# Patient Record
Sex: Male | Born: 1945
Health system: Southern US, Community
[De-identification: ages and names within clinical notes are randomized; demographics above are authoritative.]

## PROBLEM LIST (undated history)

## (undated) ENCOUNTER — Emergency Department: Payer: No Typology Code available for payment source

## (undated) DIAGNOSIS — I4891 Unspecified atrial fibrillation: Secondary | ICD-10-CM

## (undated) DIAGNOSIS — I509 Heart failure, unspecified: Secondary | ICD-10-CM

## (undated) DIAGNOSIS — J449 Chronic obstructive pulmonary disease, unspecified: Secondary | ICD-10-CM

## (undated) DIAGNOSIS — Z951 Presence of aortocoronary bypass graft: Secondary | ICD-10-CM

## (undated) DIAGNOSIS — K7460 Unspecified cirrhosis of liver: Secondary | ICD-10-CM

## (undated) DIAGNOSIS — I10 Essential (primary) hypertension: Secondary | ICD-10-CM

---

## 2007-12-04 ENCOUNTER — Emergency Department: Payer: Self-pay | Admitting: Emergency Medicine

## 2007-12-04 ENCOUNTER — Other Ambulatory Visit: Payer: Self-pay

## 2008-09-25 ENCOUNTER — Encounter: Payer: Self-pay | Admitting: Internal Medicine

## 2008-09-25 LAB — CONVERTED CEMR LAB
Cholesterol: 155 mg/dL
HDL: 60 mg/dL
LDL Cholesterol: 83 mg/dL

## 2008-12-11 ENCOUNTER — Encounter: Payer: Self-pay | Admitting: Internal Medicine

## 2008-12-11 LAB — CONVERTED CEMR LAB
Creatinine, Ser: 1 mg/dL
Glucose, Bld: 101 mg/dL
HCT: 42.1 %
Hemoglobin: 14.1 g/dL
Platelets: 137 10*3/uL
WBC: 5.2 10*3/uL

## 2009-05-07 ENCOUNTER — Emergency Department: Payer: Self-pay | Admitting: Emergency Medicine

## 2009-09-08 ENCOUNTER — Ambulatory Visit: Payer: Self-pay | Admitting: Internal Medicine

## 2009-09-08 ENCOUNTER — Inpatient Hospital Stay: Payer: Self-pay | Admitting: Internal Medicine

## 2010-02-12 ENCOUNTER — Encounter: Payer: Self-pay | Admitting: Internal Medicine

## 2010-02-12 DIAGNOSIS — E785 Hyperlipidemia, unspecified: Secondary | ICD-10-CM | POA: Insufficient documentation

## 2010-02-12 DIAGNOSIS — I10 Essential (primary) hypertension: Secondary | ICD-10-CM | POA: Insufficient documentation

## 2013-05-03 ENCOUNTER — Emergency Department: Payer: Self-pay | Admitting: Emergency Medicine

## 2013-05-03 LAB — CK TOTAL AND CKMB (NOT AT ARMC)
CK, Total: 199 U/L (ref 35–232)
CK, Total: 173 U/L (ref 35–232)
CK-MB: 3.7 ng/mL — ABNORMAL HIGH (ref 0.5–3.6)
CK-MB: 3.2 ng/mL (ref 0.5–3.6)

## 2013-05-03 LAB — PROTIME-INR: Prothrombin Time: 14.2 secs (ref 11.5–14.7)

## 2013-05-03 LAB — CBC
RBC: 3.91 10*6/uL — ABNORMAL LOW (ref 4.40–5.90)
RDW: 13 % (ref 11.5–14.5)

## 2013-05-03 LAB — BASIC METABOLIC PANEL
BUN: 8 mg/dL (ref 7–18)
Co2: 27 mmol/L (ref 21–32)
Creatinine: 0.88 mg/dL (ref 0.60–1.30)
EGFR (Non-African Amer.): >60
Osmolality: 265 (ref 275–301)

## 2013-05-03 LAB — APTT: Activated PTT: 33 secs (ref 23.6–35.9)

## 2015-06-17 ENCOUNTER — Encounter: Payer: Self-pay | Admitting: Adult Health

## 2015-06-17 ENCOUNTER — Emergency Department: Payer: Medicare Other

## 2015-06-17 ENCOUNTER — Emergency Department
Admission: EM | Admit: 2015-06-17 | Discharge: 2015-06-17 | Disposition: A | Discharge status: Home / Self Care | Payer: Medicare Other | Attending: Emergency Medicine | Admitting: Emergency Medicine

## 2015-06-17 DIAGNOSIS — Z7982 Long term (current) use of aspirin: Secondary | ICD-10-CM | POA: Insufficient documentation

## 2015-06-17 DIAGNOSIS — J209 Acute bronchitis, unspecified: Secondary | ICD-10-CM | POA: Diagnosis not present

## 2015-06-17 DIAGNOSIS — I10 Essential (primary) hypertension: Secondary | ICD-10-CM | POA: Diagnosis not present

## 2015-06-17 DIAGNOSIS — Z79899 Other long term (current) drug therapy: Secondary | ICD-10-CM | POA: Diagnosis not present

## 2015-06-17 DIAGNOSIS — Z7902 Long term (current) use of antithrombotics/antiplatelets: Secondary | ICD-10-CM | POA: Insufficient documentation

## 2015-06-17 DIAGNOSIS — R0789 Other chest pain: Secondary | ICD-10-CM

## 2015-06-17 DIAGNOSIS — R079 Chest pain, unspecified: Secondary | ICD-10-CM | POA: Diagnosis present

## 2015-06-17 HISTORY — DX: Essential (primary) hypertension: I10

## 2015-06-17 HISTORY — DX: Presence of aortocoronary bypass graft: Z95.1

## 2015-06-17 HISTORY — DX: Heart failure, unspecified: I50.9

## 2015-06-17 LAB — HEPATIC FUNCTION PANEL
ALT: 36 U/L (ref 17–63)
AST: 45 U/L — ABNORMAL HIGH (ref 15–41)
Albumin: 4 g/dL (ref 3.5–5.0)
Alkaline Phosphatase: 64 U/L (ref 38–126)
BILIRUBIN INDIRECT: 1 mg/dL — AB (ref 0.3–0.9)
Bilirubin, Direct: 0.2 mg/dL (ref 0.1–0.5)
TOTAL PROTEIN: 7.5 g/dL (ref 6.5–8.1)
Total Bilirubin: 1.2 mg/dL (ref 0.3–1.2)

## 2015-06-17 LAB — TROPONIN I: Troponin I: <0.03 ng/mL (ref <0.031)

## 2015-06-17 LAB — PROTIME-INR
INR: 1.08
PROTHROMBIN TIME: 14.2 s (ref 11.4–15.0)

## 2015-06-17 LAB — CBC
HCT: 43.5 % (ref 40.0–52.0)
Hemoglobin: 14.8 g/dL (ref 13.0–18.0)
MCH: 36 pg — ABNORMAL HIGH (ref 26.0–34.0)
MCHC: 33.9 g/dL (ref 32.0–36.0)
MCV: 106.2 fL — ABNORMAL HIGH (ref 80.0–100.0)
PLATELETS: 151 10*3/uL (ref 150–440)
RBC: 4.1 MIL/uL — AB (ref 4.40–5.90)
RDW: 13 % (ref 11.5–14.5)
WBC: 6.4 10*3/uL (ref 3.8–10.6)

## 2015-06-17 LAB — BASIC METABOLIC PANEL
Anion gap: 6 (ref 5–15)
BUN: 8 mg/dL (ref 6–20)
CALCIUM: 9.1 mg/dL (ref 8.9–10.3)
CHLORIDE: 98 mmol/L — AB (ref 101–111)
CO2: 26 mmol/L (ref 22–32)
CREATININE: 0.86 mg/dL (ref 0.61–1.24)
Glucose, Bld: 102 mg/dL — ABNORMAL HIGH (ref 65–99)
Potassium: 4.1 mmol/L (ref 3.5–5.1)
SODIUM: 130 mmol/L — AB (ref 135–145)

## 2015-06-17 LAB — MAGNESIUM: MAGNESIUM: 1.9 mg/dL (ref 1.7–2.4)

## 2015-06-17 MED ORDER — ASPIRIN 81 MG PO CHEW
243.0000 mg | CHEWABLE_TABLET | Freq: Once | ORAL | Status: AC
  Administered 2015-06-17: 243 mg via ORAL
  Filled 2015-06-17: qty 3

## 2015-06-17 MED ORDER — PREDNISONE 20 MG PO TABS: 40.0000 mg | ORAL_TABLET | Freq: Every day | ORAL | Status: DC

## 2015-06-17 NOTE — ED Provider Notes (Signed)
[facility] Emergency Department Provider Note  ____________________________________________  Time seen: Approximately 4:16 AM  I have reviewed the triage vital signs and the nursing notes.   HISTORY  Chief Complaint Chest Pain and Hypertension    HPI Kenneth Stevenson is a 69 y.o. male with an extensive cardiac history and who has outpatient primary care and cardiologist whose most recent stents/catheterization occurred about 2 years ago presents with substernal chest pressure that began several hours ago after eating.  He states it radiates into the left side of his chest and that he has had some nausea and shortness of breath associated with the pain.  He noticed that he was hypertensive at home when he took some nitroglycerin as well as his regular lisinopril.  He feels better at this time with very minimal if any symptoms.  He describes his chest discomfort as moderate, nothing that it better and nothing made it worse.  It was a gradual onset.  He also endorses a recent cough and congestion.He has never smoked and does not have a diagnosis of COPD.  He denies fever/chills, abdominal pain, dysuria.   Past Medical History  Diagnosis Date  . S/P CABG x 3   . Hypertension   . CHF (congestive heart failure) [facility])     Patient Active Problem List   Diagnosis Date Noted  . HYPERLIPIDEMIA-MIXED 02/12/2010  . HYPERTENSION, UNSPECIFIED 02/12/2010    History reviewed. No pertinent past surgical history.  Current Outpatient Rx  Name  Route  Sig  Dispense  Refill  . aspirin 81 MG tablet   Oral   Take 81 mg by mouth daily.         . carvedilol (COREG) 12.5 MG tablet   Oral   Take 12.5 mg by mouth 2 (two) times daily with a meal.         . clopidogrel (PLAVIX) 75 MG tablet   Oral   Take 75 mg by mouth daily.         Marland Kitchen lisinopril (PRINIVIL,ZESTRIL) 40 MG tablet   Oral   Take 40 mg by mouth daily.         . pantoprazole (PROTONIX) 40 MG tablet  Oral   Take 40 mg by mouth daily.         Marland Kitchen pyridOXINE (VITAMIN B-6) 100 MG tablet   Oral   Take 100 mg by mouth daily.         Marland Kitchen Specialty Vitamins Products (MAGNESIUM, AMINO ACID CHELATE,) 133 MG tablet   Oral   Take 1 tablet by mouth 2 (two) times daily.         . vitamin B-12 (CYANOCOBALAMIN) 1000 MCG tablet   Oral   Take 1,000 mcg by mouth daily.         . predniSONE (DELTASONE) 20 MG tablet   Oral   Take 2 tablets (40 mg total) by mouth daily.   10 tablet   0     Allergies Simvastatin  History reviewed. No pertinent family history.  Social History Social History  Substance Use Topics  . Smoking status: Never Smoker   . Smokeless tobacco: None  . Alcohol Use: Yes    Review of Systems Constitutional: No fever/chills Eyes: No visual changes. ENT: No sore throat. Cardiovascular: Substernal chest pressure that started several hours ago Respiratory: Mild shortness of breath, now resolved.  Recent cough. Gastrointestinal: No abdominal pain.  No nausea, no vomiting.  No diarrhea.  No constipation. Genitourinary: Negative for  dysuria. Musculoskeletal: Negative for back pain. Skin: Negative for rash. Neurological: Negative for headaches, focal weakness or numbness.  10-point ROS otherwise negative.  ____________________________________________   PHYSICAL EXAM:  VITAL SIGNS: ED Triage Vitals  Enc Vitals Group     BP 06/17/15 0037 204/85 mmHg     Pulse Rate 06/17/15 0037 78     Resp 06/17/15 0037 18     Temp 06/17/15 0037 98.1 F (36.7 C)     Temp Source 06/17/15 0037 Oral     SpO2 06/17/15 0037 95 %     Weight 06/17/15 0037 185 lb (83.915 kg)     Height 06/17/15 0037 5\' 11"  (1.803 m)     Head Cir --      Peak Flow --      Pain Score 06/17/15 0051 8     Pain Loc --      Pain Edu? --      Excl. in [place]? --     Constitutional: Alert and oriented. Well appearing and in no acute distress. Eyes: Conjunctivae are normal. PERRL. EOMI. Head:  Atraumatic. Nose: No congestion/rhinnorhea. Mouth/Throat: Mucous membranes are moist.  Oropharynx non-erythematous. Neck: No stridor.   Cardiovascular: Normal rate, regular rhythm. Grossly normal heart sounds.  Good peripheral circulation. Respiratory: Normal respiratory effort.  No retractions. Lungs CTAB. Gastrointestinal: Soft and nontender. No distention. No abdominal bruits. No CVA tenderness. Musculoskeletal: No lower extremity tenderness nor edema.  No joint effusions. Neurologic:  Normal speech and language. No gross focal neurologic deficits are appreciated.  Skin:  Skin is warm, dry and intact. No rash noted. Psychiatric: Mood and affect are normal. Speech and behavior are normal.  ____________________________________________   LABS (all labs ordered are listed, but only abnormal results are displayed)  Labs Reviewed  BASIC METABOLIC PANEL - Abnormal; Notable for the following:    Sodium 130 (*)    Chloride 98 (*)    Glucose, Bld 102 (*)    All other components within normal limits  CBC - Abnormal; Notable for the following:    RBC 4.10 (*)    MCV 106.2 (*)    MCH 36.0 (*)    All other components within normal limits  HEPATIC FUNCTION PANEL - Abnormal; Notable for the following:    AST 45 (*)    Indirect Bilirubin 1.0 (*)    All other components within normal limits  TROPONIN I  PROTIME-INR  MAGNESIUM  TROPONIN I   ____________________________________________  EKG  ED ECG REPORT I, Deepti Gunawan, the attending physician, personally viewed and interpreted this ECG.  Date: 06/17/2015 EKG Time: 00:31 Rate: 75 Rhythm: normal sinus rhythm QRS Axis: normal Intervals: normal ST/T Wave abnormalities: normal Conduction Disutrbances: none Narrative Interpretation: unremarkable  ____________________________________________  RADIOLOGY   Dg Chest 2 View  06/17/2015  CLINICAL DATA:  Sternal chest pressure. Nausea and shortness of breath. Onset 6 hours prior.  EXAM: CHEST  2 VIEW COMPARISON:  05/03/2013 FINDINGS: Patient is post median sternotomy. The heart size is normal, there is unchanged tortuosity of thoracic aorta. Lungs are hyperinflated. There is new bronchial thickening. No pulmonary edema. No consolidation, pleural effusion or pneumothorax. No acute osseous abnormalities. IMPRESSION: Bronchial thickening, suggesting acute bronchitis. Electronically Signed   By: Jeb Levering M.D.   On: 06/17/2015 01:19    ____________________________________________   PROCEDURES  Procedure(s) performed: None  Critical Care performed: No ____________________________________________   INITIAL IMPRESSION / ASSESSMENT AND PLAN / ED COURSE  Pertinent labs & imaging results that were  available during my care of the patient were reviewed by me and considered in my medical decision making (see chart for details).  The patient has a substantial cardiac history but is well-appearing at this time and asymptomatic.  He has had relatively recent workups including a catheterization within the last couple of years.  His EKG is unremarkable and he has had 2 negative troponins.  I discussed with the patient the possibility of being transferred to the [facility] for chest pain observation or that he can go home with follow-up with his cardiologist as an outpatient, and he prefers to do the latter which I think is appropriate under the circumstances.  I gave the patient and his wife my usual customary return precautions and they understand and agree with the plan.  I am treating his acute bronchitis with some prednisone but avoiding antibiotic therapy given the unlikelihood that he has a bacterial infection that would benefit from antibiotics.  He already has albuterol at home.  ____________________________________________  FINAL CLINICAL IMPRESSION(S) / ED DIAGNOSES  Final diagnoses:  Atypical chest pain  Acute bronchitis, unspecified organism      NEW MEDICATIONS  STARTED DURING THIS VISIT:  Discharge Medication List as of 06/17/2015  4:52 AM    START taking these medications   Details  predniSONE (DELTASONE) 20 MG tablet Take 2 tablets (40 mg total) by mouth daily., Starting 06/17/2015, Until Discontinued, Print         Hinda Kehr, MD 06/17/15 [phone]

## 2015-06-17 NOTE — Discharge Instructions (Signed)
You have been seen in the Emergency Department (ED) today for chest pain.  As we have discussed todays test results are normal, but you may require further testing.  We are also giving you a short course of steroids for your bronchitis.  Continue using all your regular medications as well.  Please follow up with the recommended doctor as instructed above in these documents regarding todays emergent visit and your recent symptoms to discuss further management.  Continue to take your regular medications. If you are not doing so already, please also take a daily baby aspirin (81 mg), at least until you follow up with your doctor.  Return to the Emergency Department (ED) if you experience any further chest pain/pressure/tightness, difficulty breathing, or sudden sweating, or other symptoms that concern you.   Chest Pain (Nonspecific) It is often hard to give a specific diagnosis for the cause of chest pain. There is always a chance that your pain could be related to something serious, such as a heart attack or a blood clot in the lungs. You need to follow up with your health care provider for further evaluation. CAUSES   Heartburn.  Pneumonia or bronchitis.  Anxiety or stress.  Inflammation around your heart (pericarditis) or lung (pleuritis or pleurisy).  A blood clot in the lung.  A collapsed lung (pneumothorax). It can develop suddenly on its own (spontaneous pneumothorax) or from trauma to the chest.  Shingles infection (herpes zoster virus). The chest wall is composed of bones, muscles, and cartilage. Any of these can be the source of the pain.  The bones can be bruised by injury.  The muscles or cartilage can be strained by coughing or overwork.  The cartilage can be affected by inflammation and become sore (costochondritis). DIAGNOSIS  Lab tests or other studies may be needed to find the cause of your pain. Your health care provider may have you take a test called an ambulatory  electrocardiogram (ECG). An ECG records your heartbeat patterns over a 24-hour period. You may also have other tests, such as:  Transthoracic echocardiogram (TTE). During echocardiography, sound waves are used to evaluate how blood flows through your heart.  Transesophageal echocardiogram (TEE).  Cardiac monitoring. This allows your health care provider to monitor your heart rate and rhythm in real time.  Holter monitor. This is a portable device that records your heartbeat and can help diagnose heart arrhythmias. It allows your health care provider to track your heart activity for several days, if needed.  Stress tests by exercise or by giving medicine that makes the heart beat faster. TREATMENT   Treatment depends on what may be causing your chest pain. Treatment may include:  Acid blockers for heartburn.  Anti-inflammatory medicine.  Pain medicine for inflammatory conditions.  Antibiotics if an infection is present.  You may be advised to change lifestyle habits. This includes stopping smoking and avoiding alcohol, caffeine, and chocolate.  You may be advised to keep your head raised (elevated) when sleeping. This reduces the chance of acid going backward from your stomach into your esophagus. Most of the time, nonspecific chest pain will improve within 2-3 days with rest and mild pain medicine.  HOME CARE INSTRUCTIONS   If antibiotics were prescribed, take them as directed. Finish them even if you start to feel better.  For the next few days, avoid physical activities that bring on chest pain. Continue physical activities as directed.  Do not use any tobacco products, including cigarettes, chewing tobacco, or electronic cigarettes.  Avoid drinking alcohol.  Only take medicine as directed by your health care provider.  Follow your health care provider's suggestions for further testing if your chest pain does not go away.  Keep any follow-up appointments you made. If you do  not go to an appointment, you could develop lasting (chronic) problems with pain. If there is any problem keeping an appointment, call to reschedule. SEEK MEDICAL CARE IF:   Your chest pain does not go away, even after treatment.  You have a rash with blisters on your chest.  You have a fever. SEEK IMMEDIATE MEDICAL CARE IF:   You have increased chest pain or pain that spreads to your arm, neck, jaw, back, or abdomen.  You have shortness of breath.  You have an increasing cough, or you cough up blood.  You have severe back or abdominal pain.  You feel nauseous or vomit.  You have severe weakness.  You faint.  You have chills. This is an emergency. Do not wait to see if the pain will go away. Get medical help at once. Call your local emergency services (911 in [place] Do not drive yourself to the hospital. MAKE SURE YOU:   Understand these instructions.  Will watch your condition.  Will get help right away if you are not doing well or get worse. Document Released: 05/21/2005 Document Revised: 08/16/2013 Document Reviewed: 03/16/2008 [facility] Patient Information 2015 [place], [place]. This information is not intended to replace advice given to you by your health care provider. Make sure you discuss any questions you have with your health care provider.

## 2015-06-17 NOTE — ED Notes (Addendum)
Presents with sternal chest pressure began at 5 pm after eating radiating into left side of chest, associated with nausea, SOB and slight dizziness. Pt is hypertensive 200s/80s. Alert, oriented. Chest pain described as congested at this time and worse with breathing.  Endorses cough. Took 1 nitro at home with mild relief of symptoms also took lisinopril.

## 2015-11-13 ENCOUNTER — Encounter: Payer: Self-pay | Admitting: Emergency Medicine

## 2015-11-13 DIAGNOSIS — Z7902 Long term (current) use of antithrombotics/antiplatelets: Secondary | ICD-10-CM | POA: Diagnosis not present

## 2015-11-13 DIAGNOSIS — Z79899 Other long term (current) drug therapy: Secondary | ICD-10-CM | POA: Insufficient documentation

## 2015-11-13 DIAGNOSIS — R21 Rash and other nonspecific skin eruption: Secondary | ICD-10-CM | POA: Diagnosis present

## 2015-11-13 DIAGNOSIS — L42 Pityriasis rosea: Secondary | ICD-10-CM | POA: Insufficient documentation

## 2015-11-13 DIAGNOSIS — Z7982 Long term (current) use of aspirin: Secondary | ICD-10-CM | POA: Diagnosis not present

## 2015-11-13 DIAGNOSIS — I10 Essential (primary) hypertension: Secondary | ICD-10-CM | POA: Insufficient documentation

## 2015-11-13 NOTE — ED Notes (Addendum)
Pt presents to ED with itchy rash to his extremities and his trunk; onset around 3 weeks ago. Pt states the rash has worsened since onset. Seen by the [facility] cardiologist last week and pt was told to show rash to his pcp. Pt concerned by how rapidly the rash is worsening. Denies fever. Pt reports he had recently taken steroids for his cough (approx 1 month ago) but denies any other new medications or antibiotics. Has been applying benadryl topically but pt states it only helps for a little while. Denies any other symptoms.

## 2015-11-14 ENCOUNTER — Emergency Department
Admission: EM | Admit: 2015-11-14 | Discharge: 2015-11-14 | Disposition: A | Discharge status: Home / Self Care | Payer: Medicare Other | Attending: Emergency Medicine | Admitting: Emergency Medicine

## 2015-11-14 DIAGNOSIS — L42 Pityriasis rosea: Secondary | ICD-10-CM

## 2015-11-14 MED ORDER — DIPHENHYDRAMINE HCL 25 MG PO CAPS
50.0000 mg | ORAL_CAPSULE | Freq: Once | ORAL | Status: AC
  Administered 2015-11-14: 50 mg via ORAL
  Filled 2015-11-14: qty 2

## 2015-11-14 MED ORDER — PREDNISONE 20 MG PO TABS
60.0000 mg | ORAL_TABLET | Freq: Once | ORAL | Status: AC
  Administered 2015-11-14: 60 mg via ORAL
  Filled 2015-11-14: qty 3

## 2015-11-14 MED ORDER — CEPHALEXIN 500 MG PO CAPS: 500.0000 mg | ORAL_CAPSULE | Freq: Two times a day (BID) | ORAL | Status: AC

## 2015-11-14 MED ORDER — CEPHALEXIN 500 MG PO CAPS
500.0000 mg | ORAL_CAPSULE | Freq: Once | ORAL | Status: AC
  Administered 2015-11-14: 500 mg via ORAL
  Filled 2015-11-14: qty 1

## 2015-11-14 MED ORDER — PREDNISONE 20 MG PO TABS: 60.0000 mg | ORAL_TABLET | Freq: Every day | ORAL | Status: AC

## 2015-11-14 NOTE — ED Provider Notes (Signed)
[facility] Emergency Department Provider Note  ____________________________________________  Time seen: 2:15 AM  I have reviewed the triage vital signs and the nursing notes.   HISTORY  Chief Complaint Rash     HPI Kenneth Stevenson is a 70 y.o. male  presents with generalize pruritic rash 3 weeks with worsening over the past 2 weeks. Patient denies any fever patient was seen at the [facility] and referred to his primary care provider but does not have an appointment until the 28th of this month. Patient admits to using topical Benadryl cream with improvement however not sustained.    Past Medical History  Diagnosis Date  . S/P CABG x 3   . Hypertension   . CHF (congestive heart failure) [facility])     Patient Active Problem List   Diagnosis Date Noted  . HYPERLIPIDEMIA-MIXED 02/12/2010  . HYPERTENSION, UNSPECIFIED 02/12/2010    No past surgical history on file.  Current Outpatient Rx  Name  Route  Sig  Dispense  Refill  . aspirin 81 MG tablet   Oral   Take 81 mg by mouth daily.         . carvedilol (COREG) 12.5 MG tablet   Oral   Take 12.5 mg by mouth 2 (two) times daily with a meal.         . clopidogrel (PLAVIX) 75 MG tablet   Oral   Take 75 mg by mouth daily.         Marland Kitchen lisinopril (PRINIVIL,ZESTRIL) 40 MG tablet   Oral   Take 40 mg by mouth daily.         . predniSONE (DELTASONE) 20 MG tablet   Oral   Take 2 tablets (40 mg total) by mouth daily.   10 tablet   0   . pyridOXINE (VITAMIN B-6) 100 MG tablet   Oral   Take 100 mg by mouth daily.         Marland Kitchen Specialty Vitamins Products (MAGNESIUM, AMINO ACID CHELATE,) 133 MG tablet   Oral   Take 1 tablet by mouth 2 (two) times daily.         . vitamin B-12 (CYANOCOBALAMIN) 1000 MCG tablet   Oral   Take 1,000 mcg by mouth daily.           Allergies Simvastatin  No family history on file.  Social History Social History  Substance Use Topics  . Smoking status: Never Smoker    . Smokeless tobacco: Never Used  . Alcohol Use: Yes    Review of Systems  Constitutional: Negative for fever. Eyes: Negative for visual changes. ENT: Negative for sore throat. Cardiovascular: Negative for chest pain. Respiratory: Negative for shortness of breath. Gastrointestinal: Negative for abdominal pain, vomiting and diarrhea. Genitourinary: Negative for dysuria. Musculoskeletal: Negative for back pain. Skinpositive rash. Neurological: Negative for headaches, focal weakness or numbness.   10-point ROS otherwise negative.  ____________________________________________   PHYSICAL EXAM:  VITAL SIGNS: ED Triage Vitals  Enc Vitals Group     BP 11/13/15 2308 183/82 mmHg     Pulse Rate 11/13/15 2308 82     Resp 11/13/15 2308 17     Temp 11/13/15 2308 98 F (36.7 C)     Temp Source 11/13/15 2308 Oral     SpO2 11/13/15 2308 95 %     Weight 11/13/15 2308 185 lb (83.915 kg)     Height 11/13/15 2308 5\' 11"  (1.803 m)     Head Cir --  Peak Flow --      Pain Score 11/13/15 2308 2     Pain Loc --      Pain Edu? --      Excl. in [place]? --      Constitutional: Alert and oriented. Well appearing and in no distress. Eyes: Conjunctivae are normal. PERRL. Normal extraocular movements. ENT   Head: Normocephalic and atraumatic.   Nose: No congestion/rhinnorhea.   Mouth/Throat: Mucous membranes are moist.   Neck: No stridor. Hematological/Lymphatic/Immunilogical: No cervical lymphadenopathy. Cardiovascular: Normal rate, regular rhythm. Normal and symmetric distal pulses are present in all extremities. No murmurs, rubs, or gallops. Respiratory: Normal respiratory effort without tachypnea nor retractions. Breath sounds are clear and equal bilaterally. No wheezes/rales/rhonchi. Gastrointestinal: Soft and nontender. No distention. There is no CVA tenderness. Genitourinary: deferred Musculoskeletal: Nontender with normal range of motion in all extremities. No joint  effusions.  No lower extremity tenderness nor edema. Neurologic:  Normal speech and language. No gross focal neurologic deficits are appreciated. Speech is normal.  Skin:  generalized plaque-like rash involving trunk and bilateral extremities herald patch noted right inner thigh evidence of excoriation diffusely  Psychiatric: Mood and affect are normal. Speech and behavior are normal. Patient exhibits appropriate insight and judgment.    INITIAL IMPRESSION / ASSESSMENT AND PLAN / ED COURSE  Pertinent labs & imaging results that were available during my care of the patient were reviewed by me and considered in my medical decision making (see chart for details).  History physical exam concerning for possible pityriasis rosacea given pruritus patient given prednisone 60 mg as well as Benadryl 50 mg will be prescribed same for home patient will be referred to Dr. Annett Fabian dermatologist for outpatient follow-up   ____________________________________________   FINAL CLINICAL IMPRESSION(S) / ED DIAGNOSES  Final diagnoses:  Pityriasis rosea      Gregor Hams, MD 11/14/15 [phone]

## 2015-11-14 NOTE — Discharge Instructions (Signed)
Pityriasis Rosea  Pityriasis rosea is a rash that usually appears on the trunk of the body. It may also appear on the upper arms and upper legs. It usually begins as a single patch, and then more patches begin to develop. The rash may cause mild itching, but it normally does not cause other problems. It usually goes away without treatment. However, it may take weeks or months for the rash to go away completely.  CAUSES  The cause of this condition is not known. The condition does not spread from person to person (is noncontagious).  RISK FACTORS  This condition is more likely to develop in young adults and children. It is most common in the spring and fall.  SYMPTOMS  The main symptom of this condition is a rash.  · The rash usually begins with a single oval patch that is larger than the ones that follow. This is called a herald patch. It generally appears a week or more before the rest of the rash appears.  · When more patches start to develop, they spread quickly on the trunk, back, and arms. These patches are smaller than the first one.  · The patches that make up the rash are usually oval-shaped and pink or red in color. They are usually flat, but they may sometimes be raised so that they can be felt with a finger. They may also be finely crinkled and have a scaly ring around the edge.  · The rash does not typically appear on areas of the skin that are exposed to the sun.  Most people who have this condition do not have other symptoms, but some have mild itching. In a few cases, a mild headache or body aches may occur before the rash appears and then go away.  DIAGNOSIS  Your health care provider may diagnose this condition by doing a physical exam and taking your medical history. To rule out other possible causes for the rash, the health care provider may order blood tests or take a skin sample from the rash to be looked at under a microscope.  TREATMENT  Usually, treatment is not needed for this condition. The  rash will probably go away on its own in 4-8 weeks. In some cases, a health care provider may recommend or prescribe medicine to reduce itching.  HOME CARE INSTRUCTIONS  · Take medicines only as directed by your health care provider.  · Avoid scratching the affected areas of skin.  · Do not take hot baths or use a sauna. Use only warm water when bathing or showering. Heat can increase itching.  SEEK MEDICAL CARE IF:  · Your rash does not go away in 8 weeks.  · Your rash gets much worse.  · You have a fever.  · You have swelling or pain in the rash area.  · You have fluid, blood, or pus coming from the rash area.     This information is not intended to replace advice given to you by your health care provider. Make sure you discuss any questions you have with your health care provider.     Document Released: 09/17/2001 Document Revised: 12/26/2014 Document Reviewed: 07/19/2014  Elsevier Interactive Patient Education ©2016 [employer]

## 2016-09-13 ENCOUNTER — Emergency Department
Admission: EM | Admit: 2016-09-13 | Discharge: 2016-09-13 | Disposition: A | Discharge status: Home / Self Care | Payer: PPO | Attending: Emergency Medicine | Admitting: Emergency Medicine

## 2016-09-13 ENCOUNTER — Encounter: Payer: Self-pay | Admitting: Emergency Medicine

## 2016-09-13 DIAGNOSIS — M10062 Idiopathic gout, left knee: Secondary | ICD-10-CM | POA: Insufficient documentation

## 2016-09-13 DIAGNOSIS — M109 Gout, unspecified: Secondary | ICD-10-CM

## 2016-09-13 DIAGNOSIS — I110 Hypertensive heart disease with heart failure: Secondary | ICD-10-CM | POA: Insufficient documentation

## 2016-09-13 DIAGNOSIS — I509 Heart failure, unspecified: Secondary | ICD-10-CM | POA: Diagnosis not present

## 2016-09-13 DIAGNOSIS — Z79899 Other long term (current) drug therapy: Secondary | ICD-10-CM | POA: Diagnosis not present

## 2016-09-13 DIAGNOSIS — Z7982 Long term (current) use of aspirin: Secondary | ICD-10-CM | POA: Diagnosis not present

## 2016-09-13 DIAGNOSIS — R21 Rash and other nonspecific skin eruption: Secondary | ICD-10-CM | POA: Insufficient documentation

## 2016-09-13 DIAGNOSIS — M25562 Pain in left knee: Secondary | ICD-10-CM | POA: Diagnosis not present

## 2016-09-13 MED ORDER — COLCHICINE 0.6 MG PO TABS: ORAL_TABLET | ORAL | 0 refills | Status: DC

## 2016-09-13 MED ORDER — HYDROCODONE-ACETAMINOPHEN 5-325 MG PO TABS: 1.0000 | ORAL_TABLET | ORAL | 0 refills | Status: DC | PRN

## 2016-09-13 MED ORDER — TRIAMCINOLONE ACETONIDE 0.5 % EX OINT: 1.0000 "application " | TOPICAL_OINTMENT | Freq: Two times a day (BID) | CUTANEOUS | 0 refills | Status: DC

## 2016-09-13 NOTE — Discharge Instructions (Signed)
Follow-up with the [facility] for any continued problems with your gout and also with the rash. Begin taking colchicine as directed until relief of your gout or stomach upset. Discontinue medication if there is diarrhea. There is also a prescription for hydrocodone if needed for severe pain. Kenalog is to be used for your rash twice a day.

## 2016-09-13 NOTE — ED Notes (Signed)
Pt verbalized understanding of discharge instructions. NAD at this time. 

## 2016-09-13 NOTE — ED Triage Notes (Signed)
Reports rash on hands.  Dry patches noted.  also states his "gout is acting up", pain in left knee and right foot. Ambulates well.

## 2016-09-13 NOTE — ED Provider Notes (Signed)
[facility] Emergency Department Provider Note  ____________________________________________   First MD Initiated Contact with Patient 09/13/16 1003     (approximate)  I have reviewed the triage vital signs and the nursing notes.   HISTORY  Chief Complaint Rash and Knee Pain   HPI Kenneth Stevenson is a 71 y.o. male is here with complaint of left knee pain and a rash on his hands. Patient states that his "gout is acting up". He states that his left knee has had gout in it and he has been experiencing pain and swelling for almost a week. He states he had some pain medication that belonged to his daughter and has been taking half a tablet once a day without any relief of his knee pain. He also has dry patches on his hands and his back that have been itching. They've been using over-the-counter hand lotion without any relief of his symptoms. Eyes any fever or chills. In the past he has taken colchicine with some stomach discomfort occasionally but not always. Currently he is being seen at the [facility] but does not have an appointment for several weeks. He rates his pain as a 6 out of 10.   Past Medical History:  Diagnosis Date  . CHF (congestive heart failure) ([place])   . Hypertension   . S/P CABG x 3     Patient Active Problem List   Diagnosis Date Noted  . HYPERLIPIDEMIA-MIXED 02/12/2010  . HYPERTENSION, UNSPECIFIED 02/12/2010    History reviewed. No pertinent surgical history.  Prior to Admission medications   Medication Sig Start Date End Date Taking? Authorizing Provider  albuterol (PROVENTIL HFA;VENTOLIN HFA) 108 (90 Base) MCG/ACT inhaler Inhale 1 puff into the lungs every 6 (six) hours as needed for wheezing or shortness of breath.    Historical Provider, MD  amLODipine (NORVASC) 5 MG tablet Take 5 mg by mouth daily.    Historical Provider, MD  aspirin 81 MG tablet Take 81 mg by mouth daily.    Historical Provider, MD  atorvastatin (LIPITOR) 80 MG  tablet Take 80 mg by mouth daily.    Historical Provider, MD  carvedilol (COREG) 12.5 MG tablet Take 12.5 mg by mouth 2 (two) times daily with a meal.    Historical Provider, MD  clopidogrel (PLAVIX) 75 MG tablet Take 75 mg by mouth daily.    Historical Provider, MD  colchicine 0.6 MG tablet 2 tablets stat, then 1 tablet tid until relief or stomach upset 09/13/16 09/20/16  Johnn Hai, PA-C  HYDROcodone-acetaminophen (NORCO/VICODIN) 5-325 MG tablet Take 1 tablet by mouth every 4 (four) hours as needed for moderate pain. 09/13/16   Johnn Hai, PA-C  lisinopril (PRINIVIL,ZESTRIL) 40 MG tablet Take 40 mg by mouth daily.    Historical Provider, MD  predniSONE (DELTASONE) 20 MG tablet Take 2 tablets (40 mg total) by mouth daily. 06/17/15   Hinda Kehr, MD  pyridOXINE (VITAMIN B-6) 100 MG tablet Take 100 mg by mouth daily.    Historical Provider, MD  Specialty Vitamins Products (MAGNESIUM, AMINO ACID CHELATE,) 133 MG tablet Take 1 tablet by mouth 2 (two) times daily.    Historical Provider, MD  triamcinolone ointment (KENALOG) 0.5 % Apply 1 application topically 2 (two) times daily. 09/13/16   Johnn Hai, PA-C  vitamin B-12 (CYANOCOBALAMIN) 1000 MCG tablet Take 1,000 mcg by mouth daily.    Historical Provider, MD    Allergies Simvastatin  No family history on file.  Social History Social  History  Substance Use Topics  . Smoking status: Never Smoker  . Smokeless tobacco: Never Used  . Alcohol use Yes    Review of Systems Constitutional: No fever/chills Cardiovascular: Denies chest pain. Respiratory: Denies shortness of breath. Gastrointestinal: No abdominal pain.  No nausea, no vomiting.  No diarrhea.  Musculoskeletal: Positive for left knee pain. Skin: Positive for rash. Neurological: Negative for headaches, focal weakness or numbness.  10-point ROS otherwise negative.  ____________________________________________   PHYSICAL EXAM:  VITAL SIGNS: ED Triage Vitals    Enc Vitals Group     BP 09/13/16 0925 (!) 157/74     Pulse Rate 09/13/16 0925 78     Resp 09/13/16 0925 16     Temp 09/13/16 0925 97.7 F (36.5 C)     Temp Source 09/13/16 0925 Oral     SpO2 09/13/16 0925 94 %     Weight 09/13/16 0917 188 lb (85.3 kg)     Height 09/13/16 0917 5\' 11"  (1.803 m)     Head Circumference --      Peak Flow --      Pain Score 09/13/16 0918 6     Pain Loc --      Pain Edu? --      Excl. in [place]? --     Constitutional: Alert and oriented. Well appearing and in no acute distress. Eyes: Conjunctivae are normal. PERRL. EOMI. Head: Atraumatic. Nose: No congestion/rhinnorhea. Neck: No stridor.   Cardiovascular: Normal rate, regular rhythm. Grossly normal heart sounds.  Good peripheral circulation. Respiratory: Normal respiratory effort.  No retractions. Lungs CTAB. Musculoskeletal:Examination of left knee is moderately warm to touch and tender. There is no effusion. There is no gross deformity. Range of motion is limited secondary to pain. Neurologic:  Normal speech and language. No gross focal neurologic deficits are appreciated. No gait instability. Skin:  Skin is warm, dry and intact. Eye examination of the hands there is moderate amount of dry scaly skin. No erythema or drainage is noted. There is also diffuse patches on his back but also scaly and dry without drainage or warmth.   Psychiatric: Mood and affect are normal. Speech and behavior are normal.  ____________________________________________   LABS (all labs ordered are listed, but only abnormal results are displayed)  Labs Reviewed - No data to display   PROCEDURES  Procedure(s) performed: None  Procedures  Critical Care performed: No  ____________________________________________   INITIAL IMPRESSION / ASSESSMENT AND PLAN / ED COURSE  Pertinent labs & imaging results that were available during my care of the patient were reviewed by me and considered in my medical decision making (see  chart for details).  Patient was given a prescription for colchicine as he has taken it in the past. He'll start with 2 tablets stat and then one tablet 3 times a day until he gets relief of his knee pain or stomach upset at which time he will discontinue taking medication. He is also given a prescription for Norco as needed for pain 1 every 4 hours. Patient is also to use Kenalog 0.5% cream to the dry areas that are itching. Instructions were to use this twice a day. He will follow-up with the [facility] on his next appointment.    ___________________________________________   FINAL CLINICAL IMPRESSION(S) / ED DIAGNOSES  Final diagnoses:  Acute gout of left knee, unspecified cause  Rash and nonspecific skin eruption      NEW MEDICATIONS STARTED DURING THIS VISIT:  Discharge Medication List as  of 09/13/2016 10:42 AM    START taking these medications   Details  colchicine 0.6 MG tablet 2 tablets stat, then 1 tablet tid until relief or stomach upset, Print    HYDROcodone-acetaminophen (NORCO/VICODIN) 5-325 MG tablet Take 1 tablet by mouth every 4 (four) hours as needed for moderate pain., Starting Sat 09/13/2016, Print    triamcinolone ointment (KENALOG) 0.5 % Apply 1 application topically 2 (two) times daily., Starting Sat 09/13/2016, Print         Note:  This document was prepared using Dragon voice recognition software and may include unintentional dictation errors.    Johnn Hai, PA-C 09/13/16 [place], MD 09/13/16 [phone]

## 2016-12-09 ENCOUNTER — Encounter: Payer: Self-pay | Admitting: Emergency Medicine

## 2016-12-09 DIAGNOSIS — I110 Hypertensive heart disease with heart failure: Secondary | ICD-10-CM | POA: Diagnosis not present

## 2016-12-09 DIAGNOSIS — Y929 Unspecified place or not applicable: Secondary | ICD-10-CM | POA: Insufficient documentation

## 2016-12-09 DIAGNOSIS — Y999 Unspecified external cause status: Secondary | ICD-10-CM | POA: Insufficient documentation

## 2016-12-09 DIAGNOSIS — S45212A Laceration of axillary or brachial vein, left side, initial encounter: Secondary | ICD-10-CM | POA: Diagnosis not present

## 2016-12-09 DIAGNOSIS — Z7982 Long term (current) use of aspirin: Secondary | ICD-10-CM | POA: Diagnosis not present

## 2016-12-09 DIAGNOSIS — Z23 Encounter for immunization: Secondary | ICD-10-CM | POA: Insufficient documentation

## 2016-12-09 DIAGNOSIS — W11XXXA Fall on and from ladder, initial encounter: Secondary | ICD-10-CM | POA: Insufficient documentation

## 2016-12-09 DIAGNOSIS — Y939 Activity, unspecified: Secondary | ICD-10-CM | POA: Diagnosis not present

## 2016-12-09 DIAGNOSIS — I509 Heart failure, unspecified: Secondary | ICD-10-CM | POA: Diagnosis not present

## 2016-12-09 DIAGNOSIS — Z79899 Other long term (current) drug therapy: Secondary | ICD-10-CM | POA: Insufficient documentation

## 2016-12-09 DIAGNOSIS — S41112A Laceration without foreign body of left upper arm, initial encounter: Secondary | ICD-10-CM | POA: Diagnosis not present

## 2016-12-09 NOTE — ED Triage Notes (Signed)
Patient ambulatory to triage with steady gait, without difficulty or distress noted; pt reports slipped on rung on ladder; approx 1/2 lac to right tip of pinkie and approx 2" avulsion noted just under left axillae--no active bleeding to sites; areas dressed with saline soaked gauze

## 2016-12-10 ENCOUNTER — Emergency Department
Admission: EM | Admit: 2016-12-10 | Discharge: 2016-12-10 | Disposition: A | Discharge status: Home / Self Care | Payer: PPO | Attending: Emergency Medicine | Admitting: Emergency Medicine

## 2016-12-10 DIAGNOSIS — S41112A Laceration without foreign body of left upper arm, initial encounter: Secondary | ICD-10-CM

## 2016-12-10 MED ORDER — TETANUS-DIPHTHERIA TOXOIDS TD 5-2 LFU IM INJ: 0.5000 mL | INJECTION | Freq: Once | INTRAMUSCULAR | Status: DC

## 2016-12-10 MED ORDER — TETANUS-DIPHTH-ACELL PERTUSSIS 5-2.5-18.5 LF-MCG/0.5 IM SUSP
0.5000 mL | Freq: Once | INTRAMUSCULAR | Status: AC
Start: 2016-12-10 — End: 2016-12-10
  Administered 2016-12-10: 0.5 mL via INTRAMUSCULAR

## 2016-12-10 MED ORDER — TETANUS-DIPHTH-ACELL PERTUSSIS 5-2.5-18.5 LF-MCG/0.5 IM SUSP
INTRAMUSCULAR | Status: AC
  Administered 2016-12-10: 0.5 mL via INTRAMUSCULAR
  Filled 2016-12-10: qty 0.5

## 2016-12-10 MED ORDER — LIDOCAINE HCL (PF) 1 % IJ SOLN
10.0000 mL | Freq: Once | INTRAMUSCULAR | Status: AC
  Administered 2016-12-10: 10 mL via INTRADERMAL

## 2016-12-10 MED ORDER — LIDOCAINE HCL (PF) 1 % IJ SOLN
INTRAMUSCULAR | Status: AC
  Administered 2016-12-10: 10 mL via INTRADERMAL
  Filled 2016-12-10: qty 15

## 2016-12-10 MED ORDER — CEPHALEXIN 500 MG PO CAPS: 500.0000 mg | ORAL_CAPSULE | Freq: Two times a day (BID) | ORAL | 0 refills | Status: AC

## 2016-12-10 MED ORDER — CEPHALEXIN 500 MG PO CAPS
500.0000 mg | ORAL_CAPSULE | Freq: Once | ORAL | Status: AC
  Administered 2016-12-10: 500 mg via ORAL
  Filled 2016-12-10: qty 1

## 2016-12-12 NOTE — ED Provider Notes (Signed)
[facility] Emergency Department Provider Note   First MD Initiated Contact with Patient 12/10/16 [phone]     (approximate)  I have reviewed the triage vital signs and the nursing notes.   HISTORY  Chief Complaint Laceration   HPI Kenneth Stevenson is a 71 y.o. male presents with left axilla laceration after "slipping on a ladder". Patient states that he injury occurred approximately 10:00 PM tonight. Patient states his current pain score is 3 out of 10. Patient denies any head injury no loss of consciousness.   Past Medical History:  Diagnosis Date  . CHF (congestive heart failure) ([place])   . Hypertension   . S/P CABG x 3     Patient Active Problem List   Diagnosis Date Noted  . HYPERLIPIDEMIA-MIXED 02/12/2010  . HYPERTENSION, UNSPECIFIED 02/12/2010    History reviewed. No pertinent surgical history.  Prior to Admission medications   Medication Sig Start Date End Date Taking? Authorizing Provider  albuterol (PROVENTIL HFA;VENTOLIN HFA) 108 (90 Base) MCG/ACT inhaler Inhale 1 puff into the lungs every 6 (six) hours as needed for wheezing or shortness of breath.    Historical Provider, MD  amLODipine (NORVASC) 5 MG tablet Take 5 mg by mouth daily.    Historical Provider, MD  aspirin 81 MG tablet Take 81 mg by mouth daily.    Historical Provider, MD  atorvastatin (LIPITOR) 80 MG tablet Take 80 mg by mouth daily.    Historical Provider, MD  carvedilol (COREG) 12.5 MG tablet Take 12.5 mg by mouth 2 (two) times daily with a meal.    Historical Provider, MD  cephALEXin (KEFLEX) 500 MG capsule Take 1 capsule (500 mg total) by mouth 2 (two) times daily. 12/10/16 12/20/16  Gregor Hams, MD  clopidogrel (PLAVIX) 75 MG tablet Take 75 mg by mouth daily.    Historical Provider, MD  colchicine 0.6 MG tablet 2 tablets stat, then 1 tablet tid until relief or stomach upset 09/13/16 09/20/16  Johnn Hai, PA-C  HYDROcodone-acetaminophen (NORCO/VICODIN) 5-325 MG tablet  Take 1 tablet by mouth every 4 (four) hours as needed for moderate pain. 09/13/16   Johnn Hai, PA-C  lisinopril (PRINIVIL,ZESTRIL) 40 MG tablet Take 40 mg by mouth daily.    Historical Provider, MD  predniSONE (DELTASONE) 20 MG tablet Take 2 tablets (40 mg total) by mouth daily. 06/17/15   Hinda Kehr, MD  pyridOXINE (VITAMIN B-6) 100 MG tablet Take 100 mg by mouth daily.    Historical Provider, MD  Specialty Vitamins Products (MAGNESIUM, AMINO ACID CHELATE,) 133 MG tablet Take 1 tablet by mouth 2 (two) times daily.    Historical Provider, MD  triamcinolone ointment (KENALOG) 0.5 % Apply 1 application topically 2 (two) times daily. 09/13/16   Johnn Hai, PA-C  vitamin B-12 (CYANOCOBALAMIN) 1000 MCG tablet Take 1,000 mcg by mouth daily.    Historical Provider, MD    Allergies Simvastatin  No family history on file.  Social History Social History  Substance Use Topics  . Smoking status: Never Smoker  . Smokeless tobacco: Never Used  . Alcohol use Yes    Review of Systems Constitutional: No fever/chills Eyes: No visual changes. ENT: No sore throat. Cardiovascular: Denies chest pain. Respiratory: Denies shortness of breath. Gastrointestinal: No abdominal pain.  No nausea, no vomiting.  No diarrhea.  No constipation. Genitourinary: Negative for dysuria. Musculoskeletal: Negative for back pain. Skin: Negative for rash.Positive for left axilla laceration Neurological: Negative for headaches, focal weakness or numbness.  10-point ROS otherwise negative.  ____________________________________________   PHYSICAL EXAM:  VITAL SIGNS: ED Triage Vitals  Enc Vitals Group     BP 12/09/16 2340 (!) 146/78     Pulse Rate 12/09/16 2340 70     Resp 12/09/16 2340 20     Temp 12/09/16 2340 97.6 F (36.4 C)     Temp Source 12/09/16 2340 Oral     SpO2 12/09/16 2340 98 %     Weight 12/09/16 2338 189 lb (85.7 kg)     Height 12/09/16 2338 5\' 11"  (1.803 m)     Head Circumference --       Peak Flow --      Pain Score 12/09/16 2338 10     Pain Loc --      Pain Edu? --      Excl. in [place]? --     Constitutional: Alert and oriented. Well appearing and in no acute distress. Eyes: Conjunctivae are normal. PERRL. EOMI. Head: Atraumatic. Mouth/Throat: Mucous membranes are moist.  Oropharynx non-erythematous. Neck: No stridor.  Cardiovascular: Normal rate, regular rhythm. Good peripheral circulation. Grossly normal heart sounds. Respiratory: Normal respiratory effort.  No retractions. Lungs CTAB. Gastrointestinal: Soft and nontender. No distention.  Musculoskeletal: No lower extremity tenderness nor edema. No gross deformities of extremities. Neurologic:  Normal speech and language. No gross focal neurologic deficits are appreciated.  Skin:  12 cm V-shaped laceration with skin flap left axilla bleeding controlled multiple abrasions proximal and distal to laceration Psychiatric: Mood and affect are normal. Speech and behavior are normal.    PROCEDURES   .Marland KitchenLaceration Repair Date/Time: 12/12/2016 5:41 AM Performed by: Gregor Hams Authorized by: Gregor Hams   Consent:    Consent obtained:  Verbal   Consent given by:  Patient   Risks discussed:  Infection, pain, poor cosmetic result, need for additional repair and poor wound healing   Alternatives discussed:  No treatment Anesthesia (see MAR for exact dosages):    Anesthesia method:  Local infiltration   Local anesthetic:  Lidocaine 1% w/o epi Laceration details:    Location:  Trunk   Trunk location:  L axilla   Length (cm):  12   Depth (mm):  15 Repair type:    Repair type:  Complex Pre-procedure details:    Preparation:  Patient was prepped and draped in usual sterile fashion Exploration:    Wound exploration: wound explored through full range of motion     Contaminated: no   Treatment:    Area cleansed with:  Betadine and saline   Amount of cleaning:  Extensive   Irrigation solution:  Sterile  saline   Irrigation volume:  20   Irrigation method:  Syringe Subcutaneous repair:    Suture size:  4-0   Suture material:  Plain gut   Suture technique:  Simple interrupted   Number of sutures:  5 Skin repair:    Repair method:  Sutures   Suture size:  4-0   Suture material:  Nylon   Suture technique:  Simple interrupted   Number of sutures:  11 Approximation:    Approximation:  Close Post-procedure details:    Dressing:  Antibiotic ointment   Patient tolerance of procedure:  Tolerated well, no immediate complications     ____________________________________________   INITIAL IMPRESSION / ASSESSMENT AND PLAN / ED COURSE  Pertinent labs & imaging results that were available during my care of the patient were reviewed by me and considered in my medical decision making (  see chart for details).        ____________________________________________  FINAL CLINICAL IMPRESSION(S) / ED DIAGNOSES  Final diagnoses:  Laceration of left axilla, initial encounter     MEDICATIONS GIVEN DURING THIS VISIT:  Medications  cephALEXin (KEFLEX) capsule 500 mg (500 mg Oral Given 12/10/16 0426)  lidocaine (PF) (XYLOCAINE) 1 % injection 10 mL (10 mLs Intradermal Given 12/10/16 0427)  Tdap (BOOSTRIX) injection 0.5 mL (0.5 mLs Intramuscular Given 12/10/16 0436)     NEW OUTPATIENT MEDICATIONS STARTED DURING THIS VISIT:  Discharge Medication List as of 12/10/2016  4:34 AM    START taking these medications   Details  cephALEXin (KEFLEX) 500 MG capsule Take 1 capsule (500 mg total) by mouth 2 (two) times daily., Starting Wed 12/10/2016, Until Sat 12/20/2016, Print        Discharge Medication List as of 12/10/2016  4:34 AM      Discharge Medication List as of 12/10/2016  4:34 AM       Note:  This document was prepared using Dragon voice recognition software and may include unintentional dictation errors.    Gregor Hams, MD 12/12/16 [phone]

## 2018-07-01 ENCOUNTER — Encounter

## 2018-07-01 ENCOUNTER — Ambulatory Visit: Payer: Self-pay | Admitting: Podiatry

## 2019-03-23 ENCOUNTER — Other Ambulatory Visit: Payer: Self-pay

## 2020-03-02 DIAGNOSIS — M13852 Other specified arthritis, left hip: Secondary | ICD-10-CM | POA: Diagnosis not present

## 2020-03-02 DIAGNOSIS — M4716 Other spondylosis with myelopathy, lumbar region: Secondary | ICD-10-CM | POA: Diagnosis not present

## 2020-05-05 ENCOUNTER — Other Ambulatory Visit: Payer: Self-pay

## 2020-05-05 DIAGNOSIS — Y9289 Other specified places as the place of occurrence of the external cause: Secondary | ICD-10-CM | POA: Insufficient documentation

## 2020-05-05 DIAGNOSIS — Z5321 Procedure and treatment not carried out due to patient leaving prior to being seen by health care provider: Secondary | ICD-10-CM | POA: Diagnosis not present

## 2020-05-05 DIAGNOSIS — M549 Dorsalgia, unspecified: Secondary | ICD-10-CM | POA: Diagnosis present

## 2020-05-05 DIAGNOSIS — Y9389 Activity, other specified: Secondary | ICD-10-CM | POA: Insufficient documentation

## 2020-05-05 DIAGNOSIS — Y999 Unspecified external cause status: Secondary | ICD-10-CM | POA: Diagnosis not present

## 2020-05-05 DIAGNOSIS — G319 Degenerative disease of nervous system, unspecified: Secondary | ICD-10-CM | POA: Diagnosis not present

## 2020-05-05 DIAGNOSIS — R200 Anesthesia of skin: Secondary | ICD-10-CM | POA: Diagnosis not present

## 2020-05-05 DIAGNOSIS — W19XXXA Unspecified fall, initial encounter: Secondary | ICD-10-CM | POA: Insufficient documentation

## 2020-05-05 DIAGNOSIS — I6529 Occlusion and stenosis of unspecified carotid artery: Secondary | ICD-10-CM | POA: Diagnosis not present

## 2020-05-05 DIAGNOSIS — M545 Low back pain: Secondary | ICD-10-CM | POA: Diagnosis not present

## 2020-05-05 DIAGNOSIS — M47812 Spondylosis without myelopathy or radiculopathy, cervical region: Secondary | ICD-10-CM | POA: Diagnosis not present

## 2020-05-05 DIAGNOSIS — R296 Repeated falls: Secondary | ICD-10-CM | POA: Diagnosis not present

## 2020-05-05 LAB — CBC WITH DIFFERENTIAL/PLATELET
Abs Immature Granulocytes: 0.15 10*3/uL — ABNORMAL HIGH (ref 0.00–0.07)
Basophils Absolute: 0 10*3/uL (ref 0.0–0.1)
Basophils Relative: 0 %
Eosinophils Absolute: 0 10*3/uL (ref 0.0–0.5)
Eosinophils Relative: 1 %
HCT: 36 % — ABNORMAL LOW (ref 39.0–52.0)
Hemoglobin: 13.1 g/dL (ref 13.0–17.0)
Immature Granulocytes: 2 %
Lymphocytes Relative: 16 %
Lymphs Abs: 1.3 10*3/uL (ref 0.7–4.0)
MCH: 39.2 pg — ABNORMAL HIGH (ref 26.0–34.0)
MCHC: 36.4 g/dL — ABNORMAL HIGH (ref 30.0–36.0)
MCV: 107.8 fL — ABNORMAL HIGH (ref 80.0–100.0)
Monocytes Absolute: 0.4 10*3/uL (ref 0.1–1.0)
Monocytes Relative: 6 %
Neutro Abs: 6 10*3/uL (ref 1.7–7.7)
Neutrophils Relative %: 75 %
Platelets: 138 10*3/uL — ABNORMAL LOW (ref 150–400)
RBC: 3.34 MIL/uL — ABNORMAL LOW (ref 4.22–5.81)
RDW: 14.6 % (ref 11.5–15.5)
WBC: 8 10*3/uL (ref 4.0–10.5)
nRBC: 0 % (ref 0.0–0.2)

## 2020-05-05 NOTE — ED Triage Notes (Signed)
Patient reports pain in lower back and down legs, patient reports difficulty feeling legs all day and has had several falls.

## 2020-05-06 ENCOUNTER — Emergency Department: Payer: PPO

## 2020-05-06 ENCOUNTER — Emergency Department
Admission: EM | Admit: 2020-05-06 | Discharge: 2020-05-06 | Disposition: A | Discharge status: Home / Self Care | Payer: PPO | Attending: Emergency Medicine | Admitting: Emergency Medicine

## 2020-05-06 DIAGNOSIS — I6529 Occlusion and stenosis of unspecified carotid artery: Secondary | ICD-10-CM | POA: Diagnosis not present

## 2020-05-06 DIAGNOSIS — G319 Degenerative disease of nervous system, unspecified: Secondary | ICD-10-CM | POA: Diagnosis not present

## 2020-05-06 DIAGNOSIS — R296 Repeated falls: Secondary | ICD-10-CM | POA: Diagnosis not present

## 2020-05-06 DIAGNOSIS — R200 Anesthesia of skin: Secondary | ICD-10-CM | POA: Diagnosis not present

## 2020-05-06 DIAGNOSIS — M47812 Spondylosis without myelopathy or radiculopathy, cervical region: Secondary | ICD-10-CM | POA: Diagnosis not present

## 2020-05-06 LAB — COMPREHENSIVE METABOLIC PANEL
ALT: 31 U/L (ref 0–44)
AST: 31 U/L (ref 15–41)
Albumin: 3.2 g/dL — ABNORMAL LOW (ref 3.5–5.0)
Alkaline Phosphatase: 110 U/L (ref 38–126)
Anion gap: 7 (ref 5–15)
BUN: 7 mg/dL — ABNORMAL LOW (ref 8–23)
CO2: 24 mmol/L (ref 22–32)
Calcium: 8.5 mg/dL — ABNORMAL LOW (ref 8.9–10.3)
Chloride: 95 mmol/L — ABNORMAL LOW (ref 98–111)
Creatinine, Ser: 0.58 mg/dL — ABNORMAL LOW (ref 0.61–1.24)
GFR calc Af Amer: >60 mL/min (ref >60)
GFR calc non Af Amer: >60 mL/min (ref >60)
Glucose, Bld: 114 mg/dL — ABNORMAL HIGH (ref 70–99)
Potassium: 3.6 mmol/L (ref 3.5–5.1)
Sodium: 126 mmol/L — ABNORMAL LOW (ref 135–145)
Total Bilirubin: 2 mg/dL — ABNORMAL HIGH (ref 0.3–1.2)
Total Protein: 6.8 g/dL (ref 6.5–8.1)

## 2020-05-06 LAB — TROPONIN I (HIGH SENSITIVITY): Troponin I (High Sensitivity): 16 ng/L (ref <18)

## 2020-07-30 DIAGNOSIS — I4891 Unspecified atrial fibrillation: Secondary | ICD-10-CM | POA: Diagnosis not present

## 2020-07-30 DIAGNOSIS — I951 Orthostatic hypotension: Secondary | ICD-10-CM | POA: Diagnosis not present

## 2020-07-30 DIAGNOSIS — Z951 Presence of aortocoronary bypass graft: Secondary | ICD-10-CM | POA: Diagnosis not present

## 2020-07-30 DIAGNOSIS — M353 Polymyalgia rheumatica: Secondary | ICD-10-CM | POA: Diagnosis not present

## 2020-07-30 DIAGNOSIS — M109 Gout, unspecified: Secondary | ICD-10-CM | POA: Diagnosis not present

## 2020-07-30 DIAGNOSIS — J449 Chronic obstructive pulmonary disease, unspecified: Secondary | ICD-10-CM | POA: Diagnosis not present

## 2020-07-30 DIAGNOSIS — I10 Essential (primary) hypertension: Secondary | ICD-10-CM | POA: Diagnosis not present

## 2020-07-30 DIAGNOSIS — E785 Hyperlipidemia, unspecified: Secondary | ICD-10-CM | POA: Diagnosis not present

## 2020-07-30 DIAGNOSIS — I482 Chronic atrial fibrillation, unspecified: Secondary | ICD-10-CM | POA: Diagnosis not present

## 2020-07-30 DIAGNOSIS — K219 Gastro-esophageal reflux disease without esophagitis: Secondary | ICD-10-CM | POA: Diagnosis not present

## 2020-07-30 DIAGNOSIS — M87052 Idiopathic aseptic necrosis of left femur: Secondary | ICD-10-CM | POA: Diagnosis not present

## 2020-07-30 DIAGNOSIS — Z9181 History of falling: Secondary | ICD-10-CM | POA: Diagnosis not present

## 2020-07-30 DIAGNOSIS — M87051 Idiopathic aseptic necrosis of right femur: Secondary | ICD-10-CM | POA: Diagnosis not present

## 2020-07-30 DIAGNOSIS — F1010 Alcohol abuse, uncomplicated: Secondary | ICD-10-CM | POA: Diagnosis not present

## 2020-07-30 DIAGNOSIS — M84652D Pathological fracture in other disease, left femur, subsequent encounter for fracture with routine healing: Secondary | ICD-10-CM | POA: Diagnosis not present

## 2020-07-30 DIAGNOSIS — M4800 Spinal stenosis, site unspecified: Secondary | ICD-10-CM | POA: Diagnosis not present

## 2020-07-30 DIAGNOSIS — M160 Bilateral primary osteoarthritis of hip: Secondary | ICD-10-CM | POA: Diagnosis not present

## 2020-07-30 DIAGNOSIS — Z96642 Presence of left artificial hip joint: Secondary | ICD-10-CM | POA: Diagnosis not present

## 2020-07-30 DIAGNOSIS — I2510 Atherosclerotic heart disease of native coronary artery without angina pectoris: Secondary | ICD-10-CM | POA: Diagnosis not present

## 2020-07-30 DIAGNOSIS — F1020 Alcohol dependence, uncomplicated: Secondary | ICD-10-CM | POA: Diagnosis not present

## 2020-07-30 DIAGNOSIS — K2270 Barrett's esophagus without dysplasia: Secondary | ICD-10-CM | POA: Diagnosis not present

## 2020-07-31 DIAGNOSIS — I2510 Atherosclerotic heart disease of native coronary artery without angina pectoris: Secondary | ICD-10-CM | POA: Diagnosis not present

## 2020-07-31 DIAGNOSIS — I482 Chronic atrial fibrillation, unspecified: Secondary | ICD-10-CM | POA: Diagnosis not present

## 2020-07-31 DIAGNOSIS — J449 Chronic obstructive pulmonary disease, unspecified: Secondary | ICD-10-CM | POA: Insufficient documentation

## 2020-07-31 DIAGNOSIS — M160 Bilateral primary osteoarthritis of hip: Secondary | ICD-10-CM | POA: Diagnosis not present

## 2020-07-31 DIAGNOSIS — F1020 Alcohol dependence, uncomplicated: Secondary | ICD-10-CM | POA: Diagnosis not present

## 2020-08-04 DIAGNOSIS — I10 Essential (primary) hypertension: Secondary | ICD-10-CM | POA: Diagnosis not present

## 2020-08-16 DIAGNOSIS — K2270 Barrett's esophagus without dysplasia: Secondary | ICD-10-CM | POA: Diagnosis not present

## 2020-08-16 DIAGNOSIS — Z951 Presence of aortocoronary bypass graft: Secondary | ICD-10-CM | POA: Diagnosis not present

## 2020-08-16 DIAGNOSIS — M1611 Unilateral primary osteoarthritis, right hip: Secondary | ICD-10-CM | POA: Diagnosis not present

## 2020-08-16 DIAGNOSIS — M87052 Idiopathic aseptic necrosis of left femur: Secondary | ICD-10-CM | POA: Diagnosis not present

## 2020-08-16 DIAGNOSIS — T84011D Broken internal left hip prosthesis, subsequent encounter: Secondary | ICD-10-CM | POA: Diagnosis not present

## 2020-08-16 DIAGNOSIS — I10 Essential (primary) hypertension: Secondary | ICD-10-CM | POA: Diagnosis not present

## 2020-08-16 DIAGNOSIS — M87051 Idiopathic aseptic necrosis of right femur: Secondary | ICD-10-CM | POA: Diagnosis not present

## 2020-08-16 DIAGNOSIS — E785 Hyperlipidemia, unspecified: Secondary | ICD-10-CM | POA: Diagnosis not present

## 2020-08-16 DIAGNOSIS — F1020 Alcohol dependence, uncomplicated: Secondary | ICD-10-CM | POA: Diagnosis not present

## 2020-08-16 DIAGNOSIS — Z7951 Long term (current) use of inhaled steroids: Secondary | ICD-10-CM | POA: Diagnosis not present

## 2020-08-16 DIAGNOSIS — I951 Orthostatic hypotension: Secondary | ICD-10-CM | POA: Diagnosis not present

## 2020-08-16 DIAGNOSIS — M4800 Spinal stenosis, site unspecified: Secondary | ICD-10-CM | POA: Diagnosis not present

## 2020-08-16 DIAGNOSIS — J449 Chronic obstructive pulmonary disease, unspecified: Secondary | ICD-10-CM | POA: Diagnosis not present

## 2020-08-16 DIAGNOSIS — M109 Gout, unspecified: Secondary | ICD-10-CM | POA: Diagnosis not present

## 2020-08-16 DIAGNOSIS — I2510 Atherosclerotic heart disease of native coronary artery without angina pectoris: Secondary | ICD-10-CM | POA: Diagnosis not present

## 2020-08-16 DIAGNOSIS — Z87891 Personal history of nicotine dependence: Secondary | ICD-10-CM | POA: Diagnosis not present

## 2020-08-16 DIAGNOSIS — Z9181 History of falling: Secondary | ICD-10-CM | POA: Diagnosis not present

## 2020-08-16 DIAGNOSIS — I482 Chronic atrial fibrillation, unspecified: Secondary | ICD-10-CM | POA: Diagnosis not present

## 2020-08-16 DIAGNOSIS — K219 Gastro-esophageal reflux disease without esophagitis: Secondary | ICD-10-CM | POA: Diagnosis not present

## 2020-08-16 DIAGNOSIS — Z7901 Long term (current) use of anticoagulants: Secondary | ICD-10-CM | POA: Diagnosis not present

## 2020-08-16 DIAGNOSIS — M353 Polymyalgia rheumatica: Secondary | ICD-10-CM | POA: Diagnosis not present

## 2020-08-29 DIAGNOSIS — F1020 Alcohol dependence, uncomplicated: Secondary | ICD-10-CM | POA: Diagnosis not present

## 2020-08-29 DIAGNOSIS — M4800 Spinal stenosis, site unspecified: Secondary | ICD-10-CM | POA: Diagnosis not present

## 2020-08-29 DIAGNOSIS — M87052 Idiopathic aseptic necrosis of left femur: Secondary | ICD-10-CM | POA: Diagnosis not present

## 2020-08-29 DIAGNOSIS — Z7951 Long term (current) use of inhaled steroids: Secondary | ICD-10-CM | POA: Diagnosis not present

## 2020-08-29 DIAGNOSIS — T84011D Broken internal left hip prosthesis, subsequent encounter: Secondary | ICD-10-CM | POA: Diagnosis not present

## 2020-08-29 DIAGNOSIS — Z87891 Personal history of nicotine dependence: Secondary | ICD-10-CM | POA: Diagnosis not present

## 2020-08-29 DIAGNOSIS — M353 Polymyalgia rheumatica: Secondary | ICD-10-CM | POA: Diagnosis not present

## 2020-08-29 DIAGNOSIS — J449 Chronic obstructive pulmonary disease, unspecified: Secondary | ICD-10-CM | POA: Diagnosis not present

## 2020-08-29 DIAGNOSIS — Z9181 History of falling: Secondary | ICD-10-CM | POA: Diagnosis not present

## 2020-08-29 DIAGNOSIS — M109 Gout, unspecified: Secondary | ICD-10-CM | POA: Diagnosis not present

## 2020-08-29 DIAGNOSIS — I10 Essential (primary) hypertension: Secondary | ICD-10-CM | POA: Diagnosis not present

## 2020-08-29 DIAGNOSIS — Z951 Presence of aortocoronary bypass graft: Secondary | ICD-10-CM | POA: Diagnosis not present

## 2020-08-29 DIAGNOSIS — Z7901 Long term (current) use of anticoagulants: Secondary | ICD-10-CM | POA: Diagnosis not present

## 2020-08-29 DIAGNOSIS — I482 Chronic atrial fibrillation, unspecified: Secondary | ICD-10-CM | POA: Diagnosis not present

## 2020-08-29 DIAGNOSIS — I951 Orthostatic hypotension: Secondary | ICD-10-CM | POA: Diagnosis not present

## 2020-08-29 DIAGNOSIS — M1611 Unilateral primary osteoarthritis, right hip: Secondary | ICD-10-CM | POA: Diagnosis not present

## 2020-08-29 DIAGNOSIS — K219 Gastro-esophageal reflux disease without esophagitis: Secondary | ICD-10-CM | POA: Diagnosis not present

## 2020-08-29 DIAGNOSIS — E785 Hyperlipidemia, unspecified: Secondary | ICD-10-CM | POA: Diagnosis not present

## 2020-08-29 DIAGNOSIS — K2270 Barrett's esophagus without dysplasia: Secondary | ICD-10-CM | POA: Diagnosis not present

## 2020-08-29 DIAGNOSIS — I2510 Atherosclerotic heart disease of native coronary artery without angina pectoris: Secondary | ICD-10-CM | POA: Diagnosis not present

## 2020-08-29 DIAGNOSIS — M87051 Idiopathic aseptic necrosis of right femur: Secondary | ICD-10-CM | POA: Diagnosis not present

## 2020-08-30 DIAGNOSIS — F1020 Alcohol dependence, uncomplicated: Secondary | ICD-10-CM | POA: Diagnosis not present

## 2020-08-30 DIAGNOSIS — I482 Chronic atrial fibrillation, unspecified: Secondary | ICD-10-CM | POA: Diagnosis not present

## 2020-08-30 DIAGNOSIS — I951 Orthostatic hypotension: Secondary | ICD-10-CM | POA: Diagnosis not present

## 2020-08-30 DIAGNOSIS — Z7951 Long term (current) use of inhaled steroids: Secondary | ICD-10-CM | POA: Diagnosis not present

## 2020-08-30 DIAGNOSIS — I10 Essential (primary) hypertension: Secondary | ICD-10-CM | POA: Diagnosis not present

## 2020-08-30 DIAGNOSIS — Z951 Presence of aortocoronary bypass graft: Secondary | ICD-10-CM | POA: Diagnosis not present

## 2020-08-30 DIAGNOSIS — M353 Polymyalgia rheumatica: Secondary | ICD-10-CM | POA: Diagnosis not present

## 2020-08-30 DIAGNOSIS — I2510 Atherosclerotic heart disease of native coronary artery without angina pectoris: Secondary | ICD-10-CM | POA: Diagnosis not present

## 2020-08-30 DIAGNOSIS — M109 Gout, unspecified: Secondary | ICD-10-CM | POA: Diagnosis not present

## 2020-08-30 DIAGNOSIS — E785 Hyperlipidemia, unspecified: Secondary | ICD-10-CM | POA: Diagnosis not present

## 2020-08-30 DIAGNOSIS — K2270 Barrett's esophagus without dysplasia: Secondary | ICD-10-CM | POA: Diagnosis not present

## 2020-08-30 DIAGNOSIS — Z87891 Personal history of nicotine dependence: Secondary | ICD-10-CM | POA: Diagnosis not present

## 2020-08-30 DIAGNOSIS — Z7901 Long term (current) use of anticoagulants: Secondary | ICD-10-CM | POA: Diagnosis not present

## 2020-08-30 DIAGNOSIS — M87052 Idiopathic aseptic necrosis of left femur: Secondary | ICD-10-CM | POA: Diagnosis not present

## 2020-08-30 DIAGNOSIS — K219 Gastro-esophageal reflux disease without esophagitis: Secondary | ICD-10-CM | POA: Diagnosis not present

## 2020-08-30 DIAGNOSIS — M4800 Spinal stenosis, site unspecified: Secondary | ICD-10-CM | POA: Diagnosis not present

## 2020-08-30 DIAGNOSIS — T84011D Broken internal left hip prosthesis, subsequent encounter: Secondary | ICD-10-CM | POA: Diagnosis not present

## 2020-08-30 DIAGNOSIS — Z9181 History of falling: Secondary | ICD-10-CM | POA: Diagnosis not present

## 2020-08-30 DIAGNOSIS — J449 Chronic obstructive pulmonary disease, unspecified: Secondary | ICD-10-CM | POA: Diagnosis not present

## 2020-08-30 DIAGNOSIS — M87051 Idiopathic aseptic necrosis of right femur: Secondary | ICD-10-CM | POA: Diagnosis not present

## 2020-08-30 DIAGNOSIS — M1611 Unilateral primary osteoarthritis, right hip: Secondary | ICD-10-CM | POA: Diagnosis not present

## 2021-03-03 IMAGING — CT CT CERVICAL SPINE W/O CM
3 of 4 series · 12 of 33 positions shown, 14 images · non-contrast
Comparison: None.

CLINICAL DATA: Several falls today. Numbness in the legs all day.
Difficulty with ambulation.

EXAM:
CT HEAD WITHOUT CONTRAST
CT CERVICAL SPINE WITHOUT CONTRAST
TECHNIQUE: Multidetector CT imaging of the head and cervical spine was
performed following the standard protocol without intravenous
contrast. Multiplanar CT image reconstructions of the cervical spine
were also generated.

[Series 4: sagittal bone · sagittal · 0.25mm/px · 5 of 63 slices shown, 6 images]
[im 21/63  bone]
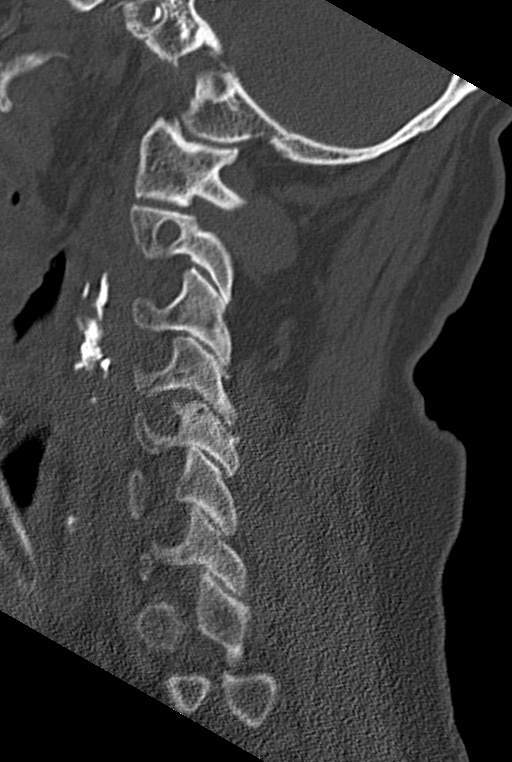
[im 26/63  bone]
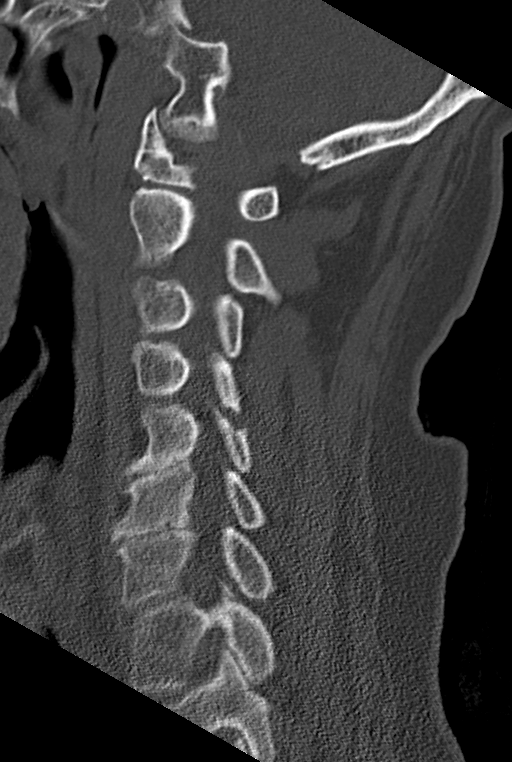
[im 32/63  soft-tissue]
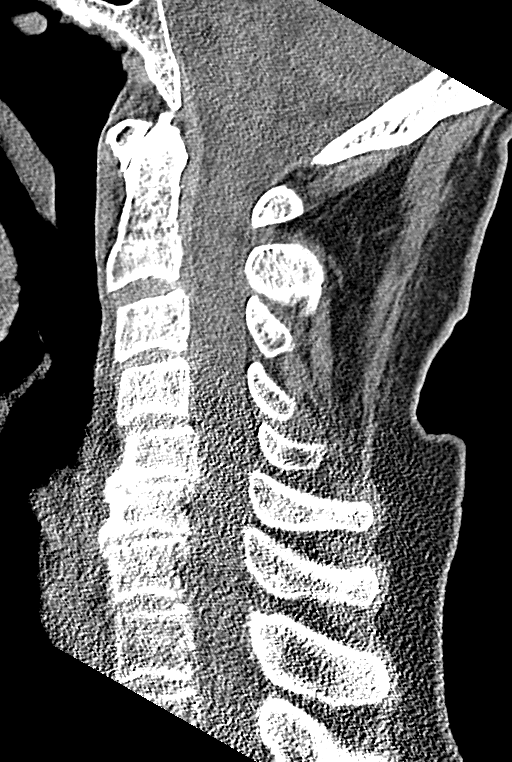
[im 32/63  bone]
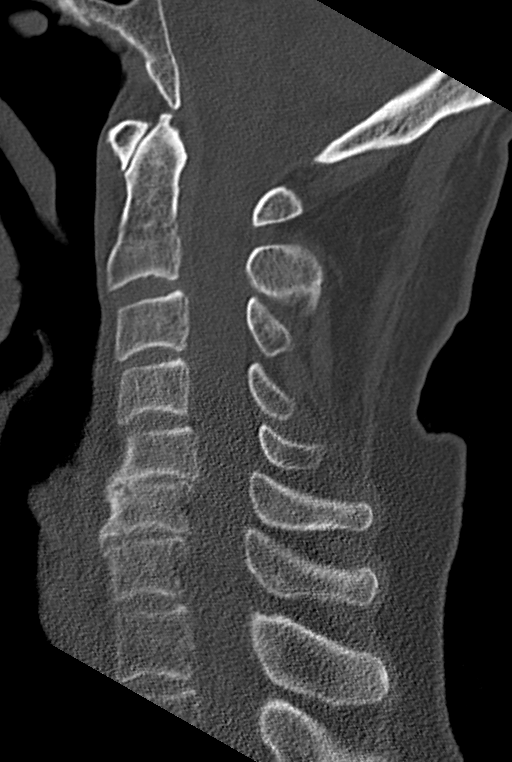
[im 37/63  bone]
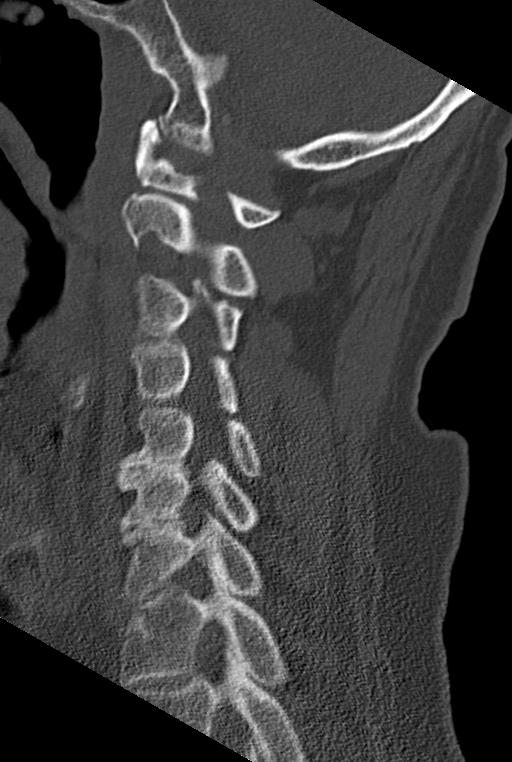
[im 42/63  bone]
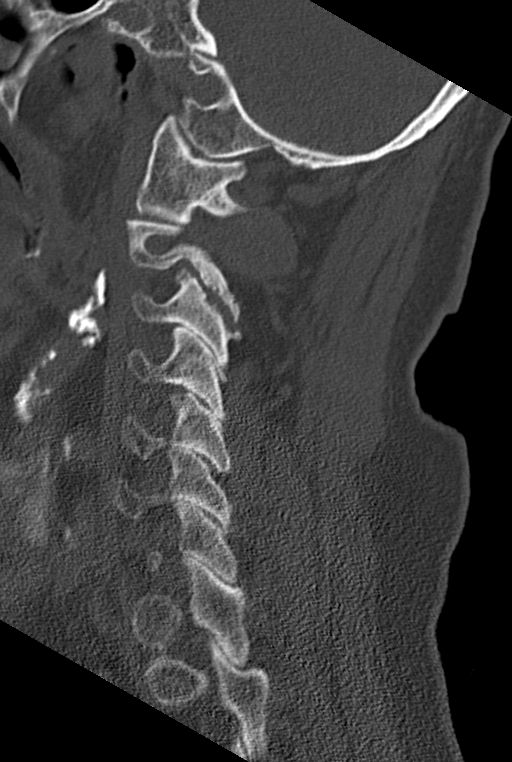

[Series 5: coronal bone · coronal · 0.28mm/px · 3 of 57 slices shown]
[im 14/57  bone]
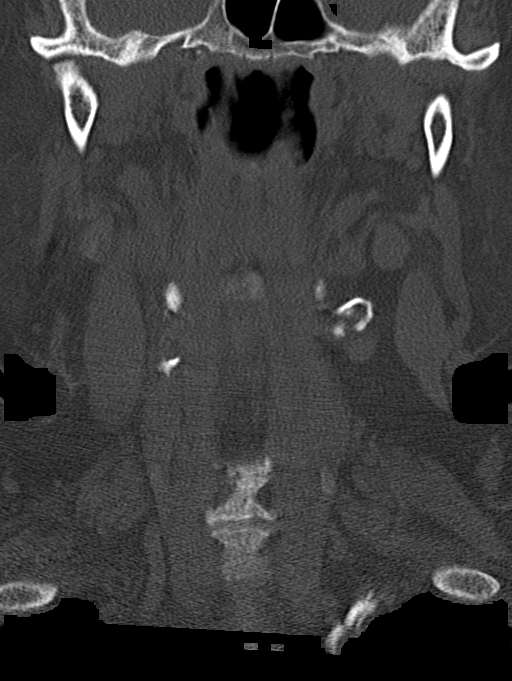
[im 24/57  bone]
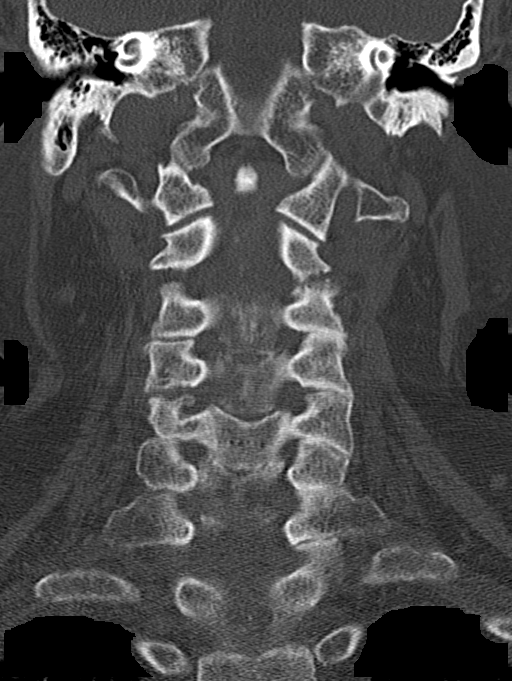
[im 33/57  bone]
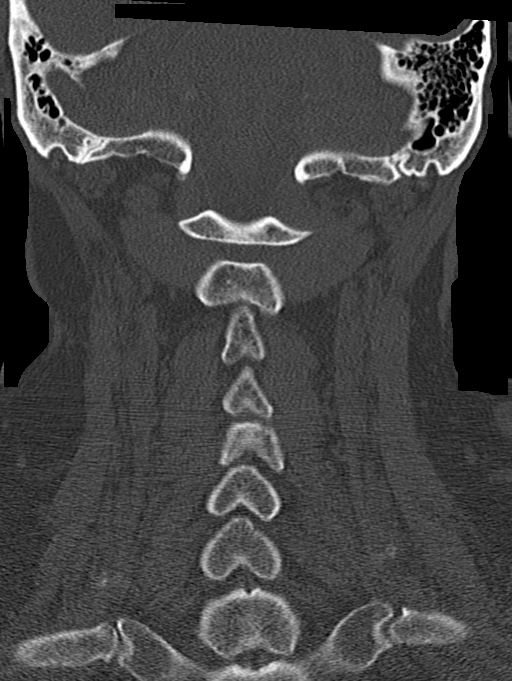

[Series 6: orthogonal bone · axial · 0.24mm/px · z∈[-237,-124]mm · 4 of 99 slices shown, 5 images]
[im 17/99  soft-tissue]
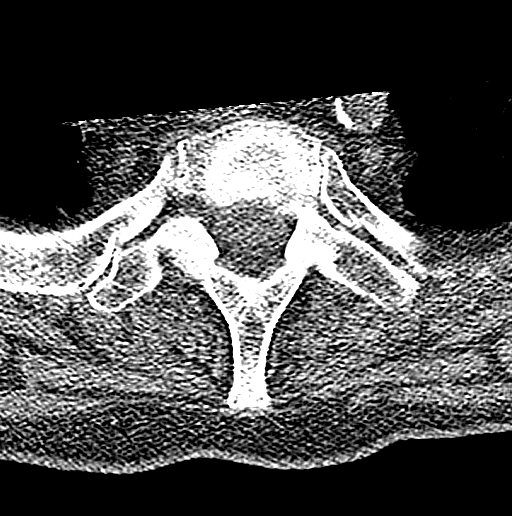
[im 17/99  bone]
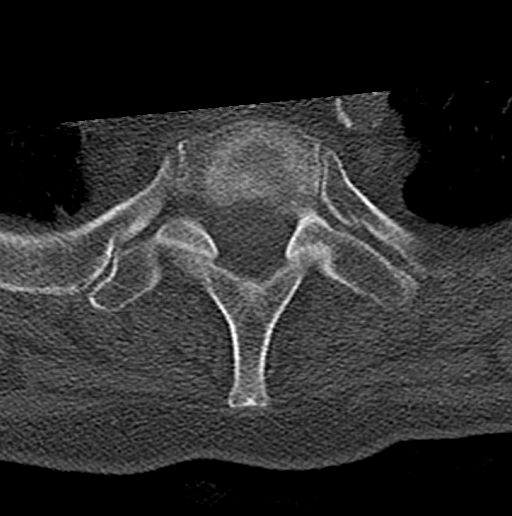
[im 33/99  bone]
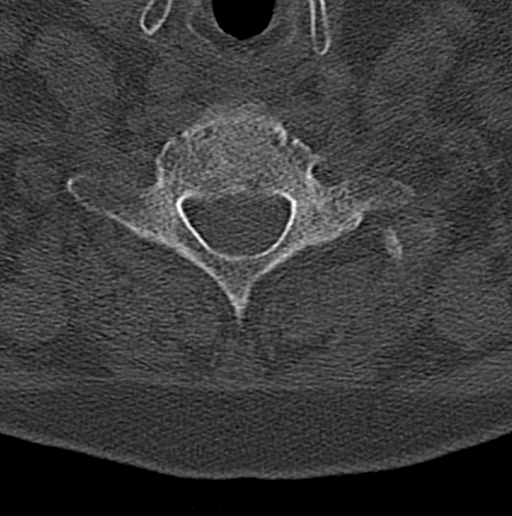
[im 66/99  bone]
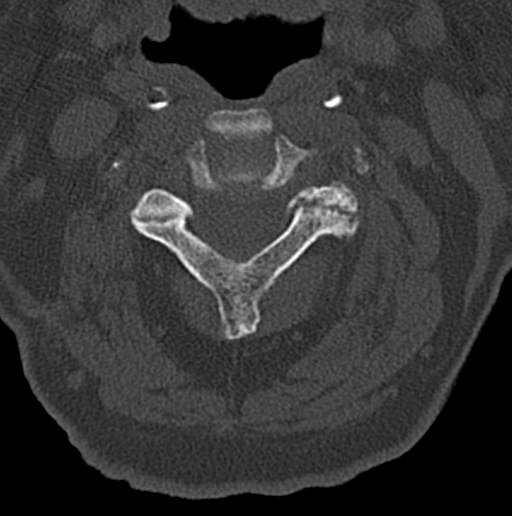
[im 82/99  bone]
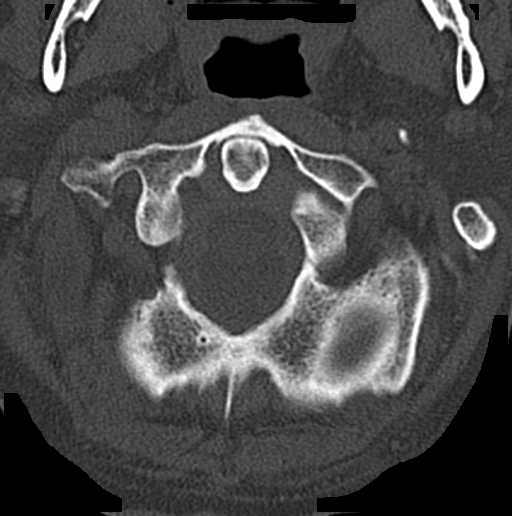

[12 of 33 positions shown; findings below may reference images not displayed]

FINDINGS: CT HEAD FINDINGS

Brain: No evidence of acute infarction, hemorrhage, hydrocephalus,
extra-axial collection or mass lesion/mass effect. Diffuse cerebral
atrophy. Mild ventricular dilatation consistent with central
atrophy. Low-attenuation changes in the deep white matter consistent
with small vessel ischemia.

Vascular: Prominent intracranial arterial vascular calcifications.

Skull: The calvarium appears intact.

Sinuses/Orbits: Paranasal sinuses and mastoid air cells are clear.

Other: None.

CT CERVICAL SPINE FINDINGS

Alignment: Straightening of the usual cervical lordosis. This is
probably due to patient positioning but ligamentous injury or muscle
spasm could also have this appearance and are not excluded.
Correlation with physical examination is recommended. Normal
alignment of the posterior facet joints. C1-2 articulation appears
intact.

Skull base and vertebrae: Skull base appears intact. No vertebral
compression deformities. No focal bone lesion or bone destruction.

Soft tissues and spinal canal: No prevertebral soft tissue swelling.
No abnormal paraspinal soft tissue mass or infiltration.
Calcifications in the tonsils consistent with tonsilloliths.
Prominent carotid vascular calcifications. Calcific stenosis at the
bifurcations is suggested.

Disc levels: Degenerative changes in the cervical spine with
narrowed interspaces and endplate hypertrophic changes, most common
at C5-6 and C6-7 levels.

Upper chest: Visualized lung apices are clear.

Other: None.
IMPRESSION: 1. No acute intracranial abnormalities. Chronic atrophy and small
vessel ischemic changes.
2. Nonspecific straightening of usual cervical lordosis.
Degenerative changes in the cervical spine. No acute displaced
fractures identified.
3. Prominent carotid vascular calcifications. Calcific stenosis at
the bifurcations is likely.

## 2021-03-03 IMAGING — CT CT HEAD W/O CM
3 series · 14 of 46 positions shown, 16 images · non-contrast
Comparison: None.

CLINICAL DATA: Several falls today. Numbness in the legs all day.
Difficulty with ambulation.

EXAM:
CT HEAD WITHOUT CONTRAST
CT CERVICAL SPINE WITHOUT CONTRAST
TECHNIQUE: Multidetector CT imaging of the head and cervical spine was
performed following the standard protocol without intravenous
contrast. Multiplanar CT image reconstructions of the cervical spine
were also generated.

[Series 2: head wo · axial · 0.40mm/px · z∈[-60,+60]mm · 8 of 29 slices shown, 10 images]
[im 3/29  brain]
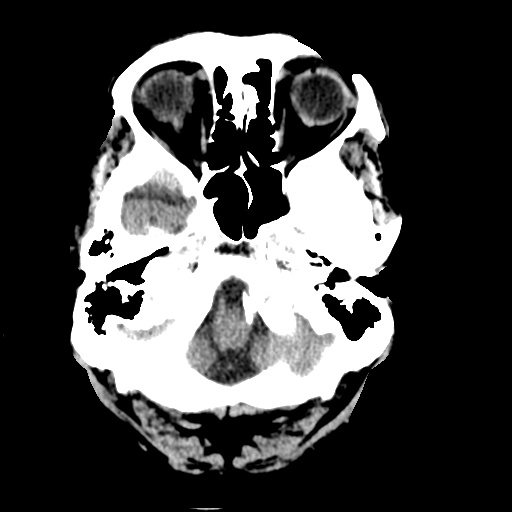
[im 3/29  bone]
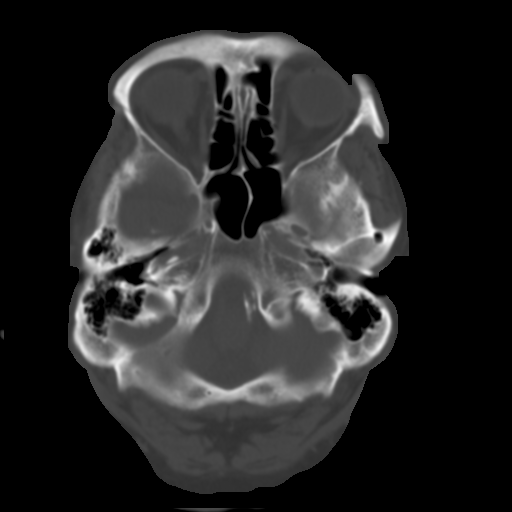
[im 7/29  brain]
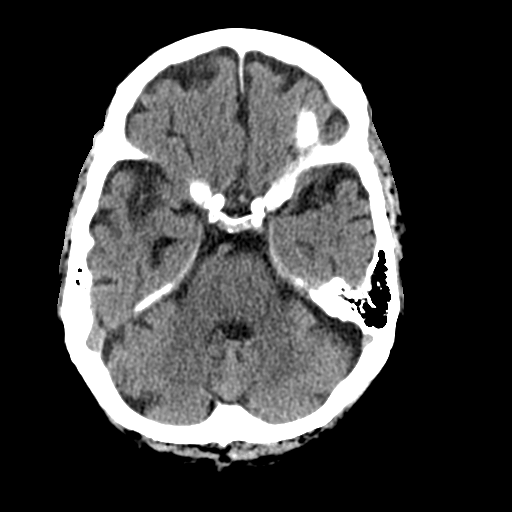
[im 10/29  brain]
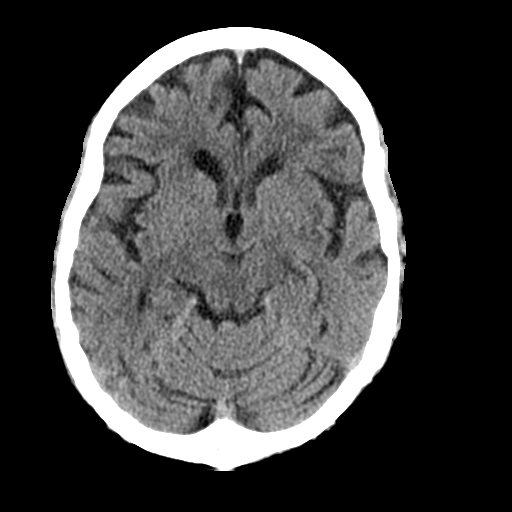
[im 13/29  brain]
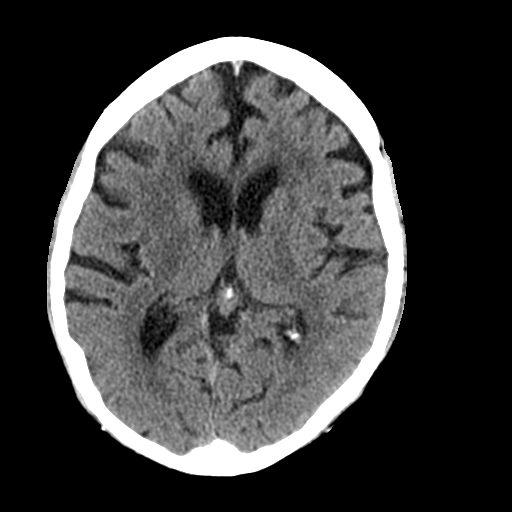
[im 17/29  brain]
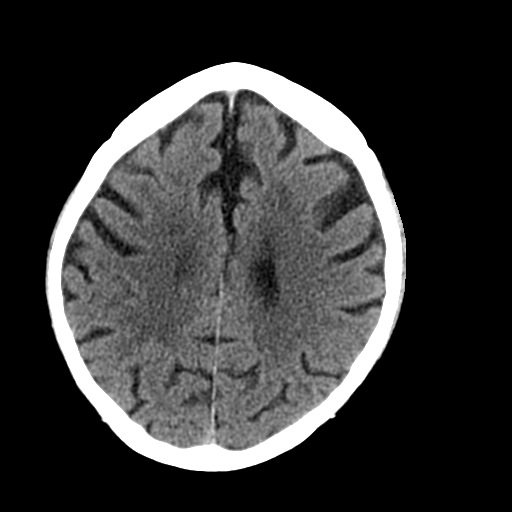
[im 17/29  bone]
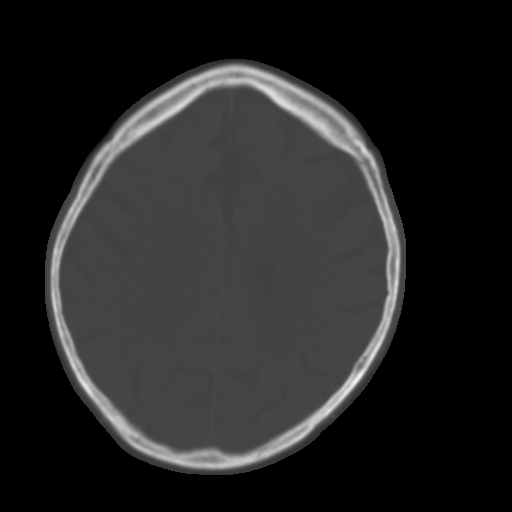
[im 20/29  brain]
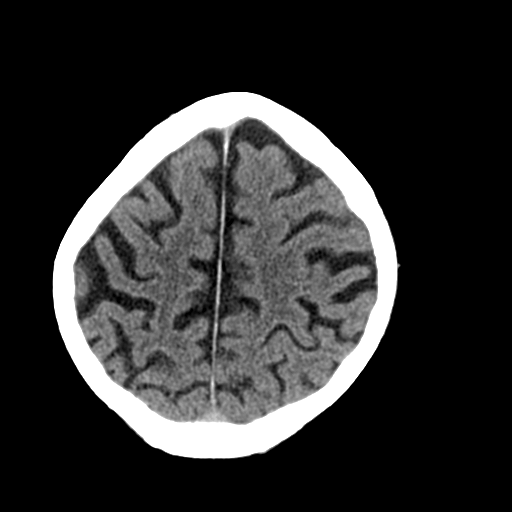
[im 23/29  brain]
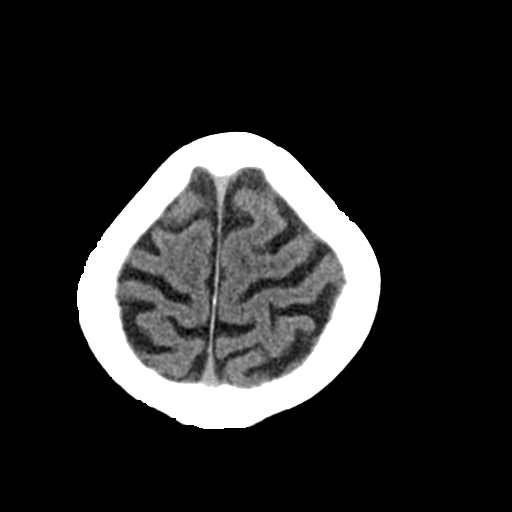
[im 27/29  brain]
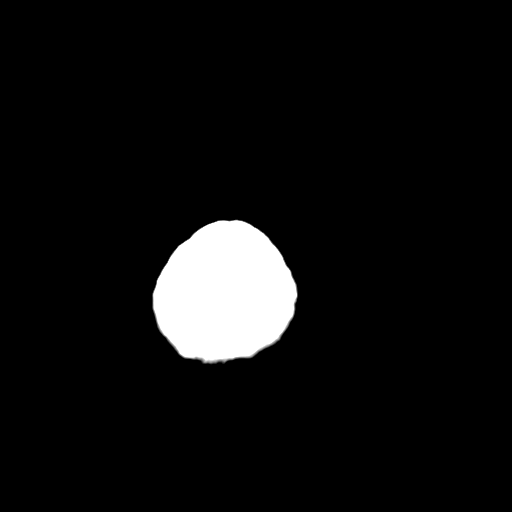

[Series 4: coronal soft tissue · coronal · 0.29mm/px · 3 of 61 slices shown]
[im 21/61  brain]
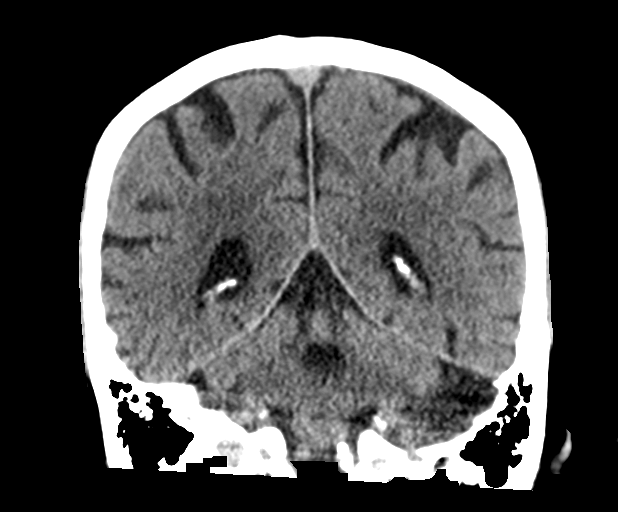
[im 27/61  brain]
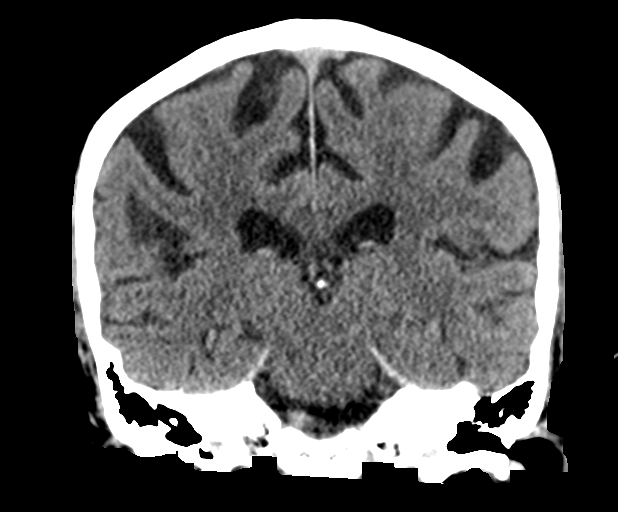
[im 34/61  brain]
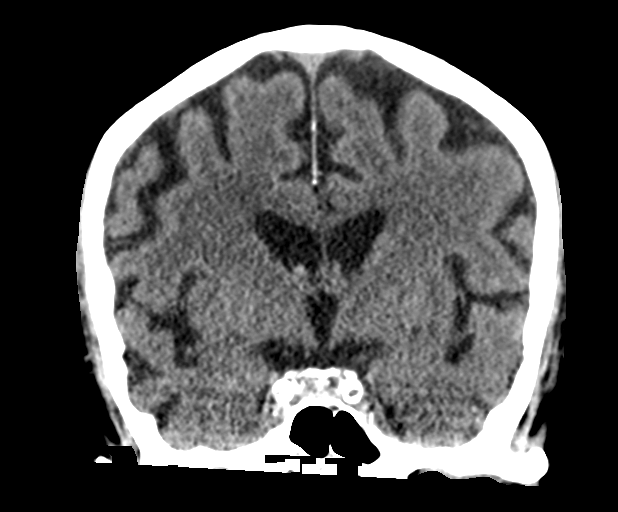

[Series 5: sagittal soft tissue · sagittal · 0.29mm/px · 3 of 51 slices shown]
[im 17/51  brain]
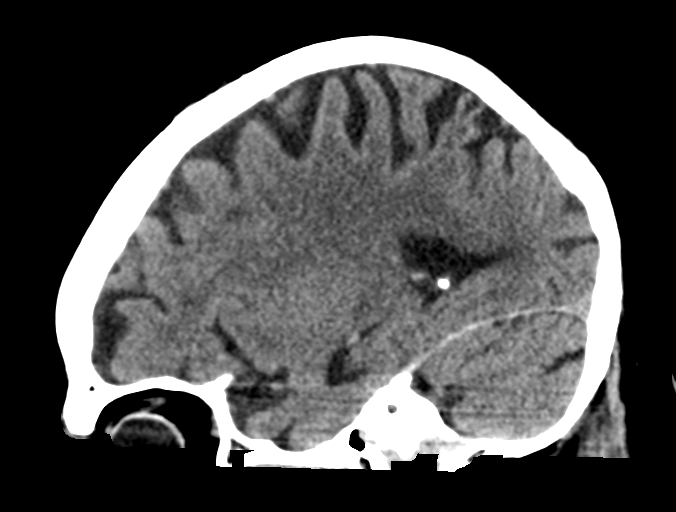
[im 26/51  brain]
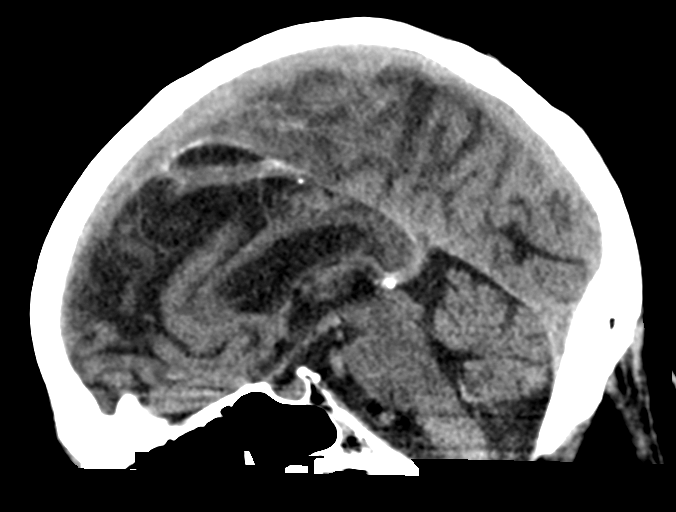
[im 34/51  brain]
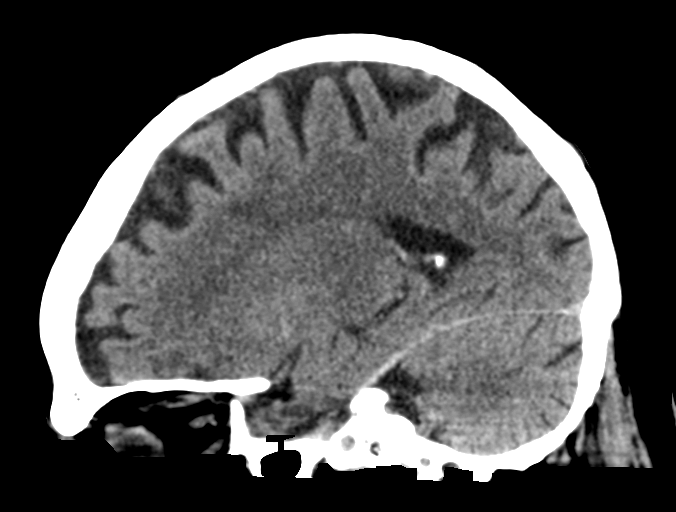

[14 of 46 positions shown; findings below may reference images not displayed]

FINDINGS: CT HEAD FINDINGS

Brain: No evidence of acute infarction, hemorrhage, hydrocephalus,
extra-axial collection or mass lesion/mass effect. Diffuse cerebral
atrophy. Mild ventricular dilatation consistent with central
atrophy. Low-attenuation changes in the deep white matter consistent
with small vessel ischemia.

Vascular: Prominent intracranial arterial vascular calcifications.

Skull: The calvarium appears intact.

Sinuses/Orbits: Paranasal sinuses and mastoid air cells are clear.

Other: None.

CT CERVICAL SPINE FINDINGS

Alignment: Straightening of the usual cervical lordosis. This is
probably due to patient positioning but ligamentous injury or muscle
spasm could also have this appearance and are not excluded.
Correlation with physical examination is recommended. Normal
alignment of the posterior facet joints. C1-2 articulation appears
intact.

Skull base and vertebrae: Skull base appears intact. No vertebral
compression deformities. No focal bone lesion or bone destruction.

Soft tissues and spinal canal: No prevertebral soft tissue swelling.
No abnormal paraspinal soft tissue mass or infiltration.
Calcifications in the tonsils consistent with tonsilloliths.
Prominent carotid vascular calcifications. Calcific stenosis at the
bifurcations is suggested.

Disc levels: Degenerative changes in the cervical spine with
narrowed interspaces and endplate hypertrophic changes, most common
at C5-6 and C6-7 levels.

Upper chest: Visualized lung apices are clear.

Other: None.
IMPRESSION: 1. No acute intracranial abnormalities. Chronic atrophy and small
vessel ischemic changes.
2. Nonspecific straightening of usual cervical lordosis.
Degenerative changes in the cervical spine. No acute displaced
fractures identified.
3. Prominent carotid vascular calcifications. Calcific stenosis at
the bifurcations is likely.

## 2021-07-15 ENCOUNTER — Encounter: Payer: No Typology Code available for payment source | Admitting: Podiatry

## 2021-07-24 ENCOUNTER — Ambulatory Visit (INDEPENDENT_AMBULATORY_CARE_PROVIDER_SITE_OTHER): Payer: No Typology Code available for payment source | Admitting: Podiatry

## 2021-07-24 ENCOUNTER — Other Ambulatory Visit: Payer: Self-pay

## 2021-07-24 ENCOUNTER — Encounter: Payer: Self-pay | Admitting: Podiatry

## 2021-07-24 DIAGNOSIS — M47816 Spondylosis without myelopathy or radiculopathy, lumbar region: Secondary | ICD-10-CM | POA: Insufficient documentation

## 2021-07-24 DIAGNOSIS — R059 Cough, unspecified: Secondary | ICD-10-CM | POA: Insufficient documentation

## 2021-07-24 DIAGNOSIS — Z741 Need for assistance with personal care: Secondary | ICD-10-CM | POA: Insufficient documentation

## 2021-07-24 DIAGNOSIS — I209 Angina pectoris, unspecified: Secondary | ICD-10-CM | POA: Insufficient documentation

## 2021-07-24 DIAGNOSIS — M109 Gout, unspecified: Secondary | ICD-10-CM | POA: Insufficient documentation

## 2021-07-24 DIAGNOSIS — H2510 Age-related nuclear cataract, unspecified eye: Secondary | ICD-10-CM | POA: Insufficient documentation

## 2021-07-24 DIAGNOSIS — R55 Syncope and collapse: Secondary | ICD-10-CM | POA: Insufficient documentation

## 2021-07-24 DIAGNOSIS — L84 Corns and callosities: Secondary | ICD-10-CM | POA: Diagnosis not present

## 2021-07-24 DIAGNOSIS — M84459A Pathological fracture, hip, unspecified, initial encounter for fracture: Secondary | ICD-10-CM | POA: Insufficient documentation

## 2021-07-24 DIAGNOSIS — L300 Nummular dermatitis: Secondary | ICD-10-CM | POA: Insufficient documentation

## 2021-07-24 DIAGNOSIS — R52 Pain, unspecified: Secondary | ICD-10-CM

## 2021-07-24 DIAGNOSIS — M7742 Metatarsalgia, left foot: Secondary | ICD-10-CM

## 2021-07-24 DIAGNOSIS — I6521 Occlusion and stenosis of right carotid artery: Secondary | ICD-10-CM | POA: Insufficient documentation

## 2021-07-24 DIAGNOSIS — N529 Male erectile dysfunction, unspecified: Secondary | ICD-10-CM | POA: Insufficient documentation

## 2021-07-24 DIAGNOSIS — R69 Illness, unspecified: Secondary | ICD-10-CM | POA: Insufficient documentation

## 2021-07-24 DIAGNOSIS — R7989 Other specified abnormal findings of blood chemistry: Secondary | ICD-10-CM | POA: Insufficient documentation

## 2021-07-24 DIAGNOSIS — F54 Psychological and behavioral factors associated with disorders or diseases classified elsewhere: Secondary | ICD-10-CM | POA: Insufficient documentation

## 2021-07-24 DIAGNOSIS — K219 Gastro-esophageal reflux disease without esophagitis: Secondary | ICD-10-CM | POA: Insufficient documentation

## 2021-07-24 DIAGNOSIS — M75100 Unspecified rotator cuff tear or rupture of unspecified shoulder, not specified as traumatic: Secondary | ICD-10-CM | POA: Insufficient documentation

## 2021-07-24 DIAGNOSIS — M25552 Pain in left hip: Secondary | ICD-10-CM | POA: Insufficient documentation

## 2021-07-24 DIAGNOSIS — M75122 Complete rotator cuff tear or rupture of left shoulder, not specified as traumatic: Secondary | ICD-10-CM | POA: Insufficient documentation

## 2021-07-24 DIAGNOSIS — M353 Polymyalgia rheumatica: Secondary | ICD-10-CM | POA: Insufficient documentation

## 2021-07-24 DIAGNOSIS — S72009A Fracture of unspecified part of neck of unspecified femur, initial encounter for closed fracture: Secondary | ICD-10-CM | POA: Insufficient documentation

## 2021-07-24 DIAGNOSIS — E871 Hypo-osmolality and hyponatremia: Secondary | ICD-10-CM | POA: Insufficient documentation

## 2021-07-24 DIAGNOSIS — Z91199 Patient's noncompliance with other medical treatment and regimen due to unspecified reason: Secondary | ICD-10-CM | POA: Insufficient documentation

## 2021-07-24 DIAGNOSIS — H4720 Unspecified optic atrophy: Secondary | ICD-10-CM | POA: Insufficient documentation

## 2021-07-24 DIAGNOSIS — M2170 Unequal limb length (acquired), unspecified site: Secondary | ICD-10-CM | POA: Diagnosis not present

## 2021-07-24 DIAGNOSIS — R0600 Dyspnea, unspecified: Secondary | ICD-10-CM | POA: Insufficient documentation

## 2021-07-24 DIAGNOSIS — Z659 Problem related to unspecified psychosocial circumstances: Secondary | ICD-10-CM | POA: Insufficient documentation

## 2021-07-24 DIAGNOSIS — G8918 Other acute postprocedural pain: Secondary | ICD-10-CM | POA: Insufficient documentation

## 2021-07-24 DIAGNOSIS — M7741 Metatarsalgia, right foot: Secondary | ICD-10-CM

## 2021-07-24 DIAGNOSIS — R262 Difficulty in walking, not elsewhere classified: Secondary | ICD-10-CM | POA: Insufficient documentation

## 2021-07-24 DIAGNOSIS — M19011 Primary osteoarthritis, right shoulder: Secondary | ICD-10-CM | POA: Insufficient documentation

## 2021-07-24 DIAGNOSIS — H26491 Other secondary cataract, right eye: Secondary | ICD-10-CM | POA: Insufficient documentation

## 2021-07-24 DIAGNOSIS — M19012 Primary osteoarthritis, left shoulder: Secondary | ICD-10-CM | POA: Insufficient documentation

## 2021-07-24 DIAGNOSIS — M87859 Other osteonecrosis, unspecified femur: Secondary | ICD-10-CM | POA: Insufficient documentation

## 2021-07-24 DIAGNOSIS — R2689 Other abnormalities of gait and mobility: Secondary | ICD-10-CM | POA: Insufficient documentation

## 2021-07-24 DIAGNOSIS — R011 Cardiac murmur, unspecified: Secondary | ICD-10-CM | POA: Insufficient documentation

## 2021-07-24 DIAGNOSIS — K2270 Barrett's esophagus without dysplasia: Secondary | ICD-10-CM | POA: Insufficient documentation

## 2021-07-24 DIAGNOSIS — B159 Hepatitis A without hepatic coma: Secondary | ICD-10-CM | POA: Insufficient documentation

## 2021-07-24 DIAGNOSIS — F1010 Alcohol abuse, uncomplicated: Secondary | ICD-10-CM | POA: Insufficient documentation

## 2021-07-24 DIAGNOSIS — L309 Dermatitis, unspecified: Secondary | ICD-10-CM | POA: Insufficient documentation

## 2021-07-24 DIAGNOSIS — M545 Low back pain, unspecified: Secondary | ICD-10-CM | POA: Insufficient documentation

## 2021-07-24 DIAGNOSIS — M1612 Unilateral primary osteoarthritis, left hip: Secondary | ICD-10-CM | POA: Insufficient documentation

## 2021-07-24 DIAGNOSIS — D485 Neoplasm of uncertain behavior of skin: Secondary | ICD-10-CM | POA: Insufficient documentation

## 2021-07-24 NOTE — Progress Notes (Signed)
This encounter was created in error - please disregard.

## 2021-07-24 NOTE — Progress Notes (Signed)
  Subjective:  Patient ID: Kenneth Stevenson, male    DOB: Aug 18, 1946,  MRN: [id number]  Chief Complaint  Patient presents with   Foot Pain    75 y.o. male presents with the above complaint. History confirmed with patient.  He was for to [place] by the [facility] for evaluation of plantar foot pain bilateral with calluses on both feet as well.  He has an appointment with [facility] in 2 weeks to be fitted for custom molded orthoses.  He had an surgery and ambulation and thinks his left leg is now longer than his right leg.  Objective:  Physical Exam: warm, good capillary refill, no trophic changes or ulcerative lesions, normal DP and PT pulses, normal sensory exam, and significant varicose veins noted.  He has a 1 cm longer left leg than right leg measured at the medial malleolus and plantar heel.  Thinning of the metatarsal fat pad and hyperkeratotic lesions submetatarsal 2 and 5 bilateral painful to touch. Assessment:   1. Metatarsalgia of both feet   2. Acquired unequal limb length      Plan:  Patient was evaluated and treated and all questions answered.  Discussed with him that he has metatarsalgia and thinning of the metatarsal fat pad.  Discussed with him there is not a great way to get this back and typically supporting with offloading pads and custom molded orthoses is the best treatment option.  He already has an appointment made to be fitted for these at [facility].  I gave him an additional prescription to integrate additional metatarsal pads and a 1 cm heel lift on the right side into his orthotics.  I also put felt pad versions of these in his shoes today and he felt quite immediate relief.  He will return to see me as needed for this or other issues.  All symptomatic hyperkeratoses were safely debrided with a sterile #15 blade to patient's level of comfort without incident. We discussed preventative and palliative care of these lesions including supportive and accommodative shoegear,  padding, prefabricated and custom molded accommodative orthoses, use of a pumice stone and lotions/creams daily.  Return if symptoms worsen or fail to improve.

## 2022-05-30 ENCOUNTER — Emergency Department
Admission: EM | Admit: 2022-05-30 | Discharge: 2022-05-30 | Disposition: A | Discharge status: Home / Self Care | Payer: No Typology Code available for payment source | Attending: Emergency Medicine | Admitting: Emergency Medicine

## 2022-05-30 ENCOUNTER — Emergency Department: Payer: No Typology Code available for payment source

## 2022-05-30 ENCOUNTER — Other Ambulatory Visit: Payer: Self-pay

## 2022-05-30 DIAGNOSIS — W01198A Fall on same level from slipping, tripping and stumbling with subsequent striking against other object, initial encounter: Secondary | ICD-10-CM | POA: Insufficient documentation

## 2022-05-30 DIAGNOSIS — Z7901 Long term (current) use of anticoagulants: Secondary | ICD-10-CM | POA: Insufficient documentation

## 2022-05-30 DIAGNOSIS — I4891 Unspecified atrial fibrillation: Secondary | ICD-10-CM | POA: Insufficient documentation

## 2022-05-30 DIAGNOSIS — J449 Chronic obstructive pulmonary disease, unspecified: Secondary | ICD-10-CM | POA: Diagnosis not present

## 2022-05-30 DIAGNOSIS — S065X0A Traumatic subdural hemorrhage without loss of consciousness, initial encounter: Secondary | ICD-10-CM | POA: Insufficient documentation

## 2022-05-30 DIAGNOSIS — S0990XA Unspecified injury of head, initial encounter: Secondary | ICD-10-CM | POA: Diagnosis present

## 2022-05-30 DIAGNOSIS — I509 Heart failure, unspecified: Secondary | ICD-10-CM | POA: Insufficient documentation

## 2022-05-30 DIAGNOSIS — Y92000 Kitchen of unspecified non-institutional (private) residence as  the place of occurrence of the external cause: Secondary | ICD-10-CM | POA: Diagnosis not present

## 2022-05-30 DIAGNOSIS — S01112A Laceration without foreign body of left eyelid and periocular area, initial encounter: Secondary | ICD-10-CM | POA: Insufficient documentation

## 2022-05-30 DIAGNOSIS — S065XAA Traumatic subdural hemorrhage with loss of consciousness status unknown, initial encounter: Secondary | ICD-10-CM

## 2022-05-30 LAB — CBC WITH DIFFERENTIAL/PLATELET
Abs Immature Granulocytes: 0.02 10*3/uL (ref 0.00–0.07)
Basophils Absolute: 0.1 10*3/uL (ref 0.0–0.1)
Basophils Relative: 1 %
Eosinophils Absolute: 0.2 10*3/uL (ref 0.0–0.5)
Eosinophils Relative: 4 %
HCT: 35.2 % — ABNORMAL LOW (ref 39.0–52.0)
Hemoglobin: 11.8 g/dL — ABNORMAL LOW (ref 13.0–17.0)
Immature Granulocytes: 0 %
Lymphocytes Relative: 19 %
Lymphs Abs: 1.2 10*3/uL (ref 0.7–4.0)
MCH: 33.9 pg (ref 26.0–34.0)
MCHC: 33.5 g/dL (ref 30.0–36.0)
MCV: 101.1 fL — ABNORMAL HIGH (ref 80.0–100.0)
Monocytes Absolute: 0.8 10*3/uL (ref 0.1–1.0)
Monocytes Relative: 14 %
Neutro Abs: 3.8 10*3/uL (ref 1.7–7.7)
Neutrophils Relative %: 62 %
Platelets: 153 10*3/uL (ref 150–400)
RBC: 3.48 MIL/uL — ABNORMAL LOW (ref 4.22–5.81)
RDW: 19.4 % — ABNORMAL HIGH (ref 11.5–15.5)
WBC: 6.1 10*3/uL (ref 4.0–10.5)
nRBC: 0 % (ref 0.0–0.2)

## 2022-05-30 LAB — PROTIME-INR
INR: 1.9 — ABNORMAL HIGH (ref 0.8–1.2)
Prothrombin Time: 22 seconds — ABNORMAL HIGH (ref 11.4–15.2)

## 2022-05-30 LAB — COMPREHENSIVE METABOLIC PANEL
ALT: 33 U/L (ref 0–44)
AST: 76 U/L — ABNORMAL HIGH (ref 15–41)
Albumin: 3.1 g/dL — ABNORMAL LOW (ref 3.5–5.0)
Alkaline Phosphatase: 138 U/L — ABNORMAL HIGH (ref 38–126)
Anion gap: 6 (ref 5–15)
BUN: 8 mg/dL (ref 8–23)
CO2: 24 mmol/L (ref 22–32)
Calcium: 9.3 mg/dL (ref 8.9–10.3)
Chloride: 103 mmol/L (ref 98–111)
Creatinine, Ser: 0.86 mg/dL (ref 0.61–1.24)
GFR, Estimated: >60 mL/min (ref >60)
Glucose, Bld: 109 mg/dL — ABNORMAL HIGH (ref 70–99)
Potassium: 4 mmol/L (ref 3.5–5.1)
Sodium: 133 mmol/L — ABNORMAL LOW (ref 135–145)
Total Bilirubin: 2 mg/dL — ABNORMAL HIGH (ref 0.3–1.2)
Total Protein: 8 g/dL (ref 6.5–8.1)

## 2022-05-30 LAB — APTT: aPTT: 64 seconds — ABNORMAL HIGH (ref 24–36)

## 2022-05-30 MED ORDER — LACTATED RINGERS IV BOLUS
500.0000 mL | Freq: Once | INTRAVENOUS | Status: AC
  Administered 2022-05-30: 500 mL via INTRAVENOUS

## 2022-05-30 MED ORDER — LEVETIRACETAM 500 MG PO TABS: 500.0000 mg | ORAL_TABLET | Freq: Two times a day (BID) | ORAL | 0 refills | Status: DC

## 2022-05-30 MED ORDER — ONDANSETRON 4 MG PO TBDP: 4.0000 mg | ORAL_TABLET | Freq: Three times a day (TID) | ORAL | 0 refills | Status: DC | PRN

## 2022-05-30 MED ORDER — LEVETIRACETAM IN NACL 1000 MG/100ML IV SOLN
1000.0000 mg | INTRAVENOUS | Status: AC
  Administered 2022-05-30: 1000 mg via INTRAVENOUS
  Filled 2022-05-30: qty 100

## 2022-05-30 NOTE — ED Notes (Signed)
Pt given another warm blanket as requested.

## 2022-05-30 NOTE — ED Notes (Signed)
Pt's pupils equal round; currently pinpoint.

## 2022-05-30 NOTE — ED Notes (Signed)
Pt reports arteries in neck high percentage of "narrowing/clotting off" but was told he cannot has surgery/tx other than "blood thinner"; pt confirms has been on daily meds including his anticoagulant regularly. Wife remains at bedside. Pt alert, calmly laying on stretcher, skin dry, resp reg/unlabored.

## 2022-05-30 NOTE — ED Notes (Signed)
All blood-work so far collected by other staff members during pt's triage.

## 2022-05-30 NOTE — ED Notes (Signed)
Pt reports feels lightheaded and bc of that off-balance, pt reports has been eating and drinking okay, family at bedside states pt's speech was very hard to understand this morning around 5:30, states pt's speech easier to understand now but still a bit slurred compared to normal, pt denies feeling weak in hands, reports history of weakness in both legs which is why he uses a cane. Pt had fall and hit head around 10pm last night. Pt reports became weak right before fall yesterday.

## 2022-05-30 NOTE — Discharge Instructions (Addendum)
Your CT scan today shows bleeding around the brain due to your fall and head injury yesterday.  Do not take Eliquis for the next 2 weeks.  You may resume taking this medicine on October 20.  Take levetiracetam (Keppra) twice a day as prescribed to protect you from seizures.  Please follow-up with the neurosurgeon, Dr. Cari Caraway for further evaluation.

## 2022-05-30 NOTE — ED Triage Notes (Signed)
PT coming POV from home today for a GLF yesterday while bending over. Pt lost balance and fell hitting their L eye and head on the ground. Wife endorsing pos LOC for 2-3 minutes.   EMS was called yesterday but patient refused transfer to ER, pt coming in today with facial and headache. PT takes elequis for afib.   Pt has small well approximated laceration to L eye brown and swollen area to L temporal area of head.Pt alert and oriented and denies any strength changes and vision changes at this time.

## 2022-05-30 NOTE — ED Notes (Signed)
Pt given urinal as requested and food tray and drink. HOB adjusted for pt.

## 2022-05-30 NOTE — ED Provider Notes (Signed)
[facility] Provider Note    Event Date/Time   First MD Initiated Contact with Patient 05/30/22 1639     (approximate)   History   Chief Complaint: Fall   HPI  Kenneth Stevenson is a 76 y.o. male with a history of COPD, CHF, atrial fibrillation on Eliquis, alcohol abuse who was brought to the ED due to head trauma that occurred yesterday.  Patient was in his usual state of health, leaning over to pick something up off the floor in the kitchen when he lost his balance and fell forward hitting the left side of his face on the floor.  He was knocked out for a few minutes.  When he awoke he had pain lateral to the left eye and a laceration in that area.  He stayed home, and throughout today has had gradually worsening left-sided headache.  No vision change.  No paresthesias or motor weakness or neck pain.  Last took his Eliquis this morning.     Physical Exam   Triage Vital Signs: ED Triage Vitals  Enc Vitals Group     BP 05/30/22 1436 122/73     Pulse Rate 05/30/22 1436 80     Resp 05/30/22 1436 18     Temp 05/30/22 1436 97.6 F (36.4 C)     Temp src --      SpO2 05/30/22 1436 96 %     Weight 05/30/22 1437 175 lb (79.4 kg)     Height --      Head Circumference --      Peak Flow --      Pain Score 05/30/22 1436 5     Pain Loc --      Pain Edu? --      Excl. in [place]? --     Most recent vital signs: Vitals:   05/30/22 2145 05/30/22 2200  BP:  (!) 141/87  Pulse: 88   Resp: 14 16  Temp:    SpO2: 96%     General: Awake, no distress.  CV:  Good peripheral perfusion.  Irregularly irregular rhythm, rate of 90 Resp:  Normal effort.  Clear to auscultation Abd:  No distention.  Soft nontender Other:  For range of motion in all extremities.  Joints stable.  1 cm laceration at the left lateral orbital rim, which is healing, hemostatic.   ED Results / Procedures / Treatments   Labs (all labs ordered are listed, but only abnormal results are  displayed) Labs Reviewed  COMPREHENSIVE METABOLIC PANEL - Abnormal; Notable for the following components:      Result Value   Sodium 133 (*)    Glucose, Bld 109 (*)    Albumin 3.1 (*)    AST 76 (*)    Alkaline Phosphatase 138 (*)    Total Bilirubin 2.0 (*)    All other components within normal limits  CBC WITH DIFFERENTIAL/PLATELET - Abnormal; Notable for the following components:   RBC 3.48 (*)    Hemoglobin 11.8 (*)    HCT 35.2 (*)    MCV 101.1 (*)    RDW 19.4 (*)    All other components within normal limits  APTT - Abnormal; Notable for the following components:   aPTT 64 (*)    All other components within normal limits  PROTIME-INR - Abnormal; Notable for the following components:   Prothrombin Time 22.0 (*)    INR 1.9 (*)    All other components within normal limits  HEPARIN LEVEL (  UNFRACTIONATED)     EKG Interpreted by me Atrial fibrillation rate of 90.  Normal axis, normal intervals.  Normal QRS ST segments and T waves.   RADIOLOGY CT head interpreted by me, shows a thin right-sided subdural at the tentorium.  Radiology report reviewed.  CT cervical spine unremarkable.  6-hour repeat CT head stable without acute interval change.   PROCEDURES:  .Critical Care  Performed by: Carrie Mew, MD Authorized by: Carrie Mew, MD   Critical care provider statement:    Critical care time (minutes):  35   Critical care time was exclusive of:  Separately billable procedures and treating other patients   Critical care was necessary to treat or prevent imminent or life-threatening deterioration of the following conditions:  CNS failure or compromise   Critical care was time spent personally by me on the following activities:  Development of treatment plan with patient or surrogate, discussions with consultants, evaluation of patient's response to treatment, examination of patient, obtaining history from patient or surrogate, ordering and performing treatments  and interventions, ordering and review of laboratory studies, ordering and review of radiographic studies, pulse oximetry, re-evaluation of patient's condition and review of old charts    MEDICATIONS ORDERED IN ED: Medications  levETIRAcetam (KEPPRA) IVPB 1000 mg/100 mL premix (0 mg Intravenous Stopped 05/30/22 1825)  lactated ringers bolus 500 mL (0 mLs Intravenous Stopped 05/30/22 1824)     IMPRESSION / MDM / [place] / ED COURSE  I reviewed the triage vital signs and the nursing notes.                              Differential diagnosis includes, but is not limited to, intracranial hemorrhage, skull fracture, C-spine fracture, anemia, AKI, electrolyte abnormality  Patient's presentation is most consistent with acute presentation with potential threat to life or bodily function.  Patient presents with worsening headache after head trauma yesterday.  Found to have intracranial hemorrhage.  She is on Eliquis but last dose was this morning around 8 AM, and injury occurred almost 24 hours ago.  Discussed with neurosurgery Dr. Cari Caraway who agrees with not giving Eliquis reversal.  Will hold Eliquis for 2 weeks, start Keppra prophylaxis for 1 week.  Repeat CT head is stable without any ongoing bleeding.  follow-up with neurosurgery.       FINAL CLINICAL IMPRESSION(S) / ED DIAGNOSES   Final diagnoses:  Subdural hematoma ([place])     Rx / DC Orders   ED Discharge Orders          Ordered    levETIRAcetam (KEPPRA) 500 MG tablet  2 times daily        05/30/22 2317    ondansetron (ZOFRAN-ODT) 4 MG disintegrating tablet  Every 8 hours PRN        05/30/22 2317             Note:  This document was prepared using Dragon voice recognition software and may include unintentional dictation errors.   Carrie Mew, MD 05/30/22 2322

## 2022-05-30 NOTE — ED Notes (Signed)
Pt denies pain; HOB adjusted for pt; pt declined to have this RN dim lights for him; pt trying to sleep; call bell within reach; rail up; stretcher locked low; lead reattached as portion of cardiac monitor came loose; urinal emptied and given back to pt.

## 2022-06-02 ENCOUNTER — Telehealth: Payer: Self-pay

## 2022-06-02 ENCOUNTER — Telehealth: Payer: Self-pay | Admitting: Neurosurgery

## 2022-06-02 NOTE — Telephone Encounter (Signed)
        Patient  visited [facility] on 10/6     Telephone encounter attempt :  1sr  A HIPAA compliant voice message was left requesting a return call.  Instructed patient to call back    [facility] [employer]  [phone] [address], [place], [place] [address] Phone: [phone] Email: [email or web address]

## 2022-06-02 NOTE — Telephone Encounter (Signed)
That is fine as long as he is following up with [place] or them, but he needs to get a repeat CT in 2 weeks and follow up with Neurosurgery there or with [place] by telephone

## 2022-06-02 NOTE — Telephone Encounter (Signed)
I spoke to the daughter again to get clarification. She is going to try to have every thing done at the [facility]. She will call them tomorrow since today is a holiday and they are closed. She wants him to see his PCP at the [facility] and have them refer him to a neurosurgeon. If for some reason they can not get him in soon she will call the office back to have CT done here. She is aware that an auth from the [facility] will have to be received in order to be seen here for a CT scan and fu appt.

## 2022-06-02 NOTE — Telephone Encounter (Signed)
Patient was seen in the ER on 05/30/2022 for a subdural hematoma due to a fall.  Per Dr.Yarbrough "Telephone f/u with me in 2 weeks with head CT" I called mobile number on pt's chart. His daughter answered and he was there. Patient goes to the [facility] for all his medical treatment. She would rather him have all his follow-up appointments at the [facility] so it is paid by the [facility].

## 2022-06-03 ENCOUNTER — Encounter: Payer: No Typology Code available for payment source | Attending: Anesthesiology | Admitting: *Deleted

## 2022-06-03 DIAGNOSIS — R079 Chest pain, unspecified: Secondary | ICD-10-CM

## 2022-06-03 NOTE — Telephone Encounter (Signed)
Lesly Rubenstein pt's daughter called back to confirm that he will be getting all his care regarding the subdural hematoma with the [facility].

## 2022-06-03 NOTE — Progress Notes (Signed)
Initial phone call completed. Diagnosis can be found in [place] Visit in Media Tab. EP Orientation scheduled for Monday 10/23 at 10am.

## 2022-06-16 ENCOUNTER — Ambulatory Visit: Payer: No Typology Code available for payment source

## 2022-06-25 ENCOUNTER — Ambulatory Visit: Payer: No Typology Code available for payment source

## 2022-08-29 ENCOUNTER — Emergency Department: Payer: No Typology Code available for payment source

## 2022-08-29 ENCOUNTER — Other Ambulatory Visit: Payer: Self-pay

## 2022-08-29 ENCOUNTER — Inpatient Hospital Stay
Admission: EM | Admit: 2022-08-29 | Discharge: 2022-08-31 | DRG: 897 | Disposition: A | Discharge status: Home / Self Care | Payer: No Typology Code available for payment source | Attending: Internal Medicine | Admitting: Internal Medicine

## 2022-08-29 DIAGNOSIS — F10930 Alcohol use, unspecified with withdrawal, uncomplicated: Secondary | ICD-10-CM

## 2022-08-29 DIAGNOSIS — E785 Hyperlipidemia, unspecified: Secondary | ICD-10-CM | POA: Diagnosis present

## 2022-08-29 DIAGNOSIS — Z79899 Other long term (current) drug therapy: Secondary | ICD-10-CM | POA: Diagnosis not present

## 2022-08-29 DIAGNOSIS — K2270 Barrett's esophagus without dysplasia: Secondary | ICD-10-CM | POA: Diagnosis present

## 2022-08-29 DIAGNOSIS — Z7902 Long term (current) use of antithrombotics/antiplatelets: Secondary | ICD-10-CM

## 2022-08-29 DIAGNOSIS — Z951 Presence of aortocoronary bypass graft: Secondary | ICD-10-CM

## 2022-08-29 DIAGNOSIS — F10139 Alcohol abuse with withdrawal, unspecified: Secondary | ICD-10-CM | POA: Diagnosis present

## 2022-08-29 DIAGNOSIS — F10939 Alcohol use, unspecified with withdrawal, unspecified: Secondary | ICD-10-CM

## 2022-08-29 DIAGNOSIS — Z8679 Personal history of other diseases of the circulatory system: Secondary | ICD-10-CM

## 2022-08-29 DIAGNOSIS — M353 Polymyalgia rheumatica: Secondary | ICD-10-CM | POA: Diagnosis present

## 2022-08-29 DIAGNOSIS — K7030 Alcoholic cirrhosis of liver without ascites: Secondary | ICD-10-CM | POA: Diagnosis present

## 2022-08-29 DIAGNOSIS — K219 Gastro-esophageal reflux disease without esophagitis: Secondary | ICD-10-CM | POA: Diagnosis present

## 2022-08-29 DIAGNOSIS — Z1152 Encounter for screening for COVID-19: Secondary | ICD-10-CM | POA: Diagnosis not present

## 2022-08-29 DIAGNOSIS — M109 Gout, unspecified: Secondary | ICD-10-CM | POA: Diagnosis present

## 2022-08-29 DIAGNOSIS — E7220 Disorder of urea cycle metabolism, unspecified: Secondary | ICD-10-CM | POA: Diagnosis present

## 2022-08-29 DIAGNOSIS — R000 Tachycardia, unspecified: Secondary | ICD-10-CM | POA: Diagnosis present

## 2022-08-29 DIAGNOSIS — I110 Hypertensive heart disease with heart failure: Secondary | ICD-10-CM | POA: Diagnosis present

## 2022-08-29 DIAGNOSIS — I482 Chronic atrial fibrillation, unspecified: Secondary | ICD-10-CM | POA: Diagnosis present

## 2022-08-29 DIAGNOSIS — M4306 Spondylolysis, lumbar region: Secondary | ICD-10-CM | POA: Diagnosis present

## 2022-08-29 DIAGNOSIS — I2510 Atherosclerotic heart disease of native coronary artery without angina pectoris: Secondary | ICD-10-CM | POA: Diagnosis present

## 2022-08-29 DIAGNOSIS — M1990 Unspecified osteoarthritis, unspecified site: Secondary | ICD-10-CM | POA: Diagnosis present

## 2022-08-29 DIAGNOSIS — Z8619 Personal history of other infectious and parasitic diseases: Secondary | ICD-10-CM | POA: Diagnosis not present

## 2022-08-29 DIAGNOSIS — Z7901 Long term (current) use of anticoagulants: Secondary | ICD-10-CM

## 2022-08-29 DIAGNOSIS — J449 Chronic obstructive pulmonary disease, unspecified: Secondary | ICD-10-CM | POA: Diagnosis present

## 2022-08-29 DIAGNOSIS — F1010 Alcohol abuse, uncomplicated: Secondary | ICD-10-CM | POA: Diagnosis present

## 2022-08-29 DIAGNOSIS — Z7982 Long term (current) use of aspirin: Secondary | ICD-10-CM

## 2022-08-29 DIAGNOSIS — Z888 Allergy status to other drugs, medicaments and biological substances status: Secondary | ICD-10-CM

## 2022-08-29 DIAGNOSIS — Z8719 Personal history of other diseases of the digestive system: Secondary | ICD-10-CM

## 2022-08-29 DIAGNOSIS — R569 Unspecified convulsions: Secondary | ICD-10-CM

## 2022-08-29 DIAGNOSIS — I509 Heart failure, unspecified: Secondary | ICD-10-CM | POA: Diagnosis present

## 2022-08-29 DIAGNOSIS — R410 Disorientation, unspecified: Secondary | ICD-10-CM

## 2022-08-29 DIAGNOSIS — I10 Essential (primary) hypertension: Secondary | ICD-10-CM | POA: Diagnosis present

## 2022-08-29 LAB — COMPREHENSIVE METABOLIC PANEL
ALT: 28 U/L (ref 0–44)
AST: 66 U/L — ABNORMAL HIGH (ref 15–41)
Albumin: 3 g/dL — ABNORMAL LOW (ref 3.5–5.0)
Alkaline Phosphatase: 117 U/L (ref 38–126)
Anion gap: 12 (ref 5–15)
BUN: 10 mg/dL (ref 8–23)
CO2: 21 mmol/L — ABNORMAL LOW (ref 22–32)
Calcium: 8.8 mg/dL — ABNORMAL LOW (ref 8.9–10.3)
Chloride: 100 mmol/L (ref 98–111)
Creatinine, Ser: 0.91 mg/dL (ref 0.61–1.24)
GFR, Estimated: >60 mL/min (ref >60)
Glucose, Bld: 132 mg/dL — ABNORMAL HIGH (ref 70–99)
Potassium: 3.8 mmol/L (ref 3.5–5.1)
Sodium: 133 mmol/L — ABNORMAL LOW (ref 135–145)
Total Bilirubin: 4.8 mg/dL — ABNORMAL HIGH (ref 0.3–1.2)
Total Protein: 7.5 g/dL (ref 6.5–8.1)

## 2022-08-29 LAB — URINALYSIS, COMPLETE (UACMP) WITH MICROSCOPIC
Bacteria, UA: NONE SEEN
Bilirubin Urine: NEGATIVE
Glucose, UA: NEGATIVE mg/dL
Hgb urine dipstick: NEGATIVE
Ketones, ur: NEGATIVE mg/dL
Leukocytes,Ua: NEGATIVE
Nitrite: NEGATIVE
Protein, ur: NEGATIVE mg/dL
Specific Gravity, Urine: 1.006 (ref 1.005–1.030)
Squamous Epithelial / HPF: NONE SEEN /HPF (ref 0–5)
pH: 8 (ref 5.0–8.0)

## 2022-08-29 LAB — CBC WITH DIFFERENTIAL/PLATELET
Abs Immature Granulocytes: 0.03 10*3/uL (ref 0.00–0.07)
Basophils Absolute: 0 10*3/uL (ref 0.0–0.1)
Basophils Relative: 1 %
Eosinophils Absolute: 0.2 10*3/uL (ref 0.0–0.5)
Eosinophils Relative: 3 %
HCT: 33.1 % — ABNORMAL LOW (ref 39.0–52.0)
Hemoglobin: 11 g/dL — ABNORMAL LOW (ref 13.0–17.0)
Immature Granulocytes: 1 %
Lymphocytes Relative: 19 %
Lymphs Abs: 1.2 10*3/uL (ref 0.7–4.0)
MCH: 37 pg — ABNORMAL HIGH (ref 26.0–34.0)
MCHC: 33.2 g/dL (ref 30.0–36.0)
MCV: 111.4 fL — ABNORMAL HIGH (ref 80.0–100.0)
Monocytes Absolute: 0.6 10*3/uL (ref 0.1–1.0)
Monocytes Relative: 10 %
Neutro Abs: 4.4 10*3/uL (ref 1.7–7.7)
Neutrophils Relative %: 66 %
Platelets: 101 10*3/uL — ABNORMAL LOW (ref 150–400)
RBC: 2.97 MIL/uL — ABNORMAL LOW (ref 4.22–5.81)
RDW: 16.1 % — ABNORMAL HIGH (ref 11.5–15.5)
Smear Review: NORMAL
WBC: 6.4 10*3/uL (ref 4.0–10.5)
nRBC: 0 % (ref 0.0–0.2)

## 2022-08-29 LAB — APTT: aPTT: 65 seconds — ABNORMAL HIGH (ref 24–36)

## 2022-08-29 LAB — RESP PANEL BY RT-PCR (RSV, FLU A&B, COVID)  RVPGX2
Influenza A by PCR: NEGATIVE
Influenza B by PCR: NEGATIVE
Resp Syncytial Virus by PCR: NEGATIVE
SARS Coronavirus 2 by RT PCR: NEGATIVE

## 2022-08-29 LAB — URINE DRUG SCREEN, QUALITATIVE (ARMC ONLY)
Amphetamines, Ur Screen: NOT DETECTED
Barbiturates, Ur Screen: NOT DETECTED
Benzodiazepine, Ur Scrn: NOT DETECTED
Cannabinoid 50 Ng, Ur ~~LOC~~: NOT DETECTED
Cocaine Metabolite,Ur ~~LOC~~: NOT DETECTED
MDMA (Ecstasy)Ur Screen: NOT DETECTED
Methadone Scn, Ur: NOT DETECTED
Opiate, Ur Screen: NOT DETECTED
Phencyclidine (PCP) Ur S: NOT DETECTED
Tricyclic, Ur Screen: NOT DETECTED

## 2022-08-29 LAB — LIPASE, BLOOD: Lipase: 31 U/L (ref 11–51)

## 2022-08-29 LAB — LACTIC ACID, PLASMA
Lactic Acid, Venous: 6.6 mmol/L (ref 0.5–1.9)
Lactic Acid, Venous: 2 mmol/L (ref 0.5–1.9)

## 2022-08-29 LAB — AMMONIA: Ammonia: 91 umol/L — ABNORMAL HIGH (ref 9–35)

## 2022-08-29 LAB — ETHANOL: Alcohol, Ethyl (B): <10 mg/dL (ref <10)

## 2022-08-29 LAB — MAGNESIUM: Magnesium: 1.5 mg/dL — ABNORMAL LOW (ref 1.7–2.4)

## 2022-08-29 LAB — PROTIME-INR
INR: 2 — ABNORMAL HIGH (ref 0.8–1.2)
Prothrombin Time: 22.7 seconds — ABNORMAL HIGH (ref 11.4–15.2)

## 2022-08-29 LAB — PHOSPHORUS: Phosphorus: 2.1 mg/dL — ABNORMAL LOW (ref 2.5–4.6)

## 2022-08-29 LAB — TROPONIN I (HIGH SENSITIVITY): Troponin I (High Sensitivity): 15 ng/L (ref <18)

## 2022-08-29 LAB — PROCALCITONIN: Procalcitonin: <0.1 ng/mL

## 2022-08-29 MED ORDER — VITAMIN B-12 100 MCG PO TABS: 100.0000 ug | ORAL_TABLET | Freq: Every day | ORAL | Status: DC

## 2022-08-29 MED ORDER — APIXABAN 5 MG PO TABS
5.0000 mg | ORAL_TABLET | Freq: Two times a day (BID) | ORAL | Status: DC
  Administered 2022-08-29 – 2022-08-31 (×5): 5 mg via ORAL
  Filled 2022-08-29 (×5): qty 1

## 2022-08-29 MED ORDER — LORAZEPAM 2 MG/ML IJ SOLN
1.0000 mg | Freq: Once | INTRAMUSCULAR | Status: AC
  Administered 2022-08-29: 1 mg via INTRAVENOUS
  Filled 2022-08-29: qty 1

## 2022-08-29 MED ORDER — THIAMINE HCL 100 MG/ML IJ SOLN
100.0000 mg | Freq: Once | INTRAMUSCULAR | Status: AC
  Administered 2022-08-29: 100 mg via INTRAVENOUS
  Filled 2022-08-29: qty 2

## 2022-08-29 MED ORDER — FOLIC ACID 1 MG PO TABS
1.0000 mg | ORAL_TABLET | Freq: Every day | ORAL | Status: DC
  Administered 2022-08-29 – 2022-08-31 (×3): 1 mg via ORAL
  Filled 2022-08-29 (×3): qty 1

## 2022-08-29 MED ORDER — LACTULOSE 10 GM/15ML PO SOLN
20.0000 g | Freq: Once | ORAL | Status: AC
  Administered 2022-08-29: 20 g via ORAL
  Filled 2022-08-29: qty 30

## 2022-08-29 MED ORDER — PROCHLORPERAZINE EDISYLATE 10 MG/2ML IJ SOLN: 10.0000 mg | Freq: Four times a day (QID) | INTRAMUSCULAR | Status: DC | PRN

## 2022-08-29 MED ORDER — CARVEDILOL 6.25 MG PO TABS: 12.5000 mg | ORAL_TABLET | Freq: Two times a day (BID) | ORAL | Status: DC

## 2022-08-29 MED ORDER — ALLOPURINOL 100 MG PO TABS
100.0000 mg | ORAL_TABLET | Freq: Every day | ORAL | Status: DC
  Administered 2022-08-29 – 2022-08-31 (×3): 100 mg via ORAL
  Filled 2022-08-29 (×3): qty 1

## 2022-08-29 MED ORDER — LORAZEPAM 1 MG PO TABS: 1.0000 mg | ORAL_TABLET | ORAL | Status: DC | PRN

## 2022-08-29 MED ORDER — THIAMINE HCL 100 MG/ML IJ SOLN
100.0000 mg | Freq: Every day | INTRAMUSCULAR | Status: DC
  Filled 2022-08-29: qty 2

## 2022-08-29 MED ORDER — MAGNESIUM SULFATE 2 GM/50ML IV SOLN
2.0000 g | Freq: Once | INTRAVENOUS | Status: AC
  Administered 2022-08-29: 2 g via INTRAVENOUS
  Filled 2022-08-29: qty 50

## 2022-08-29 MED ORDER — LORAZEPAM 2 MG PO TABS: 0.0000 mg | ORAL_TABLET | ORAL | Status: AC

## 2022-08-29 MED ORDER — LEVETIRACETAM 500 MG PO TABS
500.0000 mg | ORAL_TABLET | Freq: Two times a day (BID) | ORAL | Status: DC
  Administered 2022-08-29 – 2022-08-31 (×5): 500 mg via ORAL
  Filled 2022-08-29 (×5): qty 1

## 2022-08-29 MED ORDER — SODIUM CHLORIDE 0.9 % IV BOLUS
1000.0000 mL | Freq: Once | INTRAVENOUS | Status: AC
  Administered 2022-08-29: 1000 mL via INTRAVENOUS

## 2022-08-29 MED ORDER — SODIUM CHLORIDE 0.9 % IV BOLUS
500.0000 mL | Freq: Once | INTRAVENOUS | Status: AC
  Administered 2022-08-29: 500 mL via INTRAVENOUS

## 2022-08-29 MED ORDER — ARFORMOTEROL TARTRATE 15 MCG/2ML IN NEBU
15.0000 ug | INHALATION_SOLUTION | Freq: Two times a day (BID) | RESPIRATORY_TRACT | Status: DC
  Administered 2022-08-29 – 2022-08-31 (×5): 15 ug via RESPIRATORY_TRACT
  Filled 2022-08-29 (×7): qty 2

## 2022-08-29 MED ORDER — K PHOS MONO-SOD PHOS DI & MONO 155-852-130 MG PO TABS
500.0000 mg | ORAL_TABLET | Freq: Four times a day (QID) | ORAL | Status: AC
  Administered 2022-08-29 (×3): 500 mg via ORAL
  Filled 2022-08-29 (×5): qty 2

## 2022-08-29 MED ORDER — ADULT MULTIVITAMIN W/MINERALS CH
1.0000 | ORAL_TABLET | Freq: Every day | ORAL | Status: DC
  Administered 2022-08-29 – 2022-08-31 (×3): 1 via ORAL
  Filled 2022-08-29 (×3): qty 1

## 2022-08-29 MED ORDER — THIAMINE MONONITRATE 100 MG PO TABS
100.0000 mg | ORAL_TABLET | Freq: Every day | ORAL | Status: DC
  Administered 2022-08-30 – 2022-08-31 (×2): 100 mg via ORAL
  Filled 2022-08-29 (×2): qty 1

## 2022-08-29 MED ORDER — VITAMIN B-12 1000 MCG PO TABS
1000.0000 ug | ORAL_TABLET | Freq: Every day | ORAL | Status: DC
  Administered 2022-08-29 – 2022-08-31 (×3): 1000 ug via ORAL
  Filled 2022-08-29 (×2): qty 2
  Filled 2022-08-29: qty 1

## 2022-08-29 MED ORDER — AMLODIPINE BESYLATE 5 MG PO TABS
5.0000 mg | ORAL_TABLET | Freq: Every day | ORAL | Status: DC
  Administered 2022-08-29 – 2022-08-31 (×2): 5 mg via ORAL
  Filled 2022-08-29 (×3): qty 1

## 2022-08-29 MED ORDER — METOPROLOL SUCCINATE ER 50 MG PO TB24: 200.0000 mg | ORAL_TABLET | Freq: Every day | ORAL | Status: DC

## 2022-08-29 MED ORDER — PANTOPRAZOLE SODIUM 40 MG PO TBEC
40.0000 mg | DELAYED_RELEASE_TABLET | Freq: Every day | ORAL | Status: DC
  Administered 2022-08-29 – 2022-08-31 (×3): 40 mg via ORAL
  Filled 2022-08-29 (×3): qty 1

## 2022-08-29 MED ORDER — UMECLIDINIUM BROMIDE 62.5 MCG/ACT IN AEPB
1.0000 | INHALATION_SPRAY | Freq: Every day | RESPIRATORY_TRACT | Status: DC
  Administered 2022-08-30 – 2022-08-31 (×2): 1 via RESPIRATORY_TRACT
  Filled 2022-08-29 (×3): qty 7

## 2022-08-29 MED ORDER — CARVEDILOL 25 MG PO TABS
25.0000 mg | ORAL_TABLET | Freq: Two times a day (BID) | ORAL | Status: DC
  Administered 2022-08-29 – 2022-08-31 (×3): 25 mg via ORAL
  Filled 2022-08-29 (×4): qty 1

## 2022-08-29 MED ORDER — PANTOPRAZOLE SODIUM 40 MG PO TBEC: 40.0000 mg | DELAYED_RELEASE_TABLET | Freq: Every day | ORAL | Status: DC

## 2022-08-29 MED ORDER — LISINOPRIL 20 MG PO TABS
40.0000 mg | ORAL_TABLET | Freq: Every day | ORAL | Status: DC
  Administered 2022-08-29 – 2022-08-31 (×2): 40 mg via ORAL
  Filled 2022-08-29 (×2): qty 4
  Filled 2022-08-29: qty 2

## 2022-08-29 MED ORDER — LORAZEPAM 2 MG PO TABS: 0.0000 mg | ORAL_TABLET | Freq: Three times a day (TID) | ORAL | Status: DC

## 2022-08-29 MED ORDER — LACTATED RINGERS IV BOLUS: 1000.0000 mL | Freq: Once | INTRAVENOUS | Status: DC

## 2022-08-29 MED ORDER — CLOPIDOGREL BISULFATE 75 MG PO TABS
75.0000 mg | ORAL_TABLET | Freq: Every day | ORAL | Status: DC
  Administered 2022-08-29 – 2022-08-31 (×3): 75 mg via ORAL
  Filled 2022-08-29 (×3): qty 1

## 2022-08-29 MED ORDER — OXYCODONE HCL 5 MG PO TABS
5.0000 mg | ORAL_TABLET | Freq: Once | ORAL | Status: AC
  Administered 2022-08-29: 5 mg via ORAL
  Filled 2022-08-29: qty 1

## 2022-08-29 NOTE — ED Provider Notes (Signed)
[facility] Provider Note    Event Date/Time   First MD Initiated Contact with Patient 08/29/22 [phone]     (approximate)   History   Seizures   HPI Level 5 caveat:  history/ROS limited by altered mental status/postictal state  Kenneth Stevenson is a 77 y.o. male who presents by EMS for evaluation after seizure-like activity at home.  His wife reported to the paramedics that they were asleep and then she woke up and realized that he was jerking and twitching as if he was having a seizure.  He has no prior history of seizures.  He has had no recent illnesses or injuries, however the medical record indicates that he was admitted for a subdural hematoma about 3 months ago.  The patient is confused and unable to provide any history.  He says that he is not in any pain and he denies shortness of breath.  He is not able to provide any additional information.     Physical Exam   Triage Vital Signs: ED Triage Vitals  Enc Vitals Group     BP 08/29/22 0620 (!) 154/86     Pulse Rate 08/29/22 0620 (!) 106     Resp 08/29/22 0620 20     Temp 08/29/22 0620 98.7 F (37.1 C)     Temp Source 08/29/22 0620 Oral     SpO2 08/29/22 0620 96 %     Weight 08/29/22 0619 79.4 kg (175 lb)     Height 08/29/22 0619 1.803 m ('5\' 11"'$ )     Head Circumference --      Peak Flow --      Pain Score --      Pain Loc --      Pain Edu? --      Excl. in [place]? --     Most recent vital signs: Vitals:   08/29/22 0620 08/29/22 0653  BP: (!) 154/86 (!) 153/90  Pulse: (!) 106 (!) 103  Resp: 20 20  Temp: 98.7 F (37.1 C)   SpO2: 96% 97%     General: Awake, alert but confused.  Trouble following commands. CV:  Good peripheral perfusion.  Irregularly irregular rhythm, slightly elevated rate.  Normal heart sounds. Resp:  Normal effort.  No difficulty breathing.  Lungs are clear to auscultation. Abd:  No distention.  No tenderness to palpation of the abdomen. Skin:  Patient appears jaundiced  or at least has a skin tone and pallor most associated with an consistent with a history of liver disease. Other:  Old and well-healed sternotomy scar with several hard protrusions beneath the skin that appear chronic and are nontender to palpation.  Patient is moving all 4 extremities but is clearly confused and unable to process what is going on around him.  He has some dried blood on the tip of his nose but no obvious injuries.  No tenderness to palpation of his cervical spine.    ED Results / Procedures / Treatments   Labs (all labs ordered are listed, but only abnormal results are displayed) Labs Reviewed  LACTIC ACID, PLASMA - Abnormal; Notable for the following components:      Result Value   Lactic Acid, Venous 6.6 (*)    All other components within normal limits  COMPREHENSIVE METABOLIC PANEL - Abnormal; Notable for the following components:   Sodium 133 (*)    CO2 21 (*)    Glucose, Bld 132 (*)    Calcium 8.8 (*)  Albumin 3.0 (*)    AST 66 (*)    Total Bilirubin 4.8 (*)    All other components within normal limits  CBC WITH DIFFERENTIAL/PLATELET - Abnormal; Notable for the following components:   RBC 2.97 (*)    Hemoglobin 11.0 (*)    HCT 33.1 (*)    MCV 111.4 (*)    MCH 37.0 (*)    RDW 16.1 (*)    Platelets 101 (*)    All other components within normal limits  PROTIME-INR - Abnormal; Notable for the following components:   Prothrombin Time 22.7 (*)    INR 2.0 (*)    All other components within normal limits  APTT - Abnormal; Notable for the following components:   aPTT 65 (*)    All other components within normal limits  AMMONIA - Abnormal; Notable for the following components:   Ammonia 91 (*)    All other components within normal limits  MAGNESIUM - Abnormal; Notable for the following components:   Magnesium 1.5 (*)    All other components within normal limits  URINE CULTURE  CULTURE, BLOOD (SINGLE)  RESP PANEL BY RT-PCR (RSV, FLU A&B, COVID)  RVPGX2   LIPASE, BLOOD  LACTIC ACID, PLASMA  URINALYSIS, COMPLETE (UACMP) WITH MICROSCOPIC  URINE DRUG SCREEN, QUALITATIVE ([facility] ONLY)  ETHANOL  TROPONIN I (HIGH SENSITIVITY)     EKG  ED ECG REPORT I, Hinda Kehr, the attending physician, personally viewed and interpreted this ECG.  Date: 08/29/2022 EKG Time: 6:18 AM Rate: 108 Rhythm: Atrial fibrillation QRS Axis: normal Intervals: Abnormal intervals due to A-fib as well as a borderline prolonged QTc interval at 495 ms ST/T Wave abnormalities: Non-specific ST segment / T-wave changes, but no clear evidence of acute ischemia. Narrative Interpretation: no definitive evidence of acute ischemia; does not meet STEMI criteria.    RADIOLOGY I viewed and interpreted the patient's head CT without contrast.  See hospital course for details, but in short there are no acute abnormalities on the head CT.    Chest x-ray pending at the time of transfer of care to Dr. Ellender Hose    PROCEDURES:  Critical Care performed: No  .1-3 Lead EKG Interpretation  Performed by: Hinda Kehr, MD Authorized by: Hinda Kehr, MD     Interpretation: abnormal     ECG rate:  108   ECG rate assessment: tachycardic     Rhythm: atrial fibrillation     Ectopy: none     Conduction: normal      MEDICATIONS ORDERED IN ED: Medications  lactulose (CHRONULAC) 10 GM/15ML solution 20 g (has no administration in time range)  lactated ringers bolus 1,000 mL (has no administration in time range)     IMPRESSION / MDM / ASSESSMENT AND PLAN / ED COURSE  I reviewed the triage vital signs and the nursing notes.                              Differential diagnosis includes, but is not limited to, acute intracranial bleed, medication or drug side effect, alcohol withdrawal seizure, electrolyte or metabolic abnormality, hepatic encephalopathy, renal failure.  Patient's presentation is most consistent with acute presentation with potential threat to life or bodily  function.  Lab/studies ordered: Respiratory viral panel, ammonia level, single blood culture, urinalysis, urine culture, APTT, pro time-INR, CBC with differential, lactic acid, magnesium, high-sensitivity troponin, lipase, urinalysis, urine drug screen, ethanol level, CMP, head CT without contrast, 1 view chest  x-ray.  Given the lack of history and specifics about what happened tonight, I am initiating a "possible sepsis" workup, as well as evaluation for other possible sources of seizure such as ethanol level, urine drug screen, etc.  Head CT is pending.  EKG shows A-fib but is nonischemic.  Vital signs are generally normal other than mild tachycardia  I reviewed the medical record including his records from the [facility] and prior visits to [facility].  I have verified that the patient had admission about 3 months ago for a subdural hemorrhage.  He has had multiple prior visits for falls and lacerations and he has a documented history of alcohol abuse as well as chronic atrial fibrillation on Eliquis.  This increases my suspicion he may have had a fall unbeknownst to his wife and may have another acute intracranial bleed.  Seizure precautions have been initiated but given his advanced age at 24 years and his current confusion/altered mental status, I will hold off on any benzodiazepines.  The patient is on the cardiac monitor to evaluate for evidence of arrhythmia and/or significant heart rate changes.   Clinical Course as of 08/29/22 0729  Fri Aug 29, 2022  6213 CT Head Wo Contrast I viewed and interpreted the patient's CT head.  No acute bleed or other evidence of trauma.  I also read the radiologist's report, which confirmed no acute findings. [CF]  0656 Ammonia(!): 91 Elevated ammonia level at 91.  Unable to establish what is his baseline because no lab results are available in care everywhere.  I am ordering lactulose 20 g p.o. [CF]  0726 Lactic Acid, Venous(!!): 6.6 The patient is noted to  have a lactate>4. With the current information available to me, I don't think the patient is in septic shock. The lactate>4, is related to seizure activity and/or  liver failure.  I ordered 1 L of LR and a lactic acid will be repeated after fluid resuscitation.  [CF]  [id number] Transferring ED care to Dr. Ellender Hose to reassess and disposition appropriately. [CF]    Clinical Course User Index [CF] Hinda Kehr, MD     FINAL CLINICAL IMPRESSION(S) / ED DIAGNOSES   Final diagnoses:  Seizure-like activity ([place])  Confusion  Chronic atrial fibrillation ([place])  Current use of long term anticoagulation  Hyperammonemia ([place])  History of liver failure     Rx / DC Orders   ED Discharge Orders     None        Note:  This document was prepared using Dragon voice recognition software and may include unintentional dictation errors.   Hinda Kehr, MD 08/29/22 [phone]

## 2022-08-29 NOTE — ED Notes (Signed)
Pt presents to ED with/co of seizure like activity at home. Pt does state he is a ETOH everyday drinker and does endorse he has been "slowing down". Daughter states he has still bee drinking daily but is unsure of how much he has cut back. Pt does endorse HX of cirrhosis and has been f/up with PCP and "other doctors to help me".   Both pt and daughter state pt has no HX of seizures.   Pt is oriented to person place and time at this time.   Pt shows no s/s of distress. Pt denies pain. Pt is A&Ox4 at this time.   Wife and daughter at bedside.

## 2022-08-29 NOTE — ED Provider Notes (Signed)
78 yo M with PMHx alcohol abuse, cirrhosis, here with new onset seizure. Suspect EtOH w/d seizure with underlying age-related degenerative changes. He has no new focal neuro deficits. LA elevation is likely 2/2 combination of type 2 lactic acidosis and seizure. No signs of shock or sepsis. IVF, thiamine, and ativan given. Lactulose ordered. Will admit to medicine.  COVID negative. EtOH negative. CT head negative, reviewed by me. CXR with ? Infiltrate or atelectasis. No hypoxia or cough, will check procal. COVID/flu negative. Pt is back to mental baseline. Will admit to medicine.   Duffy Bruce, MD 08/29/22 [phone]

## 2022-08-29 NOTE — ED Notes (Signed)
Lab called to add on UDS to urine

## 2022-08-29 NOTE — H&P (Addendum)
History and Physical    Patient: Kenneth Stevenson CWC:[id number] DOB: 03/16/1946 DOA: 08/29/2022 DOS: the patient was seen and examined on 08/29/2022 PCP: [facility] [place]  Patient coming from: Home  Chief Complaint:  Chief Complaint  Patient presents with   Seizures   HPI: Kenneth Stevenson is a 77 y.o. male with medical history significant of unspecified CHF, hypertension, CAD, history of triple-vessel CABG, chronic atrial fibrillation on apixaban, hypertension, syncope and collapse, gout, polymyalgia rheumatica, rotator cuff tear, pathological fracture of the hip, subdural hemorrhage 3 months ago, osteoarthritis, spondylolysis of the lumbar region, senile nuclear sclerosis, hepatitis A, alcoholic liver cirrhosis, alcohol withdrawal seizures, alcohol dependence who has been trying to decrease his alcohol consumption since around Christmas and is only drinking 2 beers a day now and no longer drinking liquor according to the patient.  He was sleeping and his wife awoke next to him and noticed send he was having seizure like activity.  When EMS arrived they describe the patient and appearing postictal. He denied fever, chills, rhinorrhea, sore throat, wheezing or hemoptysis.  He has a hide occasional mild epistaxis.  Recent episodes of palpitations, but no chest pain, diaphoresis, PND, orthopnea or pitting edema of the lower extremities.  No abdominal pain, nausea, emesis, diarrhea, constipation, melena or hematochezia.  No flank pain, dysuria, frequency or hematuria.  No polyuria, polydipsia, polyphagia or blurred vision.   ED course: Initial vital signs were temperature 98.7 F, pulse 76, respiration 20, BP 154/86 mmHg O2 sat 96% on room air.  The patient received magnesium sulfate 2 g IVPB, lorazepam 1 mg IVPB, lactulose 20 g p.o. x 1, NS 1500 mL bolus and 100 mg of thiamine.  I ordered oxycodone 5 mg p.o. x 1 for glossal trauma.  Labwork: CBC showed a white count 6.4, hemoglobin 11.0 g/dL  platelets 101.  PT 22.7, INR 2.0 and PTT 66.  Ammonia level was 91 mol/L.  Lactic acid 6.6 and then 2.0 mmol/L.  Magnesium was 1.5 and phosphorus 2.1 mg/dL.  Normal troponin, lipase and alcohol level.  Negative coronavirus, influenza and RSV PCR.  CMP showed sodium 133 and CO2 of 21 mmol/L with a normal anion gap.  Normal potassium, chloride and renal function.  Total bilirubin 4.8 mg/dL.  Albumin was 3.0 g/dL and AST 66 units/L.  The rest of the LFTs and calcium were normal after correcting to albumin level.  Imaging: Portable 1 view chest radiograph with low volume with vascular congestion interstitial edema.  Patchy airspace disease at the bases may be dependent edema or pneumonia.  CT head without contrast showed an aging brain without acute or focal finding.   Review of Systems: As mentioned in the history of present illness. All other systems reviewed and are negative. Past Medical History:  Diagnosis Date   CHF (congestive heart failure) (HCC)    Hypertension    S/P CABG x 3    History reviewed. No pertinent surgical history. Social History:  reports that he has never smoked. He has never used smokeless tobacco. He reports current alcohol use. He reports that he does not use drugs.  Allergies  Allergen Reactions   Simvastatin    Empagliflozin Other (See Comments)    Other reaction(s): Tremor, Disorientated, Increased frequency of urination    History reviewed. No pertinent family history.  Prior to Admission medications   Medication Sig Start Date End Date Taking? Authorizing Provider  albuterol (PROVENTIL HFA;VENTOLIN HFA) 108 (90 Base) MCG/ACT inhaler Inhale 1 puff into the  lungs every 6 (six) hours as needed for wheezing or shortness of breath.    [provider]  alendronate (FOSAMAX) 70 MG tablet TAKE ONE TABLET BY MOUTH ONCE WEEKLY TAKE FIRST THING IN MORNING WITH 8 OUNCES OF WATER 30 MINUTES BEFORE FOOD OR DRINK.REMAIN UPRIGHT FOR 30 MINUTES. 09/05/20   [provider]  allopurinol (ZYLOPRIM) 100 MG tablet Take 100 mg by mouth daily.    [provider]  amLODipine (NORVASC) 5 MG tablet Take 5 mg by mouth daily.    [provider]  apixaban (ELIQUIS) 5 MG TABS tablet Take 5 mg by mouth 2 (two) times daily.    [provider]  aspirin 81 MG tablet Take 81 mg by mouth daily. Patient not taking: Reported on 05/30/2022    [provider]  atorvastatin (LIPITOR) 80 MG tablet Take 80 mg by mouth daily.    [provider]  carvedilol (COREG) 12.5 MG tablet Take 12.5 mg by mouth 2 (two) times daily with a meal.    [provider]  cetirizine (ZYRTEC) 10 MG tablet TAKE ONE TABLET BY MOUTH EVERY DAY AS NEEDED FOR ITCHINESS 10/23/20   [provider]  clopidogrel (PLAVIX) 75 MG tablet Take 75 mg by mouth daily.    [provider]  colchicine 0.6 MG tablet 2 tablets stat, then 1 tablet tid until relief or stomach upset 09/13/16 05/30/22  Johnn Hai, PA-C  cyanocobalamin 100 MCG tablet Take 1 tablet by mouth daily. 06/24/05   [provider]  diclofenac Sodium (VOLTAREN) 1 % GEL APPLY 4 GRAMS TOPICALLY EVERY 6 HOURS FOR BACK PAIN 10/23/20   [provider]  HYDROcodone-acetaminophen (NORCO/VICODIN) 5-325 MG tablet Take 1 tablet by mouth every 4 (four) hours as needed for moderate pain. Patient not taking: Reported on 05/30/2022 09/13/16   Johnn Hai, PA-C  levETIRAcetam (KEPPRA) 500 MG tablet Take 1 tablet (500 mg total) by mouth 2 (two) times daily for 7 days. 05/30/22 06/06/22  Carrie Mew, MD  lisinopril (PRINIVIL,ZESTRIL) 40 MG tablet Take 40 mg by mouth daily.    [provider]  metoprolol (TOPROL-XL) 200 MG 24 hr tablet Take 200 mg by mouth daily.    [provider]  naltrexone (DEPADE) 50 MG tablet Take 1 tablet by mouth daily. Patient not taking: Reported on 05/30/2022 06/20/21   [provider]  ondansetron (ZOFRAN-ODT) 4 MG  disintegrating tablet Take 1 tablet (4 mg total) by mouth every 8 (eight) hours as needed for nausea or vomiting. 05/30/22   Carrie Mew, MD  prednisoLONE acetate (PRED FORTE) 1 % ophthalmic suspension INSTILL 1 DROP IN RIGHT EYE FOUR TIMES A DAY Patient not taking: Reported on 05/30/2022 06/17/21   [provider]  predniSONE (DELTASONE) 20 MG tablet Take 2 tablets (40 mg total) by mouth daily. Patient not taking: Reported on 05/30/2022 06/17/15   Hinda Kehr, MD  pyridOXINE (VITAMIN B-6) 100 MG tablet Take 100 mg by mouth daily.    [provider]  Specialty Vitamins Products (MAGNESIUM, AMINO ACID CHELATE,) 133 MG tablet Take 1 tablet by mouth 2 (two) times daily.    [provider]  Tiotropium Bromide-Olodaterol (STIOLTO RESPIMAT) 2.5-2.5 MCG/ACT AERS Inhale 2 each into the lungs daily.    [provider]  triamcinolone ointment (KENALOG) 0.5 % Apply 1 application topically 2 (two) times daily. 09/13/16   Johnn Hai, PA-C  vitamin B-12 (CYANOCOBALAMIN) 1000 MCG tablet Take 1,000 mcg by mouth daily.  [provider]    Physical Exam: Vitals:   08/29/22 0619 08/29/22 0620 08/29/22 0653  BP:  (!) 154/86 (!) 153/90  Pulse:  (!) 106 (!) 103  Resp:  20 20  Temp:  98.7 F (37.1 C)   TempSrc:  Oral   SpO2:  96% 97%  Weight: 79.4 kg    Height: '5\' 11"'$  (1.803 m)     Physical Exam Vitals and nursing note reviewed.  Constitutional:      General: He is awake. He is not in acute distress.    Appearance: He is normal weight.  HENT:     Head: Normocephalic.     Nose: No rhinorrhea.     Mouth/Throat:     Mouth: Mucous membranes are moist.     Pharynx: No posterior oropharyngeal erythema.  Eyes:     General: Scleral icterus present.     Pupils: Pupils are equal, round, and reactive to light.  Neck:     Vascular: No JVD.  Cardiovascular:     Rate and Rhythm: Normal rate. Rhythm irregular.     Heart sounds: S1 normal and S2 normal.   Pulmonary:     Effort: Pulmonary effort is normal.     Breath sounds: No wheezing, rhonchi or rales.  Abdominal:     General: Bowel sounds are normal. There is no distension.     Palpations: Abdomen is soft.     Tenderness: There is no abdominal tenderness.  Musculoskeletal:     Cervical back: Neck supple.     Right lower leg: No edema.     Left lower leg: No edema.  Skin:    General: Skin is warm and dry.     Findings: Bruising present.  Neurological:     General: No focal deficit present.     Mental Status: He is alert and oriented to person, place, and time.  Psychiatric:        Mood and Affect: Mood normal.        Behavior: Behavior normal. Behavior is cooperative.   Data Reviewed:  Results are pending, will review when available.  EKG: Vent. rate 108 BPM PR interval * ms QRS duration 82 ms QT/QTcB 369/495 ms P-R-T axes * 35 73 Atrial fibrillation Borderline low voltage, extremity leads Minimal ST depression, anterolateral leads Borderline prolonged QT interval  Assessment and Plan: Principal Problem:   Alcohol abuse Presenting with:   Alcohol withdrawal seizure ([place]) CIWA protocol with lorazepam. Magnesium sulfate supplementation. Folate, MVI and thiamine. Consult transition of care team. Alcohol cessation advised. Continue Keppra 500 mg p.o. twice daily.  Active Problems:   Hyperlipidemia Hold atorvastatin for now given abnormal LFTs.    Essential hypertension Continue amlodipine 5 mg p.o. daily. Increase carvedilol to 25 mg p.o. twice daily Discontinue metoprolol 100 mg QD as it is redundant. Continue lisinopril 40 mg p.o. daily.    CAD (coronary artery disease) Continue amlodipine, carvedilol and clopidogrel.   Chronic atrial fibrillation (HCC) Continue apixaban 5 mg p.o. twice daily. Told about high risk of bleeding given liver cirrhosis. Advised to cease alcohol consumption.    COPD (chronic obstructive pulmonary disease)  (HCC) Supplemental oxygen and bronchodilators as needed.    Barrett's esophagus   Gastroesophageal reflux disease without esophagitis Begin pantoprazole 40 mg p.o. daily.    Gout Continue allopurinol, 100 mg p.o. daily.     Advance Care Planning:   Code Status: Full Code   Consults:   Family Communication:   Severity of  Illness: The appropriate patient status for this patient is INPATIENT. Inpatient status is judged to be reasonable and necessary in order to provide the required intensity of service to ensure the patient's safety. The patient's presenting symptoms, physical exam findings, and initial radiographic and laboratory data in the context of their chronic comorbidities is felt to place them at high risk for further clinical deterioration. Furthermore, it is not anticipated that the patient will be medically stable for discharge from the hospital within 2 midnights of admission.   * I certify that at the point of admission it is my clinical judgment that the patient will require inpatient hospital care spanning beyond 2 midnights from the point of admission due to high intensity of service, high risk for further deterioration and high frequency of surveillance required.*  Author: Reubin Milan, MD 08/29/2022 8:29 AM  For on call review [email or web address].   This document was prepared using Dragon voice recognition software and may contain some unintended transcription errors.

## 2022-08-29 NOTE — ED Triage Notes (Signed)
Arrives EMS from home after wife awoke from sleep describing seizure like activity by pt. Upon paramedic arrival pt appeared postictal.   No hx of seizures.   Recent admission for subdural hemotoma.   Hx afib and takes Eliquis,  CHF. COPD, and alcohol abuse.

## 2022-08-30 LAB — MAGNESIUM: Magnesium: 1.6 mg/dL — ABNORMAL LOW (ref 1.7–2.4)

## 2022-08-30 LAB — COMPREHENSIVE METABOLIC PANEL
ALT: 21 U/L (ref 0–44)
AST: 44 U/L — ABNORMAL HIGH (ref 15–41)
Albumin: 2.6 g/dL — ABNORMAL LOW (ref 3.5–5.0)
Alkaline Phosphatase: 96 U/L (ref 38–126)
Anion gap: 8 (ref 5–15)
BUN: 11 mg/dL (ref 8–23)
CO2: 25 mmol/L (ref 22–32)
Calcium: 8.1 mg/dL — ABNORMAL LOW (ref 8.9–10.3)
Chloride: 102 mmol/L (ref 98–111)
Creatinine, Ser: 0.74 mg/dL (ref 0.61–1.24)
GFR, Estimated: >60 mL/min (ref >60)
Glucose, Bld: 108 mg/dL — ABNORMAL HIGH (ref 70–99)
Potassium: 3.2 mmol/L — ABNORMAL LOW (ref 3.5–5.1)
Sodium: 135 mmol/L (ref 135–145)
Total Bilirubin: 5.4 mg/dL — ABNORMAL HIGH (ref 0.3–1.2)
Total Protein: 6.4 g/dL — ABNORMAL LOW (ref 6.5–8.1)

## 2022-08-30 LAB — URINE CULTURE: Culture: NO GROWTH

## 2022-08-30 LAB — TROPONIN I (HIGH SENSITIVITY): Troponin I (High Sensitivity): 18 ng/L — ABNORMAL HIGH (ref <18)

## 2022-08-30 LAB — CBC
HCT: 27.2 % — ABNORMAL LOW (ref 39.0–52.0)
Hemoglobin: 9.4 g/dL — ABNORMAL LOW (ref 13.0–17.0)
MCH: 38.2 pg — ABNORMAL HIGH (ref 26.0–34.0)
MCHC: 34.6 g/dL (ref 30.0–36.0)
MCV: 110.6 fL — ABNORMAL HIGH (ref 80.0–100.0)
Platelets: 79 10*3/uL — ABNORMAL LOW (ref 150–400)
RBC: 2.46 MIL/uL — ABNORMAL LOW (ref 4.22–5.81)
RDW: 15.9 % — ABNORMAL HIGH (ref 11.5–15.5)
WBC: 5.2 10*3/uL (ref 4.0–10.5)
nRBC: 0 % (ref 0.0–0.2)

## 2022-08-30 MED ORDER — MAGNESIUM SULFATE 2 GM/50ML IV SOLN
2.0000 g | Freq: Once | INTRAVENOUS | Status: AC
  Administered 2022-08-30: 2 g via INTRAVENOUS
  Filled 2022-08-30: qty 50

## 2022-08-30 MED ORDER — POTASSIUM CHLORIDE CRYS ER 20 MEQ PO TBCR
40.0000 meq | EXTENDED_RELEASE_TABLET | Freq: Once | ORAL | Status: AC
  Administered 2022-08-30: 40 meq via ORAL
  Filled 2022-08-30: qty 2

## 2022-08-30 NOTE — ED Notes (Signed)
Patient updated on room status - lunch tray at bedside. Pt voices no needs.

## 2022-08-30 NOTE — Progress Notes (Signed)
Progress Note   Patient: Kenneth Stevenson OXB:[id number] DOB: 1946-05-07 DOA: 08/29/2022     1 DOS: the patient was seen and examined on 08/30/2022   Brief hospital course:   77 y.o. male with medical history significant of unspecified CHF, hypertension, CAD, history of triple-vessel CABG, chronic atrial fibrillation on apixaban, hypertension, syncope and collapse, gout, polymyalgia rheumatica, rotator cuff tear, pathological fracture of the hip, subdural hemorrhage 3 months ago, osteoarthritis, spondylolysis of the lumbar region, senile nuclear sclerosis, hepatitis A, alcoholic liver cirrhosis, alcohol withdrawal seizures, alcohol dependence who has been trying to decrease his alcohol consumption since around Christmas and is only drinking 2 beers a day now and no longer drinking liquor according to the patient.  He was sleeping and his wife awoke next to him and noticed send he was having seizure like activity.  When EMS arrived they describe the patient and appearing postictal. He denied fever, chills, rhinorrhea, sore throat, wheezing or hemoptysis.  He has a hide occasional mild epistaxis.  Recent episodes of palpitations, but no chest pain, diaphoresis, PND, orthopnea or pitting edema of the lower extremities.  No abdominal pain, nausea, emesis, diarrhea, constipation, melena or hematochezia.  No flank pain, dysuria, frequency or hematuria.  No polyuria, polydipsia, polyphagia or blurred vision.    ED course: Initial vital signs were temperature 98.7 F, pulse 76, respiration 20, BP 154/86 mmHg O2 sat 96% on room air.  The patient received magnesium sulfate 2 g IVPB, lorazepam 1 mg IVPB, lactulose 20 g p.o. x 1, NS 1500 mL bolus and 100 mg of thiamine.  I ordered oxycodone 5 mg p.o. x 1 for glossal trauma.   Labwork: CBC showed a white count 6.4, hemoglobin 11.0 g/dL platelets 101.  PT 22.7, INR 2.0 and PTT 66.  Ammonia level was 91 mol/L.  Lactic acid 6.6 and then 2.0 mmol/L.  Magnesium was 1.5 and  phosphorus 2.1 mg/dL.  Normal troponin, lipase and alcohol level.  Negative coronavirus, influenza and RSV PCR.  CMP showed sodium 133 and CO2 of 21 mmol/L with a normal anion gap.  Normal potassium, chloride and renal function.  Total bilirubin 4.8 mg/dL.  Albumin was 3.0 g/dL and AST 66 units/L.  The rest of the LFTs and calcium were normal after correcting to albumin level.   Imaging: Portable 1 view chest radiograph with low volume with vascular congestion interstitial edema.  Patchy airspace disease at the bases may be dependent edema or pneumonia.  CT head without contrast showed an aging brain without acute or focal finding.   Assessment and Plan: Principal Problem:    Alcohol abuse /Alcohol withdrawal seizure :   CIWA protocol with lorazepam. Magnesium sulfate supplementation. Folate, MVI and thiamine. Consult transition of care team. Alcohol cessation advised. Continue Keppra 500 mg p.o. twice daily.   Hyperlipidemia Hold atorvastatin for now given abnormal LFTs.   Essential hypertension Continue amlodipine 5 mg p.o. daily. Increase carvedilol to 25 mg p.o. twice daily Discontinue metoprolol 100 mg QD as it is redundant. Continue lisinopril 40 mg p.o. daily.   CAD (coronary artery disease) Continue amlodipine, carvedilol and clopidogrel.   Chronic atrial fibrillation (HCC) Continue apixaban 5 mg p.o. twice daily. Told about high risk of bleeding given liver cirrhosis. Advised to cease alcohol consumption.   COPD (chronic obstructive pulmonary disease) (HCC) Supplemental oxygen and bronchodilators as needed.    Barrett's esophagus  Gastroesophageal reflux disease without esophagitis Begin pantoprazole 40 mg p.o. daily.    Gout Continue allopurinol, 100 mg  p.o. daily.    Advance Care Planning:   Code Status: Full Code     Subjective: Patient seen and examined this morning.  Vital labs and imaging reviewed.  Patient with CIWA score of 1.  No seizure episode  overnight in the last 24 hours.  No symptoms of alcohol withdrawal.   Physical Exam: Vitals:   08/30/22 1200 08/30/22 1315 08/30/22 1439 08/30/22 1611  BP: 105/64 91/66 118/89 116/89  Pulse: 85 95 87 88  Resp: '15 20 18 18  '$ Temp:  97.6 F (36.4 C) 98.1 F (36.7 C) 98 F (36.7 C)  TempSrc:  Oral  Oral  SpO2: 96% 98% 98% 98%  Weight:      Height:        Data Reviewed: {Tip this will not be part of the note when signed- Document your independent interpretation of telemetry tracing, EKG, lab, Radiology test or any other diagnostic tests. Add any new diagnostic test ordered today.   Family Communication: None by bedside  Disposition: Status is: Inpatient Remains inpatient appropriate because: Seizure episode and Alcohol withdrawal.   Planned Discharge Destination: Home    Time spent: 35  minutes  Author: Oran Rein, MD 08/30/2022 4:26 PM  For on call review [email or web address].

## 2022-08-31 LAB — CBC
HCT: 26.2 % — ABNORMAL LOW (ref 39.0–52.0)
Hemoglobin: 9.3 g/dL — ABNORMAL LOW (ref 13.0–17.0)
MCH: 38.9 pg — ABNORMAL HIGH (ref 26.0–34.0)
MCHC: 35.5 g/dL (ref 30.0–36.0)
MCV: 109.6 fL — ABNORMAL HIGH (ref 80.0–100.0)
Platelets: 81 10*3/uL — ABNORMAL LOW (ref 150–400)
RBC: 2.39 MIL/uL — ABNORMAL LOW (ref 4.22–5.81)
RDW: 15.7 % — ABNORMAL HIGH (ref 11.5–15.5)
WBC: 5.4 10*3/uL (ref 4.0–10.5)
nRBC: 0 % (ref 0.0–0.2)

## 2022-08-31 LAB — COMPREHENSIVE METABOLIC PANEL
ALT: 20 U/L (ref 0–44)
AST: 37 U/L (ref 15–41)
Albumin: 2.5 g/dL — ABNORMAL LOW (ref 3.5–5.0)
Alkaline Phosphatase: 93 U/L (ref 38–126)
Anion gap: 6 (ref 5–15)
BUN: 12 mg/dL (ref 8–23)
CO2: 25 mmol/L (ref 22–32)
Calcium: 8.2 mg/dL — ABNORMAL LOW (ref 8.9–10.3)
Chloride: 102 mmol/L (ref 98–111)
Creatinine, Ser: 0.9 mg/dL (ref 0.61–1.24)
GFR, Estimated: >60 mL/min (ref >60)
Glucose, Bld: 113 mg/dL — ABNORMAL HIGH (ref 70–99)
Potassium: 3.2 mmol/L — ABNORMAL LOW (ref 3.5–5.1)
Sodium: 133 mmol/L — ABNORMAL LOW (ref 135–145)
Total Bilirubin: 4.2 mg/dL — ABNORMAL HIGH (ref 0.3–1.2)
Total Protein: 6.2 g/dL — ABNORMAL LOW (ref 6.5–8.1)

## 2022-08-31 MED ORDER — LEVETIRACETAM 500 MG PO TABS: 500.0000 mg | ORAL_TABLET | Freq: Two times a day (BID) | ORAL | 0 refills | Status: DC

## 2022-08-31 MED ORDER — POLYVINYL ALCOHOL 1.4 % OP SOLN
1.0000 [drp] | OPHTHALMIC | Status: DC | PRN
  Filled 2022-08-31: qty 15

## 2022-08-31 MED ORDER — POTASSIUM CHLORIDE CRYS ER 20 MEQ PO TBCR
40.0000 meq | EXTENDED_RELEASE_TABLET | ORAL | Status: AC
  Administered 2022-08-31 (×3): 40 meq via ORAL
  Filled 2022-08-31 (×2): qty 2

## 2022-08-31 NOTE — Discharge Summary (Addendum)
Physician Discharge Summary   Patient: Kenneth Stevenson MRN: [id number] DOB: Jan 02, 1946  Admit date:     08/29/2022  Discharge date: 08/31/22  Discharge Physician: Oran Rein   PCP: [facility]   Recommendations at discharge:    Follow up with PCP in 2 weeks  Discharge Diagnoses: Principal Problem:   Alcohol withdrawal seizure ([place]) Active Problems:   Hyperlipidemia   Essential hypertension   Alcohol abuse   CAD (coronary artery disease)   Barrett's esophagus   Chronic atrial fibrillation (HCC)   COPD (chronic obstructive pulmonary disease) (HCC)   Gastroesophageal reflux disease without esophagitis   Gout  Resolved Problems:   * No resolved hospital problems. *  Hospital Course:  77 y.o. male with medical history significant of unspecified CHF, hypertension, CAD, history of triple-vessel CABG, chronic atrial fibrillation on apixaban, hypertension, syncope and collapse, gout, polymyalgia rheumatica, rotator cuff tear, pathological fracture of the hip, subdural hemorrhage 3 months ago, osteoarthritis, spondylolysis of the lumbar region, senile nuclear sclerosis, hepatitis A, alcoholic liver cirrhosis, alcohol withdrawal seizures, alcohol dependence who has been trying to decrease his alcohol consumption since around Christmas and is only drinking 2 beers a day now and no longer drinking liquor according to the patient.  He was sleeping and his wife awoke next to him and noticed send he was having seizure like activity.  When EMS arrived they describe the patient and appearing postictal. He denied fever, chills, rhinorrhea, sore throat, wheezing or hemoptysis.  He has a hide occasional mild epistaxis.  Recent episodes of palpitations, but no chest pain, diaphoresis, PND, orthopnea or pitting edema of the lower extremities.  No abdominal pain, nausea, emesis, diarrhea, constipation, melena or hematochezia.  No flank pain, dysuria, frequency or hematuria.  No polyuria,  polydipsia, polyphagia or blurred vision.    ED course: Initial vital signs were temperature 98.7 F, pulse 76, respiration 20, BP 154/86 mmHg O2 sat 96% on room air.  The patient received magnesium sulfate 2 g IVPB, lorazepam 1 mg IVPB, lactulose 20 g p.o. x 1, NS 1500 mL bolus and 100 mg of thiamine.  I ordered oxycodone 5 mg p.o. x 1 for glossal trauma.   Labwork: CBC showed a white count 6.4, hemoglobin 11.0 g/dL platelets 101.  PT 22.7, INR 2.0 and PTT 66.  Ammonia level was 91 mol/L.  Lactic acid 6.6 and then 2.0 mmol/L.  Magnesium was 1.5 and phosphorus 2.1 mg/dL.  Normal troponin, lipase and alcohol level.  Negative coronavirus, influenza and RSV PCR.  CMP showed sodium 133 and CO2 of 21 mmol/L with a normal anion gap.  Normal potassium, chloride and renal function.  Total bilirubin 4.8 mg/dL.  Albumin was 3.0 g/dL and AST 66 units/L.  The rest of the LFTs and calcium were normal after correcting to albumin level.   Imaging: Portable 1 view chest radiograph with low volume with vascular congestion interstitial edema.  Patchy airspace disease at the bases may be dependent edema or pneumonia.  CT head without contrast showed an aging brain without acute or focal finding.  Patient was managed for alcohol withdrawal seizure with Keppra 5 mg twice daily.  Was placed on CIWA protocol with Ativan patient CIWA scores.  While under 8 throughout the hospital course.  Had no seizure-like activity during his admission in the hospital.  Electrolytes were repleted appropriately his other comorbid conditions were managed with his home medications.  Patient on the day of discharge was medically stable.  Labs were in  reference range.  Patient discharged to home with follow-up with PCP in 2 to 4 weeks.   Assessment and Plan:  Alcohol abuse /Alcohol withdrawal seizure :    CIWA protocol with lorazepam. Magnesium sulfate supplementation. Folate, MVI and thiamine. Consult transition of care team. Alcohol  cessation advised. Continue Keppra 500 mg p.o. twice daily.   Hyperlipidemia Hold atorvastatin for now given abnormal LFTs.   Essential hypertension Continue amlodipine 5 mg p.o. daily. Increase carvedilol to 25 mg p.o. twice daily Discontinue metoprolol 100 mg QD as it is redundant. Continue lisinopril 40 mg p.o. daily.   CAD (coronary artery disease) Continue amlodipine, carvedilol and clopidogrel.  Chronic atrial fibrillation  Continue apixaban 5 mg p.o. twice daily. Told about high risk of bleeding given liver cirrhosis. Advised to cease alcohol consumption.   COPD (chronic obstructive pulmonary disease)  Supplemental oxygen and bronchodilators as needed.   Barrett's esophagus /Gastroesophageal reflux disease without esophagitis Begin pantoprazole 40 mg p.o. daily.   Gout Continue allopurinol, 100 mg p.o. daily.    Advance Care Planning:   Code Status: Full Code        Pain control - [place] Controlled Substance Reporting System database was reviewed. and patient was instructed, not to drive, operate heavy machinery, perform activities at heights, swimming or participation in water activities or provide baby-sitting services while on Pain, Sleep and Anxiety Medications; until their outpatient Physician has advised to do so again. Also recommended to not to take more than prescribed Pain, Sleep and Anxiety Medications.  Consultants: None   Disposition: Home Diet recommendation:  Discharge Diet Orders (From admission, onward)     Start     Ordered   08/31/22 0000  Diet - low sodium heart healthy        08/31/22 1335           Regular diet DISCHARGE MEDICATION: Allergies as of 08/31/2022       Reactions   Simvastatin    Empagliflozin Other (See Comments)   Other reaction(s): Tremor, Disorientated, Increased frequency of urination        Medication List     STOP taking these medications    HYDROcodone-acetaminophen 5-325 MG tablet Commonly  known as: NORCO/VICODIN   naltrexone 50 MG tablet Commonly known as: DEPADE   predniSONE 20 MG tablet Commonly known as: Deltasone       TAKE these medications    albuterol 108 (90 Base) MCG/ACT inhaler Commonly known as: VENTOLIN HFA Inhale 1 puff into the lungs every 6 (six) hours as needed for wheezing or shortness of breath.   alendronate 70 MG tablet Commonly known as: FOSAMAX TAKE ONE TABLET BY MOUTH ONCE WEEKLY TAKE FIRST THING IN MORNING WITH 8 OUNCES OF WATER 30 MINUTES BEFORE FOOD OR DRINK.REMAIN UPRIGHT FOR 30 MINUTES.   allopurinol 100 MG tablet Commonly known as: ZYLOPRIM Take 100 mg by mouth daily.   amLODipine 5 MG tablet Commonly known as: NORVASC Take 5 mg by mouth daily.   aspirin 81 MG tablet Take 81 mg by mouth daily.   atorvastatin 80 MG tablet Commonly known as: LIPITOR Take 80 mg by mouth daily.   carvedilol 12.5 MG tablet Commonly known as: COREG Take 12.5 mg by mouth 2 (two) times daily with a meal.   cetirizine 10 MG tablet Commonly known as: ZYRTEC TAKE ONE TABLET BY MOUTH EVERY DAY AS NEEDED FOR ITCHINESS   clopidogrel 75 MG tablet Commonly known as: PLAVIX Take 75 mg by mouth daily.  colchicine 0.6 MG tablet 2 tablets stat, then 1 tablet tid until relief or stomach upset   cyanocobalamin 1000 MCG tablet Commonly known as: VITAMIN B12 Take 1,000 mcg by mouth daily.   cyanocobalamin 100 MCG tablet Take 1 tablet by mouth daily.   diclofenac Sodium 1 % Gel Commonly known as: VOLTAREN APPLY 4 GRAMS TOPICALLY EVERY 6 HOURS FOR BACK PAIN   Eliquis 5 MG Tabs tablet Generic drug: apixaban Take 5 mg by mouth 2 (two) times daily.   levETIRAcetam 500 MG tablet Commonly known as: Keppra Take 1 tablet (500 mg total) by mouth 2 (two) times daily.   lisinopril 40 MG tablet Commonly known as: ZESTRIL Take 40 mg by mouth daily.   magnesium (amino acid chelate) 133 MG tablet Take 1 tablet by mouth 2 (two) times daily.    metoprolol 200 MG 24 hr tablet Commonly known as: TOPROL-XL Take 200 mg by mouth daily.   ondansetron 4 MG disintegrating tablet Commonly known as: ZOFRAN-ODT Take 1 tablet (4 mg total) by mouth every 8 (eight) hours as needed for nausea or vomiting.   prednisoLONE acetate 1 % ophthalmic suspension Commonly known as: PRED FORTE INSTILL 1 DROP IN RIGHT EYE FOUR TIMES A DAY   pyridOXINE 100 MG tablet Commonly known as: VITAMIN B6 Take 100 mg by mouth daily.   Stiolto Respimat 2.5-2.5 MCG/ACT Aers Generic drug: Tiotropium Bromide-Olodaterol Inhale 2 each into the lungs daily.   triamcinolone ointment 0.5 % Commonly known as: KENALOG Apply 1 application topically 2 (two) times daily.        Discharge Exam: Filed Weights   08/29/22 0619  Weight: 79.4 kg  Head is atraumatic normocephalic. CVS S1-S2 regular with no grade 3 murmur, lungs equal entry bilaterally with no rhonchi or rales, neuro no focal deficits facial symmetry preserved, cranial nerves II through XII intact.  No pitting edema.  Patient AOx3.  Abdomen nontender nondistended.  Bowel sounds audible in all quadrants.  Condition at discharge: good  The results of significant diagnostics from this hospitalization (including imaging, microbiology, ancillary and laboratory) are listed below for reference.   Imaging Studies: DG Chest Port 1 View  Result Date: 08/29/2022 CLINICAL DATA:  Possible sepsis . EXAM: PORTABLE CHEST 1 VIEW COMPARISON:  06/17/2015 FINDINGS: 0645 hours. Low volume film. The cardio pericardial silhouette is enlarged. There is pulmonary vascular congestion without overt pulmonary edema. Diffuse interstitial opacity suggest pulmonary edema. Patchy airspace disease towards the bases may be dependent edema or pneumonia. Degenerative changes noted in the right shoulder. Telemetry leads overlie the chest. IMPRESSION: 1. Low volume film with vascular congestion and interstitial edema. 2. Patchy airspace  disease towards the bases may be dependent edema or pneumonia. Electronically Signed   By: Misty Stanley M.D.   On: 08/29/2022 06:57   CT Head Wo Contrast  Result Date: 08/29/2022 CLINICAL DATA:  Seizure, new onset, no history of trauma EXAM: CT HEAD WITHOUT CONTRAST TECHNIQUE: Contiguous axial images were obtained from the base of the skull through the vertex without intravenous contrast. RADIATION DOSE REDUCTION: This exam was performed according to the departmental dose-optimization program which includes automated exposure control, adjustment of the mA and/or kV according to patient size and/or use of iterative reconstruction technique. COMPARISON:  Head CT 05/30/2022 FINDINGS: Brain: No evidence of acute infarction, hemorrhage, hydrocephalus, extra-axial collection or mass lesion/mass effect. Generalized brain atrophy Vascular: No hyperdense vessel or unexpected calcification. Skull: Normal. Negative for fracture or focal lesion. Sinuses/Orbits: No acute finding IMPRESSION: Aging  brain without acute or focal finding. Electronically Signed   By: Jorje Guild M.D.   On: 08/29/2022 06:51    Microbiology: Results for orders placed or performed during the hospital encounter of 08/29/22  Resp panel by RT-PCR (RSV, Flu A&B, Covid) Anterior Nasal Swab     Status: None   Collection Time: 08/29/22  6:35 AM   Specimen: Anterior Nasal Swab  Result Value Ref Range Status   SARS Coronavirus 2 by RT PCR NEGATIVE NEGATIVE Final    Comment: (NOTE) SARS-CoV-2 target nucleic acids are NOT DETECTED.  The SARS-CoV-2 RNA is generally detectable in upper respiratory specimens during the acute phase of infection. The lowest concentration of SARS-CoV-2 viral copies this assay can detect is 138 copies/mL. A negative result does not preclude SARS-Cov-2 infection and should not be used as the sole basis for treatment or other patient management decisions. A negative result may occur with  improper specimen  collection/handling, submission of specimen other than nasopharyngeal swab, presence of viral mutation(s) within the areas targeted by this assay, and inadequate number of viral copies(<138 copies/mL). A negative result must be combined with clinical observations, patient history, and epidemiological information. The expected result is Negative.  Fact Sheet for Patients:  EntrepreneurPulse.com.au  Fact Sheet for Healthcare Providers:  IncredibleEmployment.be  This test is no t yet approved or cleared by the [place] FDA and  has been authorized for detection and/or diagnosis of SARS-CoV-2 by FDA under an Emergency Use Authorization (EUA). This EUA will remain  in effect (meaning this test can be used) for the duration of the COVID-19 declaration under Section 564(b)(1) of the Act, 21 U.S.C.section 360bbb-3(b)(1), unless the authorization is terminated  or revoked sooner.       Influenza A by PCR NEGATIVE NEGATIVE Final   Influenza B by PCR NEGATIVE NEGATIVE Final    Comment: (NOTE) The Xpert Xpress SARS-CoV-2/FLU/RSV plus assay is intended as an aid in the diagnosis of influenza from Nasopharyngeal swab specimens and should not be used as a sole basis for treatment. Nasal washings and aspirates are unacceptable for Xpert Xpress SARS-CoV-2/FLU/RSV testing.  Fact Sheet for Patients: EntrepreneurPulse.com.au  Fact Sheet for Healthcare Providers: IncredibleEmployment.be  This test is not yet approved or cleared by the [place] FDA and has been authorized for detection and/or diagnosis of SARS-CoV-2 by FDA under an Emergency Use Authorization (EUA). This EUA will remain in effect (meaning this test can be used) for the duration of the COVID-19 declaration under Section 564(b)(1) of the Act, 21 U.S.C. section 360bbb-3(b)(1), unless the authorization is terminated or revoked.     Resp Syncytial  Virus by PCR NEGATIVE NEGATIVE Final    Comment: (NOTE) Fact Sheet for Patients: EntrepreneurPulse.com.au  Fact Sheet for Healthcare Providers: IncredibleEmployment.be  This test is not yet approved or cleared by the [place] FDA and has been authorized for detection and/or diagnosis of SARS-CoV-2 by FDA under an Emergency Use Authorization (EUA). This EUA will remain in effect (meaning this test can be used) for the duration of the COVID-19 declaration under Section 564(b)(1) of the Act, 21 U.S.C. section 360bbb-3(b)(1), unless the authorization is terminated or revoked.  Performed at [facility], [address] [place], [place] [address]   Blood culture (routine single)     Status: None (Preliminary result)   Collection Time: 08/29/22  7:37 AM   Specimen: BLOOD  Result Value Ref Range Status   Specimen Description BLOOD  LEFT [facility]  Final   Special  Requests   Final    BOTTLES DRAWN AEROBIC AND ANAEROBIC Blood Culture adequate volume   Culture   Final    NO GROWTH 2 DAYS Performed at [facility], [address] [place], [place] [address]    Report Status PENDING  Incomplete  Urine Culture     Status: None   Collection Time: 08/29/22  9:06 AM   Specimen: In/Out Cath Urine  Result Value Ref Range Status   Specimen Description   Final    IN/OUT CATH URINE Performed at [facility], [address], [place], [place] [address]    Special Requests   Final    NONE Performed at [facility], [address], [place], [place] [address]    Culture   Final    NO GROWTH Performed at [facility], [place] [address] [place], [place] [address]    Report Status 08/30/2022 FINAL  Final    Labs: CBC: Recent Labs  Lab 08/29/22 0620 08/30/22 0307 08/31/22 0509  WBC 6.4 5.2 5.4  NEUTROABS 4.4  --   --   HGB 11.0* 9.4* 9.3*  HCT 33.1* 27.2* 26.2*  MCV 111.4* 110.6* 109.6*  PLT 101* 79* 81*    Basic Metabolic Panel: Recent Labs  Lab 08/29/22 0620 08/30/22 0307 08/31/22 0509  NA 133* 135 133*  K 3.8 3.2* 3.2*  CL 100 102 102  CO2 21* 25 25  GLUCOSE 132* 108* 113*  BUN '10 11 12  '$ CREATININE 0.91 0.74 0.90  CALCIUM 8.8* 8.1* 8.2*  MG 1.5* 1.6*  --   PHOS 2.1*  --   --    Liver Function Tests: Recent Labs  Lab 08/29/22 0620 08/30/22 0307 08/31/22 0509  AST 66* 44* 37  ALT '28 21 20  '$ ALKPHOS 117 96 93  BILITOT 4.8* 5.4* 4.2*  PROT 7.5 6.4* 6.2*  ALBUMIN 3.0* 2.6* 2.5*   CBG: No results for input(s): "GLUCAP" in the last 168 hours.  Discharge time spent: greater than 30 minutes.  Signed: Oran Rein, MD [facility] 08/31/2022

## 2022-08-31 NOTE — Plan of Care (Signed)
  Problem: Health Behavior/Discharge Planning: Goal: Ability to manage health-related needs will improve Outcome: Progressing   

## 2022-08-31 NOTE — TOC Initial Note (Signed)
Transition of Care [facility]) - Initial/Assessment Note    Patient Details  Name: Kenneth Stevenson MRN: [id number] Date of Birth: 1946-06-26  Transition of [facility]) CM/SW Contact:    Elliot Gurney [place], [place] Phone Number: 08/31/2022, 1:50 PM  Clinical Narrative:             Substance abuse consult received. Met with patient at bedside to provide substance abuse resources. Patient denies need for substance abuse resources verbalizing plan to "cut down"on alcohol use.  Patient states that he is services connected and receives all of his medical care through the [place]. Patient states that if any additional substance abuse resources are needed, he will go through the [place]. Patient states that he has a cain and walker at home. Patient resides with his wife and daughter. Patient's spouse will transport patient home upon discharge. Patient verbalized having no additional needs at this time.  Eltha Tingley, LCSW Transition of Care   Expected Discharge Plan: Home/Self Care Barriers to Discharge: No Barriers Identified   Patient Goals and CMS Choice Patient states their goals for this hospitalization and ongoing recovery are:: "I plan to cut down on my drinking"          Expected Discharge Plan and Services In-house Referral: Clinical Social Work     Living arrangements for the past 2 months: Single Family Home Expected Discharge Date: 08/31/22                                    Prior Living Arrangements/Services Living arrangements for the past 2 months: Single Family Home Lives with:: Spouse   Do you feel safe going back to the place where you live?: Yes      Need for Family Participation in Patient Care: No (Comment) Care giver support system in place?: Yes (comment)   Criminal Activity/Legal Involvement Pertinent to Current Situation/Hospitalization: No - Comment as needed  Activities of Daily Living Home Assistive Devices/Equipment: Walker (specify type), Eyeglasses ADL  Screening (condition at time of admission) Patient's cognitive ability adequate to safely complete daily activities?: Yes Is the patient deaf or have difficulty hearing?: No Does the patient have difficulty seeing, even when wearing glasses/contacts?: No Does the patient have difficulty concentrating, remembering, or making decisions?: Yes Patient able to express need for assistance with ADLs?: Yes Does the patient have difficulty dressing or bathing?: No Independently performs ADLs?: Yes (appropriate for developmental age) Does the patient have difficulty walking or climbing stairs?: Yes Weakness of Legs: Both Weakness of Arms/Hands: None  Permission Sought/Granted                  Emotional Assessment Appearance:: Appears younger than stated age Attitude/Demeanor/Rapport: Engaged, Self-Confident Affect (typically observed): Accepting, Adaptable Orientation: : Oriented to Self, Oriented to Place, Oriented to  Time, Oriented to Situation Alcohol / Substance Use: Alcohol Use Psych Involvement: No (comment)  Admission diagnosis:  Confusion [R41.0] Hyperammonemia ([place]) [E72.20] Alcohol withdrawal seizure ([place]) [F10.939, R56.9] Chronic atrial fibrillation ([place]) [I48.20] Seizure-like activity ([place]) [R56.9] Current use of long term anticoagulation [Z79.01] History of liver failure [Z87.19] Patient Active Problem List   Diagnosis Date Noted   Alcohol withdrawal seizure ([place]) 08/29/2022   Alcohol abuse 07/24/2021   Barrett's esophagus 07/24/2021   Other and unspecified angina pectoris 07/24/2021   Complete rotator cuff tear or rupture of left shoulder, not specified as traumatic 07/24/2021   Cough, unspecified 07/24/2021   Dermatitis,  unspecified 07/24/2021   Difficulty in walking, not elsewhere classified 07/24/2021   Dyspnea, unspecified 07/24/2021   Fracture of unspecified part of neck of unspecified femur, initial encounter for closed fracture ([place]) 07/24/2021    Gastroesophageal reflux disease without esophagitis 07/24/2021   Gout, unspecified 07/24/2021   Gout 07/24/2021   Hepatitis A 07/24/2021   Hypo-osmolality and hyponatremia 07/24/2021   Impotence of organic origin 07/24/2021   Inflammation of joint of right shoulder region 07/24/2021   Low back pain, unspecified 07/24/2021   Need for assistance with personal care 07/24/2021   Neoplasm of uncertain behavior of skin 07/24/2021   Nummular dermatitis 07/24/2021   Occlusion and stenosis of right carotid artery 07/24/2021   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   Osteoarthritis of bilateral glenohumeral joints 07/24/2021   Other abnormalities of gait and mobility 07/24/2021   Other acute postprocedural pain 07/24/2021   Other ill-defined and unknown causes of morbidity and mortality 07/24/2021   Other osteonecrosis, unspecified femur ([place]) 07/24/2021   Other secondary cataract, right eye 07/24/2021   Other specified abnormal findings of blood chemistry 07/24/2021   Pathological fracture, hip, unspecified, initial encounter for fracture ([place]) 07/24/2021   Personal history of noncompliance with medical treatment, presenting hazards to health 07/24/2021   Polymyalgia rheumatica ([place]) 07/24/2021   Pain in left hip 07/24/2021   Problem related to unspecified psychosocial circumstances 07/24/2021   Psychological and behavioral factors associated with disorders or diseases classified elsewhere 07/24/2021   Rotator cuff tear arthropathy 07/24/2021   Senile nuclear sclerosis 07/24/2021   Spondylosis without myelopathy or radiculopathy, lumbar region 07/24/2021   Syncope and collapse 07/24/2021   Undiagnosed cardiac murmurs 07/24/2021   Unilateral primary osteoarthritis, left hip 07/24/2021   CAD (coronary artery disease) 07/31/2020   Chronic atrial fibrillation ([place]) 07/31/2020   COPD (chronic obstructive pulmonary disease) ([place]) 07/31/2020   Hyperlipidemia 02/12/2010   Essential hypertension 02/12/2010    PCP:  [place]:   [id number] [place] [place] [id number]: [phone]: [phone]    Social Determinants of Health (SDOH) Social History: SDOH Screenings   Food Insecurity: No Food Insecurity (08/29/2022)  Housing: Low Risk  (08/29/2022)  Transportation Needs: No Transportation Needs (08/29/2022)  Utilities: Not At Risk (08/29/2022)  Tobacco Use: Low Risk  (08/29/2022)   SDOH Interventions:     Readmission Risk Interventions     No data to display

## 2022-09-03 LAB — CULTURE, BLOOD (SINGLE)
Culture: NO GROWTH
Special Requests: ADEQUATE

## 2022-11-06 DIAGNOSIS — R569 Unspecified convulsions: Secondary | ICD-10-CM

## 2022-11-17 ENCOUNTER — Ambulatory Visit: Payer: Self-pay | Admitting: Physician Assistant

## 2022-12-02 ENCOUNTER — Other Ambulatory Visit: Payer: Self-pay

## 2022-12-02 ENCOUNTER — Emergency Department: Payer: No Typology Code available for payment source

## 2022-12-02 ENCOUNTER — Inpatient Hospital Stay
Admission: EM | Admit: 2022-12-02 | Discharge: 2022-12-03 | DRG: 433 | Disposition: A | Discharge status: Home / Self Care | Payer: No Typology Code available for payment source | Attending: Internal Medicine | Admitting: Internal Medicine

## 2022-12-02 DIAGNOSIS — I482 Chronic atrial fibrillation, unspecified: Secondary | ICD-10-CM | POA: Diagnosis present

## 2022-12-02 DIAGNOSIS — R040 Epistaxis: Secondary | ICD-10-CM

## 2022-12-02 DIAGNOSIS — Z515 Encounter for palliative care: Secondary | ICD-10-CM | POA: Diagnosis not present

## 2022-12-02 DIAGNOSIS — M810 Age-related osteoporosis without current pathological fracture: Secondary | ICD-10-CM | POA: Diagnosis present

## 2022-12-02 DIAGNOSIS — K2270 Barrett's esophagus without dysplasia: Secondary | ICD-10-CM | POA: Diagnosis present

## 2022-12-02 DIAGNOSIS — D539 Nutritional anemia, unspecified: Secondary | ICD-10-CM | POA: Diagnosis present

## 2022-12-02 DIAGNOSIS — K7030 Alcoholic cirrhosis of liver without ascites: Secondary | ICD-10-CM | POA: Diagnosis present

## 2022-12-02 DIAGNOSIS — E871 Hypo-osmolality and hyponatremia: Secondary | ICD-10-CM | POA: Diagnosis present

## 2022-12-02 DIAGNOSIS — K922 Gastrointestinal hemorrhage, unspecified: Secondary | ICD-10-CM | POA: Diagnosis not present

## 2022-12-02 DIAGNOSIS — D6832 Hemorrhagic disorder due to extrinsic circulating anticoagulants: Secondary | ICD-10-CM | POA: Diagnosis present

## 2022-12-02 DIAGNOSIS — F1010 Alcohol abuse, uncomplicated: Secondary | ICD-10-CM | POA: Diagnosis not present

## 2022-12-02 DIAGNOSIS — F1020 Alcohol dependence, uncomplicated: Secondary | ICD-10-CM | POA: Diagnosis present

## 2022-12-02 DIAGNOSIS — Z7982 Long term (current) use of aspirin: Secondary | ICD-10-CM

## 2022-12-02 DIAGNOSIS — Z7189 Other specified counseling: Secondary | ICD-10-CM | POA: Diagnosis not present

## 2022-12-02 DIAGNOSIS — Z955 Presence of coronary angioplasty implant and graft: Secondary | ICD-10-CM

## 2022-12-02 DIAGNOSIS — D696 Thrombocytopenia, unspecified: Secondary | ICD-10-CM | POA: Diagnosis present

## 2022-12-02 DIAGNOSIS — K7031 Alcoholic cirrhosis of liver with ascites: Secondary | ICD-10-CM

## 2022-12-02 DIAGNOSIS — K3189 Other diseases of stomach and duodenum: Secondary | ICD-10-CM | POA: Diagnosis present

## 2022-12-02 DIAGNOSIS — Z77090 Contact with and (suspected) exposure to asbestos: Secondary | ICD-10-CM | POA: Diagnosis present

## 2022-12-02 DIAGNOSIS — Z79899 Other long term (current) drug therapy: Secondary | ICD-10-CM

## 2022-12-02 DIAGNOSIS — I509 Heart failure, unspecified: Secondary | ICD-10-CM | POA: Diagnosis not present

## 2022-12-02 DIAGNOSIS — I10 Essential (primary) hypertension: Secondary | ICD-10-CM | POA: Diagnosis not present

## 2022-12-02 DIAGNOSIS — Z7901 Long term (current) use of anticoagulants: Secondary | ICD-10-CM | POA: Diagnosis not present

## 2022-12-02 DIAGNOSIS — J449 Chronic obstructive pulmonary disease, unspecified: Secondary | ICD-10-CM | POA: Diagnosis present

## 2022-12-02 DIAGNOSIS — I2510 Atherosclerotic heart disease of native coronary artery without angina pectoris: Secondary | ICD-10-CM | POA: Diagnosis present

## 2022-12-02 DIAGNOSIS — Z888 Allergy status to other drugs, medicaments and biological substances status: Secondary | ICD-10-CM

## 2022-12-02 DIAGNOSIS — K921 Melena: Secondary | ICD-10-CM | POA: Diagnosis not present

## 2022-12-02 DIAGNOSIS — E785 Hyperlipidemia, unspecified: Secondary | ICD-10-CM | POA: Diagnosis present

## 2022-12-02 DIAGNOSIS — I110 Hypertensive heart disease with heart failure: Secondary | ICD-10-CM | POA: Diagnosis present

## 2022-12-02 DIAGNOSIS — T45515A Adverse effect of anticoagulants, initial encounter: Secondary | ICD-10-CM | POA: Diagnosis present

## 2022-12-02 DIAGNOSIS — Z7983 Long term (current) use of bisphosphonates: Secondary | ICD-10-CM

## 2022-12-02 DIAGNOSIS — M7989 Other specified soft tissue disorders: Secondary | ICD-10-CM | POA: Diagnosis present

## 2022-12-02 DIAGNOSIS — E8809 Other disorders of plasma-protein metabolism, not elsewhere classified: Secondary | ICD-10-CM | POA: Diagnosis present

## 2022-12-02 DIAGNOSIS — M353 Polymyalgia rheumatica: Secondary | ICD-10-CM | POA: Diagnosis present

## 2022-12-02 DIAGNOSIS — Z951 Presence of aortocoronary bypass graft: Secondary | ICD-10-CM | POA: Diagnosis not present

## 2022-12-02 DIAGNOSIS — K766 Portal hypertension: Secondary | ICD-10-CM | POA: Diagnosis present

## 2022-12-02 DIAGNOSIS — Z8673 Personal history of transient ischemic attack (TIA), and cerebral infarction without residual deficits: Secondary | ICD-10-CM

## 2022-12-02 DIAGNOSIS — K219 Gastro-esophageal reflux disease without esophagitis: Secondary | ICD-10-CM | POA: Diagnosis present

## 2022-12-02 DIAGNOSIS — Z7902 Long term (current) use of antithrombotics/antiplatelets: Secondary | ICD-10-CM

## 2022-12-02 LAB — CBC
HCT: 27.9 % — ABNORMAL LOW (ref 39.0–52.0)
Hemoglobin: 9.2 g/dL — ABNORMAL LOW (ref 13.0–17.0)
MCH: 35.9 pg — ABNORMAL HIGH (ref 26.0–34.0)
MCHC: 33 g/dL (ref 30.0–36.0)
MCV: 109 fL — ABNORMAL HIGH (ref 80.0–100.0)
Platelets: 109 10*3/uL — ABNORMAL LOW (ref 150–400)
RBC: 2.56 MIL/uL — ABNORMAL LOW (ref 4.22–5.81)
RDW: 16.6 % — ABNORMAL HIGH (ref 11.5–15.5)
WBC: 5.1 10*3/uL (ref 4.0–10.5)
nRBC: 0 % (ref 0.0–0.2)

## 2022-12-02 LAB — HEPATIC FUNCTION PANEL
ALT: 18 U/L (ref 0–44)
AST: 38 U/L (ref 15–41)
Albumin: 2.7 g/dL — ABNORMAL LOW (ref 3.5–5.0)
Alkaline Phosphatase: 118 U/L (ref 38–126)
Bilirubin, Direct: 1.1 mg/dL — ABNORMAL HIGH (ref 0.0–0.2)
Indirect Bilirubin: 2.7 mg/dL — ABNORMAL HIGH (ref 0.3–0.9)
Total Bilirubin: 3.8 mg/dL — ABNORMAL HIGH (ref 0.3–1.2)
Total Protein: 6.6 g/dL (ref 6.5–8.1)

## 2022-12-02 LAB — TROPONIN I (HIGH SENSITIVITY)
Troponin I (High Sensitivity): 15 ng/L (ref <18)
Troponin I (High Sensitivity): 14 ng/L (ref <18)

## 2022-12-02 LAB — PROTIME-INR
INR: 2.5 — ABNORMAL HIGH (ref 0.8–1.2)
Prothrombin Time: 26.8 seconds — ABNORMAL HIGH (ref 11.4–15.2)

## 2022-12-02 LAB — TYPE AND SCREEN
ABO/RH(D): A POS
Antibody Screen: NEGATIVE

## 2022-12-02 LAB — BASIC METABOLIC PANEL
Anion gap: 6 (ref 5–15)
BUN: 9 mg/dL (ref 8–23)
CO2: 22 mmol/L (ref 22–32)
Calcium: 8.5 mg/dL — ABNORMAL LOW (ref 8.9–10.3)
Chloride: 100 mmol/L (ref 98–111)
Creatinine, Ser: 0.65 mg/dL (ref 0.61–1.24)
GFR, Estimated: >60 mL/min (ref >60)
Glucose, Bld: 149 mg/dL — ABNORMAL HIGH (ref 70–99)
Potassium: 3.8 mmol/L (ref 3.5–5.1)
Sodium: 128 mmol/L — ABNORMAL LOW (ref 135–145)

## 2022-12-02 LAB — AMMONIA: Ammonia: 23 umol/L (ref 9–35)

## 2022-12-02 LAB — BRAIN NATRIURETIC PEPTIDE: B Natriuretic Peptide: 556.1 pg/mL — ABNORMAL HIGH (ref 0.0–100.0)

## 2022-12-02 MED ORDER — ADULT MULTIVITAMIN W/MINERALS CH
1.0000 | ORAL_TABLET | Freq: Every day | ORAL | Status: DC
  Administered 2022-12-02: 1 via ORAL
  Filled 2022-12-02 (×2): qty 1

## 2022-12-02 MED ORDER — SODIUM CHLORIDE 0.9% FLUSH: 3.0000 mL | Freq: Two times a day (BID) | INTRAVENOUS | Status: DC

## 2022-12-02 MED ORDER — ONDANSETRON HCL 4 MG PO TABS: 4.0000 mg | ORAL_TABLET | Freq: Four times a day (QID) | ORAL | Status: DC | PRN

## 2022-12-02 MED ORDER — UMECLIDINIUM BROMIDE 62.5 MCG/ACT IN AEPB
1.0000 | INHALATION_SPRAY | Freq: Every day | RESPIRATORY_TRACT | Status: DC
  Administered 2022-12-03: 1 via RESPIRATORY_TRACT
  Filled 2022-12-02: qty 7

## 2022-12-02 MED ORDER — PANTOPRAZOLE SODIUM 40 MG IV SOLR
40.0000 mg | Freq: Two times a day (BID) | INTRAVENOUS | Status: DC
  Administered 2022-12-02 – 2022-12-03 (×2): 40 mg via INTRAVENOUS
  Filled 2022-12-02 (×2): qty 10

## 2022-12-02 MED ORDER — ACETAMINOPHEN 650 MG RE SUPP: 650.0000 mg | Freq: Four times a day (QID) | RECTAL | Status: DC | PRN

## 2022-12-02 MED ORDER — ARFORMOTEROL TARTRATE 15 MCG/2ML IN NEBU
15.0000 ug | INHALATION_SOLUTION | Freq: Two times a day (BID) | RESPIRATORY_TRACT | Status: DC
  Administered 2022-12-03: 15 ug via RESPIRATORY_TRACT
  Filled 2022-12-02 (×2): qty 2

## 2022-12-02 MED ORDER — FUROSEMIDE 10 MG/ML IJ SOLN
40.0000 mg | Freq: Every day | INTRAMUSCULAR | Status: DC
  Administered 2022-12-03: 40 mg via INTRAVENOUS
  Filled 2022-12-02: qty 4

## 2022-12-02 MED ORDER — LORAZEPAM 2 MG/ML IJ SOLN: 1.0000 mg | INTRAMUSCULAR | Status: DC | PRN

## 2022-12-02 MED ORDER — PHYTONADIONE 5 MG PO TABS
5.0000 mg | ORAL_TABLET | Freq: Once | ORAL | Status: AC
  Administered 2022-12-02: 5 mg via ORAL
  Filled 2022-12-02: qty 1

## 2022-12-02 MED ORDER — FOLIC ACID 1 MG PO TABS
1.0000 mg | ORAL_TABLET | Freq: Every day | ORAL | Status: DC
  Administered 2022-12-02: 1 mg via ORAL
  Filled 2022-12-02 (×2): qty 1

## 2022-12-02 MED ORDER — ALLOPURINOL 100 MG PO TABS
200.0000 mg | ORAL_TABLET | Freq: Every day | ORAL | Status: DC
  Filled 2022-12-02: qty 2

## 2022-12-02 MED ORDER — THIAMINE MONONITRATE 100 MG PO TABS
100.0000 mg | ORAL_TABLET | Freq: Every day | ORAL | Status: DC
  Administered 2022-12-02: 100 mg via ORAL
  Filled 2022-12-02 (×2): qty 1

## 2022-12-02 MED ORDER — SODIUM CHLORIDE 0.9 % IV SOLN
2.0000 g | Freq: Once | INTRAVENOUS | Status: AC
  Administered 2022-12-02: 2 g via INTRAVENOUS
  Filled 2022-12-02: qty 20

## 2022-12-02 MED ORDER — THIAMINE HCL 100 MG/ML IJ SOLN
100.0000 mg | Freq: Every day | INTRAMUSCULAR | Status: DC
  Filled 2022-12-02: qty 2

## 2022-12-02 MED ORDER — ALBUTEROL SULFATE (2.5 MG/3ML) 0.083% IN NEBU: 3.0000 mL | INHALATION_SOLUTION | Freq: Four times a day (QID) | RESPIRATORY_TRACT | Status: DC | PRN

## 2022-12-02 MED ORDER — SODIUM CHLORIDE 0.9 % IV SOLN: 1.0000 g | INTRAVENOUS | Status: DC

## 2022-12-02 MED ORDER — PANTOPRAZOLE SODIUM 40 MG IV SOLR: 40.0000 mg | Freq: Once | INTRAVENOUS | Status: DC

## 2022-12-02 MED ORDER — LORAZEPAM 1 MG PO TABS: 1.0000 mg | ORAL_TABLET | ORAL | Status: DC | PRN

## 2022-12-02 MED ORDER — ACETAMINOPHEN 325 MG PO TABS: 650.0000 mg | ORAL_TABLET | Freq: Four times a day (QID) | ORAL | Status: DC | PRN

## 2022-12-02 MED ORDER — CARVEDILOL 6.25 MG PO TABS
12.5000 mg | ORAL_TABLET | Freq: Two times a day (BID) | ORAL | Status: DC
  Filled 2022-12-02: qty 2

## 2022-12-02 MED ORDER — OCTREOTIDE LOAD VIA INFUSION
50.0000 ug | Freq: Once | INTRAVENOUS | Status: AC
  Administered 2022-12-02: 50 ug via INTRAVENOUS
  Filled 2022-12-02: qty 25

## 2022-12-02 MED ORDER — ONDANSETRON HCL 4 MG/2ML IJ SOLN: 4.0000 mg | Freq: Four times a day (QID) | INTRAMUSCULAR | Status: DC | PRN

## 2022-12-02 MED ORDER — SODIUM CHLORIDE 0.9 % IV SOLN
50.0000 ug/h | INTRAVENOUS | Status: DC
  Administered 2022-12-02 – 2022-12-03 (×2): 50 ug/h via INTRAVENOUS
  Filled 2022-12-02 (×4): qty 1

## 2022-12-02 MED ORDER — FUROSEMIDE 10 MG/ML IJ SOLN
40.0000 mg | Freq: Once | INTRAMUSCULAR | Status: AC
  Administered 2022-12-02: 40 mg via INTRAVENOUS
  Filled 2022-12-02: qty 4

## 2022-12-02 NOTE — Assessment & Plan Note (Addendum)
Patient reports one-time episode of melena today.  Likely secondary to epistaxis, however he is high risk given history of cirrhosis and portal gastropathy on anti-coagulation.  Hemoglobin stable at this time.  Last dose of Eliquis this morning.  - GI consulted; appreciate their recommendations - Clear liquid diet - N.p.o. at midnight - Continue octreotide infusion - Protonix 40 mg IV BID - Continue ceftriaxone for SBP prophylaxis - Hold home Eliquis - Daily CBC

## 2022-12-02 NOTE — Assessment & Plan Note (Signed)
-   Holding home Eliquis - Continue home beta-blocker

## 2022-12-02 NOTE — H&P (View-Only) (Signed)
  Inpatient Consultation   Patient ID: Kenneth Stevenson is a 76 y.o. male.  Requesting Provider: Mary Funke, MD (Emergency Medicine)  Date of Admission: 12/02/2022  Date of Consult: 12/02/22   Reason for Consultation: Melena, Cirrhosis   Patient's Chief Complaint:   Chief Complaint  Patient presents with   Epistaxis    76 y/o c compensated cirrhosis 2/2 etoh, EtOH dependence, A fib on OAC, CAD s/p CABG and DES, HTN, CHF, COPD, Barretts Esophagus, CVA who presents to the hospital with dark stool, weakness and epistaxis.  GI is consulted out of concern for upper GI bleeding and melena.  He is accompanied by his wife at bedside.  Patient reports that he has had epistaxis for several months, but increasing in frequency and severity especially over the last 2 days.  His hemoglobin is stable from January around 9.2.  Platelets are currently 109,000.  Vital signs are stable, in fact he is hypertensive.  He had 1 episode of dark stool this morning, but stools are otherwise brown.  He denies any hematochezia nausea vomiting abdominal pain.  He reports some lower extremity swelling but is not on any diuretics.  Denies any abdominal distention.  He does report shortness of breath.  He is seen his hepatologist as recently as late February early March.  He was transition from metoprolol to carvedilol 12 and half milligrams twice a day for improved coverage of portal hypertension.  He keeps many of his medications in the old bottles as he has a full bottle of omeprazole with a bottle marked filled in January 2023, but he reports he is actively taking this and just combines bottles.  His wife reports he sometimes has difficulty with directions, but overall mentation does okay.  He is not currently on lactulose or Xifaxan  No fevers chills dysphagia or odynophagia.  Last drink of alcohol was yesterday evening with 2 beers (12 ounces) and 1 shot of vodka.  Reports he took his eliquis this morning. Thinks  he is on plavix but this is not [facility] or [facility].  Denies NSAIDs, Anti-plt agents Denies family history of gastrointestinal disease and malignancy Previous Endoscopies: 08/2021- [facility] (08/2021)- possible PHG; no signs of varices or bleeding and Colonoscopy (09/2021)- normal   Past Medical History:  Diagnosis Date   CHF (congestive heart failure) (HCC)    Hypertension    S/P CABG x 3     No past surgical history on file.  Allergies  Allergen Reactions   Simvastatin    Empagliflozin Other (See Comments)    Other reaction(s): Tremor, Disorientated, Increased frequency of urination    No family history on file.  Social History   Tobacco Use   Smoking status: Never   Smokeless tobacco: Never  Substance Use Topics   Alcohol use: Yes   Drug use: No     Pertinent GI related history and allergies were reviewed with the patient  Review of Systems  Constitutional:  Positive for fatigue. Negative for activity change, appetite change, chills, diaphoresis, fever and unexpected weight change.  HENT:  Positive for nosebleeds. Negative for trouble swallowing and voice change.   Respiratory:  Positive for shortness of breath. Negative for wheezing.   Cardiovascular:  Positive for leg swelling. Negative for chest pain and palpitations.  Gastrointestinal:  Positive for blood in stool (dark stool, no hematochezia). Negative for abdominal distention, abdominal pain, anal bleeding, constipation, diarrhea, nausea and vomiting.  Musculoskeletal:    Negative for arthralgias and myalgias.  Skin:  Negative for color change and pallor.  Neurological:  Negative for dizziness, syncope and weakness.  Psychiatric/Behavioral:  Positive for confusion (reported by his wife). The patient is not nervous/anxious.   All other systems reviewed and are negative.    Medications Home Medications No current facility-administered medications on file prior to encounter.    Current Outpatient Medications on File Prior to Encounter  Medication Sig Dispense Refill   albuterol (PROVENTIL HFA;VENTOLIN HFA) 108 (90 Base) MCG/ACT inhaler Inhale 1 puff into the lungs every 6 (six) hours as needed for wheezing or shortness of breath.     alendronate (FOSAMAX) 70 MG tablet TAKE ONE TABLET BY MOUTH ONCE WEEKLY TAKE FIRST THING IN MORNING WITH 8 OUNCES OF WATER 30 MINUTES BEFORE FOOD OR DRINK.REMAIN UPRIGHT FOR 30 MINUTES.     allopurinol (ZYLOPRIM) 100 MG tablet Take 100 mg by mouth daily.     amLODipine (NORVASC) 5 MG tablet Take 5 mg by mouth daily.     apixaban (ELIQUIS) 5 MG TABS tablet Take 5 mg by mouth 2 (two) times daily.     aspirin 81 MG tablet Take 81 mg by mouth daily. (Patient not taking: Reported on 05/30/2022)     atorvastatin (LIPITOR) 80 MG tablet Take 80 mg by mouth daily.     carvedilol (COREG) 12.5 MG tablet Take 12.5 mg by mouth 2 (two) times daily with a meal.     cetirizine (ZYRTEC) 10 MG tablet TAKE ONE TABLET BY MOUTH EVERY DAY AS NEEDED FOR ITCHINESS     clopidogrel (PLAVIX) 75 MG tablet Take 75 mg by mouth daily.     colchicine 0.6 MG tablet 2 tablets stat, then 1 tablet tid until relief or stomach upset 30 tablet 0   cyanocobalamin 100 MCG tablet Take 1 tablet by mouth daily.     diclofenac Sodium (VOLTAREN) 1 % GEL APPLY 4 GRAMS TOPICALLY EVERY 6 HOURS FOR BACK PAIN     levETIRAcetam (KEPPRA) 500 MG tablet Take 1 tablet (500 mg total) by mouth 2 (two) times daily. 60 tablet 0   lisinopril (PRINIVIL,ZESTRIL) 40 MG tablet Take 40 mg by mouth daily.     metoprolol (TOPROL-XL) 200 MG 24 hr tablet Take 200 mg by mouth daily.     ondansetron (ZOFRAN-ODT) 4 MG disintegrating tablet Take 1 tablet (4 mg total) by mouth every 8 (eight) hours as needed for nausea or vomiting. 20 tablet 0   prednisoLONE acetate (PRED FORTE) 1 % ophthalmic suspension INSTILL 1 DROP IN RIGHT EYE FOUR TIMES A DAY (Patient not taking: Reported on 05/30/2022)     pyridOXINE  (VITAMIN B-6) 100 MG tablet Take 100 mg by mouth daily.     Specialty Vitamins Products (MAGNESIUM, AMINO ACID CHELATE,) 133 MG tablet Take 1 tablet by mouth 2 (two) times daily.     Tiotropium Bromide-Olodaterol (STIOLTO RESPIMAT) 2.5-2.5 MCG/ACT AERS Inhale 2 each into the lungs daily.     triamcinolone ointment (KENALOG) 0.5 % Apply 1 application topically 2 (two) times daily. 30 g 0   vitamin B-12 (CYANOCOBALAMIN) 1000 MCG tablet Take 1,000 mcg by mouth daily.     Pertinent GI related medications were reviewed with the patient  Inpatient Medications  Current Facility-Administered Medications:    cefTRIAXone (ROCEPHIN) 2 g in sodium chloride 0.9 % 100 mL IVPB, 2 g, Intravenous, Once, Funke, Mary E, MD   furosemide (LASIX) injection 40 mg, 40 mg, Intravenous, Once, Funke, Mary E, MD     octreotide (SANDOSTATIN) 2 mcg/mL load via infusion 50 mcg, 50 mcg, Intravenous, Once **AND** octreotide (SANDOSTATIN) 500 mcg in sodium chloride 0.9 % 250 mL (2 mcg/mL) infusion, 50 mcg/hr, Intravenous, Continuous, Funke, Mary E, MD   pantoprazole (PROTONIX) injection 40 mg, 40 mg, Intravenous, Once, Funke, Mary E, MD  Current Outpatient Medications:    albuterol (PROVENTIL HFA;VENTOLIN HFA) 108 (90 Base) MCG/ACT inhaler, Inhale 1 puff into the lungs every 6 (six) hours as needed for wheezing or shortness of breath., Disp: , Rfl:    alendronate (FOSAMAX) 70 MG tablet, TAKE ONE TABLET BY MOUTH ONCE WEEKLY TAKE FIRST THING IN MORNING WITH 8 OUNCES OF WATER 30 MINUTES BEFORE FOOD OR DRINK.REMAIN UPRIGHT FOR 30 MINUTES., Disp: , Rfl:    allopurinol (ZYLOPRIM) 100 MG tablet, Take 100 mg by mouth daily., Disp: , Rfl:    amLODipine (NORVASC) 5 MG tablet, Take 5 mg by mouth daily., Disp: , Rfl:    apixaban (ELIQUIS) 5 MG TABS tablet, Take 5 mg by mouth 2 (two) times daily., Disp: , Rfl:    aspirin 81 MG tablet, Take 81 mg by mouth daily. (Patient not taking: Reported on 05/30/2022), Disp: , Rfl:    atorvastatin  (LIPITOR) 80 MG tablet, Take 80 mg by mouth daily., Disp: , Rfl:    carvedilol (COREG) 12.5 MG tablet, Take 12.5 mg by mouth 2 (two) times daily with a meal., Disp: , Rfl:    cetirizine (ZYRTEC) 10 MG tablet, TAKE ONE TABLET BY MOUTH EVERY DAY AS NEEDED FOR ITCHINESS, Disp: , Rfl:    clopidogrel (PLAVIX) 75 MG tablet, Take 75 mg by mouth daily., Disp: , Rfl:    colchicine 0.6 MG tablet, 2 tablets stat, then 1 tablet tid until relief or stomach upset, Disp: 30 tablet, Rfl: 0   cyanocobalamin 100 MCG tablet, Take 1 tablet by mouth daily., Disp: , Rfl:    diclofenac Sodium (VOLTAREN) 1 % GEL, APPLY 4 GRAMS TOPICALLY EVERY 6 HOURS FOR BACK PAIN, Disp: , Rfl:    levETIRAcetam (KEPPRA) 500 MG tablet, Take 1 tablet (500 mg total) by mouth 2 (two) times daily., Disp: 60 tablet, Rfl: 0   lisinopril (PRINIVIL,ZESTRIL) 40 MG tablet, Take 40 mg by mouth daily., Disp: , Rfl:    metoprolol (TOPROL-XL) 200 MG 24 hr tablet, Take 200 mg by mouth daily., Disp: , Rfl:    ondansetron (ZOFRAN-ODT) 4 MG disintegrating tablet, Take 1 tablet (4 mg total) by mouth every 8 (eight) hours as needed for nausea or vomiting., Disp: 20 tablet, Rfl: 0   prednisoLONE acetate (PRED FORTE) 1 % ophthalmic suspension, INSTILL 1 DROP IN RIGHT EYE FOUR TIMES A DAY (Patient not taking: Reported on 05/30/2022), Disp: , Rfl:    pyridOXINE (VITAMIN B-6) 100 MG tablet, Take 100 mg by mouth daily., Disp: , Rfl:    Specialty Vitamins Products (MAGNESIUM, AMINO ACID CHELATE,) 133 MG tablet, Take 1 tablet by mouth 2 (two) times daily., Disp: , Rfl:    Tiotropium Bromide-Olodaterol (STIOLTO RESPIMAT) 2.5-2.5 MCG/ACT AERS, Inhale 2 each into the lungs daily., Disp: , Rfl:    triamcinolone ointment (KENALOG) 0.5 %, Apply 1 application topically 2 (two) times daily., Disp: 30 g, Rfl: 0   vitamin B-12 (CYANOCOBALAMIN) 1000 MCG tablet, Take 1,000 mcg by mouth daily., Disp: , Rfl:   cefTRIAXone (ROCEPHIN)  IV     octreotide (SANDOSTATIN) 500 mcg in  sodium chloride 0.9 % 250 mL (2 mcg/mL) infusion         Objective     Vitals:   12/02/22 1225 12/02/22 1227 12/02/22 1530  BP:  134/77 (!) 195/91  Pulse:  83 88  Resp:  18 19  Temp:  98 F (36.7 C)   TempSrc:  Oral   SpO2:  97% 98%  Weight: 77.6 kg    Height: 5' 10" (1.778 m)       Physical Exam Vitals and nursing note reviewed.  Constitutional:      General: He is not in acute distress.    Appearance: He is ill-appearing (chronically). He is not toxic-appearing or diaphoretic.  HENT:     Head: Normocephalic and atraumatic.     Nose: Nose normal.     Mouth/Throat:     Mouth: Mucous membranes are moist.     Pharynx: Oropharynx is clear.  Eyes:     General: Scleral icterus present.     Extraocular Movements: Extraocular movements intact.  Cardiovascular:     Rate and Rhythm: Tachycardia present. Rhythm irregular.  Pulmonary:     Effort: Pulmonary effort is normal. No respiratory distress.     Breath sounds: No wheezing, rhonchi or rales.     Comments: Diminished bilaterally Abdominal:     General: Bowel sounds are normal. There is no distension.     Palpations: Abdomen is soft.     Tenderness: There is no abdominal tenderness. There is no guarding or rebound.  Musculoskeletal:     Cervical back: Neck supple.     Right lower leg: Edema (trace) present.     Left lower leg: Edema (trace) present.  Skin:    General: Skin is warm and dry.     Coloration: Skin is jaundiced. Skin is not pale.     Comments: Telangiectasias noted on the face  Neurological:     General: No focal deficit present.     Mental Status: He is alert and oriented to person, place, and time. Mental status is at baseline.     Comments: No asterixis  Psychiatric:        Mood and Affect: Mood normal.        Behavior: Behavior normal.        Thought Content: Thought content normal.        Judgment: Judgment normal.     Laboratory Data Recent Labs  Lab 12/02/22 1230  WBC 5.1  HGB 9.2*  HCT  27.9*  PLT 109*   Recent Labs  Lab 12/02/22 1230  NA 128*  K 3.8  CL 100  CO2 22  BUN 9  CALCIUM 8.5*  PROT 6.6  BILITOT 3.8*  ALKPHOS 118  ALT 18  AST 38  GLUCOSE 149*   No results for input(s): "INR" in the last 168 hours.  No results for input(s): "LIPASE" in the last 72 hours.      Imaging Studies: DG Chest 2 View  Result Date: 12/02/2022 CLINICAL DATA:  Shortness of breath. EXAM: CHEST - 2 VIEW COMPARISON:  August 29, 2022. FINDINGS: Stable cardiomediastinal silhouette. Status post coronary bypass graft. Mildly increased diffuse lung opacities are noted most consistent with worsening of pulmonary edema with small bilateral pleural effusions. Bony thorax is unremarkable. IMPRESSION: Findings most consistent with mildly increased pulmonary edema with small bilateral pleural effusions. Electronically Signed   By: James  Green Jr M.D.   On: 12/02/2022 12:47    Assessment:  # Compensated cirrhosis 2/2 etoh use - MELD 3.0 (26) Child Pugh C - 2/2 to EtOH - no h/o variceal bleed, sbp, encephalopathy - Followed   by [facility]- last seen ~ February 2024  # Epistaxis - episode this morning  # Melena- suspected 2/2 epistaxis vs PHG - hemodynamically stable  # EtOH use dependedence - last intake yesterday - h/o withdrawal seizure  # Portal hypertension - hypoalbuminemia, thrombocytopenia, hyperbili, hyponatremia, hypercoag - possible GIB 08/2021- phg seen on egd, no other source seen- no EV  # Macrocytic Anemia - 2/2 etoh abuse - hgb currently at baseline ~9.2 - Vital signs stable  # Afib on eliquis - last dose this morning  # Barretts Esophagus  # GERD  # COPD # HTN # HLD  Plan:  Esophagogastroduodenoscopy planned for tomorrow pending patient stability and endoscopy suite availability NPO at midnight. Sips and chips ok now Labs in am- cmp, cbc, inr Protonix 40 mg iv q12 h Hold dvt ppx and eliquis Octreotide bolus and gtt for 72 hours  total Ceftriaxone 1 g iv q24 for 7 days total for sbp ppx Monitor H&H.  Transfusion and resuscitation as per primary team Avoid frequent lab draws to prevent lab induced anemia Supportive care and antiemetics as per primary team Maintain two sites IV access Avoid nsaids Monitor for GIB. CIWA protocol [facility] reviewed that are available via care everywhere Vitamin K 5 mg po now Counseled on complete etoh cessation and high decompensation/mortality  Esophagogastroduodenoscopy with possible biopsy, control of bleeding, polypectomy, and interventions as necessary has been discussed with the patient/patient representative. Informed consent was obtained from the patient/patient representative after explaining the indication, nature, and risks of the procedure including but not limited to death, bleeding, perforation, missed neoplasm/lesions, cardiorespiratory compromise, and reaction to medications. Opportunity for questions was given and appropriate answers were provided. Patient/patient representative has verbalized understanding is amenable to undergoing the procedure.   I personally performed the service.  Management of other medical comorbidities as per primary team  Thank you for allowing us to participate in this patient's care. Please don't hesitate to call if any questions or concerns arise.   Chakita Mcgraw Michael Milcah Dulany, DO [facility]  Portions of the record may have been created with voice recognition software. Occasional wrong-word or 'sound-a-like' substitutions may have occurred due to the inherent limitations of voice recognition software.  Read the chart carefully and recognize, using context, where substitutions may have occurred.  

## 2022-12-02 NOTE — Assessment & Plan Note (Addendum)
Patient states he continues to drink daily at least 2 beers and then secretly drinks vodka as well.  Last drink was yesterday evening  - CIWA monitoring with as needed Ativan - Daily thiamine, folic acid and multivitamin

## 2022-12-02 NOTE — ED Provider Notes (Addendum)
[facility] Provider Note    Event Date/Time   First MD Initiated Contact with Patient 12/02/22 1507     (approximate)   History   Epistaxis   HPI  Kenneth Stevenson is a 77 y.o. male  with CABG, CHF, HTN, A-fib on eliquis, acloholic cirrhosis who comes in with SOB.  Patient reports multiple different bleeding issues.  He reports concern for an episode of dark tarry stool as well as intermittent nosebleeds as well as intermittent shortness of breath.  Patient reports nosebleeds have been out of the right nostril have been going on for the past 3 days.  He reports the dark tarry stool started 2-day.  He reports the shortness of breath has been intermittent.  No known diuretics and he does report some increasing swelling in his legs.  He denies any falls hitting his head.  Family is at bedside and daughter is on the phone who does report that he has a history of GI bleeding in the past unclear of the exact cause.  They do report that he has alcoholic cirrhosis and still drinks 2 drinks daily.  Has had issues with his ammonia level in the past as well.   Physical Exam   Triage Vital Signs: ED Triage Vitals  Enc Vitals Group     BP 12/02/22 1227 134/77     Pulse Rate 12/02/22 1227 83     Resp 12/02/22 1227 18     Temp 12/02/22 1227 98 F (36.7 C)     Temp Source 12/02/22 1227 Oral     SpO2 12/02/22 1227 97 %     Weight 12/02/22 1225 171 lb (77.6 kg)     Height 12/02/22 1225 5\' 10"  (1.778 m)     Head Circumference --      Peak Flow --      Pain Score 12/02/22 1224 0     Pain Loc --      Pain Edu? --      Excl. in GC? --     Most recent vital signs: Vitals:   12/02/22 1227  BP: 134/77  Pulse: 83  Resp: 18  Temp: 98 F (36.7 C)  SpO2: 97%     General: Awake, no distress.  CV:  Good peripheral perfusion.  Resp:  Normal effort.  Abd:  No distention.  Other:  Edema noted in bilateral legs.  Abdomen soft nontender. Dried blood noted in right  nostril.  No active bleeding Very small amount of stool noted in the vault does appear dark but difficult to tell if it is melanotic or not.  Does stain positively for Hemoccult  ED Results / Procedures / Treatments   Labs (all labs ordered are listed, but only abnormal results are displayed) Labs Reviewed  BASIC METABOLIC PANEL - Abnormal; Notable for the following components:      Result Value   Sodium 128 (*)    Glucose, Bld 149 (*)    Calcium 8.5 (*)    All other components within normal limits  CBC - Abnormal; Notable for the following components:   RBC 2.56 (*)    Hemoglobin 9.2 (*)    HCT 27.9 (*)    MCV 109.0 (*)    MCH 35.9 (*)    RDW 16.6 (*)    Platelets 109 (*)    All other components within normal limits  TROPONIN I (HIGH SENSITIVITY)  TROPONIN I (HIGH SENSITIVITY)     EKG  My interpretation  of EKG:  Atrial fibrillation rate of 89 without any ST elevation, no T wave inversions, QTc of 506  RADIOLOGY I have reviewed the xray personally and interpreted and concerning for pulmonary edema with pleural effusions  PROCEDURES:  Critical Care performed: Yes, see critical care procedure note(s)  .1-3 Lead EKG Interpretation  Performed by: Concha Se, MD Authorized by: Concha Se, MD     Interpretation: normal     ECG rate:  80   ECG rate assessment: normal     Rhythm: atrial fibrillation     Ectopy: none     Conduction: normal   .Critical Care  Performed by: Concha Se, MD Authorized by: Concha Se, MD   Critical care provider statement:    Critical care time (minutes):  30   Critical care was necessary to treat or prevent imminent or life-threatening deterioration of the following conditions: GI bleed/CHF.   Critical care was time spent personally by me on the following activities:  Development of treatment plan with patient or surrogate, discussions with consultants, evaluation of patient's response to treatment, examination of patient,  ordering and review of laboratory studies, ordering and review of radiographic studies, ordering and performing treatments and interventions, pulse oximetry, re-evaluation of patient's condition and review of old charts    MEDICATIONS ORDERED IN ED: Medications  pantoprazole (PROTONIX) injection 40 mg (has no administration in time range)  octreotide (SANDOSTATIN) 2 mcg/mL load via infusion 50 mcg (has no administration in time range)    And  octreotide (SANDOSTATIN) 500 mcg in sodium chloride 0.9 % 250 mL (2 mcg/mL) infusion (has no administration in time range)  cefTRIAXone (ROCEPHIN) 2 g in sodium chloride 0.9 % 100 mL IVPB (has no administration in time range)  furosemide (LASIX) injection 40 mg (has no administration in time range)     IMPRESSION / MDM / ASSESSMENT AND PLAN / ED COURSE  I reviewed the triage vital signs and the nursing notes.   Patient's presentation is most consistent with acute presentation with potential threat to life or bodily function.   Patient comes in with concern for potential GI bleed in the setting of known alcoholic cirrhosis.  Will start patient on Protonix, octreotide, ceftriaxone although abdomen is soft nontender low suspicion for SBP.  Patient with some shortness of breath labs ordered evaluate for CHF, ACS.  He denies any falls hitting his head.  BMP shows low sodium but suspect this is from extra fluid on him.  CBC shows stable hemoglobin of 9.2 from 3 months ago.  Platelets slightly low at 109 but similar to prior.  Initial troponin was negative  Will discuss the hospital team for admission will give a dose of IV Lasix given concern for fluid overload  I did discuss the case with Dr. Timothy Lasso from GI as well.   The patient is on the cardiac monitor to evaluate for evidence of arrhythmia and/or significant heart rate changes.      FINAL CLINICAL IMPRESSION(S) / ED DIAGNOSES   Final diagnoses:  Epistaxis  Gastrointestinal hemorrhage,  unspecified gastrointestinal hemorrhage type  Acute congestive heart failure, unspecified heart failure type  Hyponatremia     Rx / DC Orders   ED Discharge Orders     None        Note:  This document was prepared using Dragon voice recognition software and may include unintentional dictation errors.   Concha Se, MD 12/02/22 [id number]    Concha Se, MD  12/02/22 1604  

## 2022-12-02 NOTE — Assessment & Plan Note (Signed)
Continue home antihypertensives 

## 2022-12-02 NOTE — Consult Note (Signed)
Inpatient Consultation   Patient ID: Kenneth Stevenson is a 77 y.o. male.  Requesting Provider: Artis Delay, MD (Emergency Medicine)  Date of Admission: 12/02/2022  Date of Consult: 12/02/22   Reason for Consultation: Melena, Cirrhosis   Patient's Chief Complaint:   Chief Complaint  Patient presents with   Epistaxis    77 y/o c compensated cirrhosis 2/2 etoh, EtOH dependence, A fib on OAC, CAD s/p CABG and DES, HTN, CHF, COPD, Barretts Esophagus, CVA who presents to the hospital with dark stool, weakness and epistaxis.  GI is consulted out of concern for upper GI bleeding and melena.  He is accompanied by his wife at bedside.  Patient reports that he has had epistaxis for several months, but increasing in frequency and severity especially over the last 2 days.  His hemoglobin is stable from January around 9.2.  Platelets are currently 109,000.  Vital signs are stable, in fact he is hypertensive.  He had 1 episode of dark stool this morning, but stools are otherwise brown.  He denies any hematochezia nausea vomiting abdominal pain.  He reports some lower extremity swelling but is not on any diuretics.  Denies any abdominal distention.  He does report shortness of breath.  He is seen his hepatologist as recently as late February early March.  He was transition from metoprolol to carvedilol 12 and half milligrams twice a day for improved coverage of portal hypertension.  He keeps many of his medications in the old bottles as he has a full bottle of omeprazole with a bottle marked filled in January 2023, but he reports he is actively taking this and just combines bottles.  His wife reports he sometimes has difficulty with directions, but overall mentation does okay.  He is not currently on lactulose or Xifaxan  No fevers chills dysphagia or odynophagia.  Last drink of alcohol was yesterday evening with 2 beers (12 ounces) and 1 shot of vodka.  Reports he took his eliquis this morning. Thinks  he is on plavix but this is not in his medication bag or MAR from the [place] notes.  Denies NSAIDs, Anti-plt agents Denies family history of gastrointestinal disease and malignancy Previous [facility]op[facility] possible PHG; no signs of varices or bleeding and Colonoscopy (09/2021)- normal   Past Medical History:  Diagnosis Date   CHF (congestive heart failure) (HCC)    Hypertension    S/P CABG x 3     No past surgical history on file.  Allergies  Allergen Reactions   Simvastatin    Empagliflozin Other (See Comments)    Other reaction(s): Tremor, Disorientated, Increased frequency of urination    No family history on file.  Social History   Tobacco Use   Smoking status: Never   Smokeless tobacco: Never  Substance Use Topics   Alcohol use: Yes   Drug use: No     Pertinent GI related history and allergies were reviewed with the patient  Review of Systems  Constitutional:  Positive for fatigue. Negative for activity change, appetite change, chills, diaphoresis, fever and unexpected weight change.  HENT:  Positive for nosebleeds. Negative for trouble swallowing and voice change.   Respiratory:  Positive for shortness of breath. Negative for wheezing.   Cardiovascular:  Positive for leg swelling. Negative for chest pain and palpitations.  Gastrointestinal:  Positive for blood in stool (dark stool, no hematochezia). Negative for abdominal distention, abdominal pain, anal bleeding, constipation, diarrhea, nausea and vomiting.  Musculoskeletal:  Negative for arthralgias and myalgias.  Skin:  Negative for color change and pallor.  Neurological:  Negative for dizziness, syncope and weakness.  Psychiatric/Behavioral:  Positive for confusion (reported by his wife). The patient is not nervous/anxious.   All other systems reviewed and are negative.    Medications Home Medications No current facility-administered medications on file prior to encounter.    Current Outpatient Medications on File Prior to Encounter  Medication Sig Dispense Refill   albuterol (PROVENTIL HFA;VENTOLIN HFA) 108 (90 Base) MCG/ACT inhaler Inhale 1 puff into the lungs every 6 (six) hours as needed for wheezing or shortness of breath.     alendronate (FOSAMAX) 70 MG tablet TAKE ONE TABLET BY MOUTH ONCE WEEKLY TAKE FIRST THING IN MORNING WITH 8 OUNCES OF WATER 30 MINUTES BEFORE FOOD OR DRINK.REMAIN UPRIGHT FOR 30 MINUTES.     allopurinol (ZYLOPRIM) 100 MG tablet Take 100 mg by mouth daily.     amLODipine (NORVASC) 5 MG tablet Take 5 mg by mouth daily.     apixaban (ELIQUIS) 5 MG TABS tablet Take 5 mg by mouth 2 (two) times daily.     aspirin 81 MG tablet Take 81 mg by mouth daily. (Patient not taking: Reported on 05/30/2022)     atorvastatin (LIPITOR) 80 MG tablet Take 80 mg by mouth daily.     carvedilol (COREG) 12.5 MG tablet Take 12.5 mg by mouth 2 (two) times daily with a meal.     cetirizine (ZYRTEC) 10 MG tablet TAKE ONE TABLET BY MOUTH EVERY DAY AS NEEDED FOR ITCHINESS     clopidogrel (PLAVIX) 75 MG tablet Take 75 mg by mouth daily.     colchicine 0.6 MG tablet 2 tablets stat, then 1 tablet tid until relief or stomach upset 30 tablet 0   cyanocobalamin 100 MCG tablet Take 1 tablet by mouth daily.     diclofenac Sodium (VOLTAREN) 1 % GEL APPLY 4 GRAMS TOPICALLY EVERY 6 HOURS FOR BACK PAIN     levETIRAcetam (KEPPRA) 500 MG tablet Take 1 tablet (500 mg total) by mouth 2 (two) times daily. 60 tablet 0   lisinopril (PRINIVIL,ZESTRIL) 40 MG tablet Take 40 mg by mouth daily.     metoprolol (TOPROL-XL) 200 MG 24 hr tablet Take 200 mg by mouth daily.     ondansetron (ZOFRAN-ODT) 4 MG disintegrating tablet Take 1 tablet (4 mg total) by mouth every 8 (eight) hours as needed for nausea or vomiting. 20 tablet 0   prednisoLONE acetate (PRED FORTE) 1 % ophthalmic suspension INSTILL 1 DROP IN RIGHT EYE FOUR TIMES A DAY (Patient not taking: Reported on 05/30/2022)     pyridOXINE  (VITAMIN B-6) 100 MG tablet Take 100 mg by mouth daily.     Specialty Vitamins Products (MAGNESIUM, AMINO ACID CHELATE,) 133 MG tablet Take 1 tablet by mouth 2 (two) times daily.     Tiotropium Bromide-Olodaterol (STIOLTO RESPIMAT) 2.5-2.5 MCG/ACT AERS Inhale 2 each into the lungs daily.     triamcinolone ointment (KENALOG) 0.5 % Apply 1 application topically 2 (two) times daily. 30 g 0   vitamin B-12 (CYANOCOBALAMIN) 1000 MCG tablet Take 1,000 mcg by mouth daily.     Pertinent GI related medications were reviewed with the patient  Inpatient Medications  Current Facility-Administered Medications:    cefTRIAXone (ROCEPHIN) 2 g in sodium chloride 0.9 % 100 mL IVPB, 2 g, Intravenous, Once, Concha SeFunke, Mary E, MD   furosemide (LASIX) injection 40 mg, 40 mg, Intravenous, Once, Concha SeFunke, Mary E, MD  octreotide (SANDOSTATIN) 2 mcg/mL load via infusion 50 mcg, 50 mcg, Intravenous, Once **AND** octreotide (SANDOSTATIN) 500 mcg in sodium chloride 0.9 % 250 mL (2 mcg/mL) infusion, 50 mcg/hr, Intravenous, Continuous, Funke, Mary E, MD   pantoprazole (PROTONIX) injection 40 mg, 40 mg, Intravenous, Once, Concha Se, MD  Current Outpatient Medications:    albuterol (PROVENTIL HFA;VENTOLIN HFA) 108 (90 Base) MCG/ACT inhaler, Inhale 1 puff into the lungs every 6 (six) hours as needed for wheezing or shortness of breath., Disp: , Rfl:    alendronate (FOSAMAX) 70 MG tablet, TAKE ONE TABLET BY MOUTH ONCE WEEKLY TAKE FIRST THING IN MORNING WITH 8 OUNCES OF WATER 30 MINUTES BEFORE FOOD OR DRINK.REMAIN UPRIGHT FOR 30 MINUTES., Disp: , Rfl:    allopurinol (ZYLOPRIM) 100 MG tablet, Take 100 mg by mouth daily., Disp: , Rfl:    amLODipine (NORVASC) 5 MG tablet, Take 5 mg by mouth daily., Disp: , Rfl:    apixaban (ELIQUIS) 5 MG TABS tablet, Take 5 mg by mouth 2 (two) times daily., Disp: , Rfl:    aspirin 81 MG tablet, Take 81 mg by mouth daily. (Patient not taking: Reported on 05/30/2022), Disp: , Rfl:    atorvastatin  (LIPITOR) 80 MG tablet, Take 80 mg by mouth daily., Disp: , Rfl:    carvedilol (COREG) 12.5 MG tablet, Take 12.5 mg by mouth 2 (two) times daily with a meal., Disp: , Rfl:    cetirizine (ZYRTEC) 10 MG tablet, TAKE ONE TABLET BY MOUTH EVERY DAY AS NEEDED FOR ITCHINESS, Disp: , Rfl:    clopidogrel (PLAVIX) 75 MG tablet, Take 75 mg by mouth daily., Disp: , Rfl:    colchicine 0.6 MG tablet, 2 tablets stat, then 1 tablet tid until relief or stomach upset, Disp: 30 tablet, Rfl: 0   cyanocobalamin 100 MCG tablet, Take 1 tablet by mouth daily., Disp: , Rfl:    diclofenac Sodium (VOLTAREN) 1 % GEL, APPLY 4 GRAMS TOPICALLY EVERY 6 HOURS FOR BACK PAIN, Disp: , Rfl:    levETIRAcetam (KEPPRA) 500 MG tablet, Take 1 tablet (500 mg total) by mouth 2 (two) times daily., Disp: 60 tablet, Rfl: 0   lisinopril (PRINIVIL,ZESTRIL) 40 MG tablet, Take 40 mg by mouth daily., Disp: , Rfl:    metoprolol (TOPROL-XL) 200 MG 24 hr tablet, Take 200 mg by mouth daily., Disp: , Rfl:    ondansetron (ZOFRAN-ODT) 4 MG disintegrating tablet, Take 1 tablet (4 mg total) by mouth every 8 (eight) hours as needed for nausea or vomiting., Disp: 20 tablet, Rfl: 0   prednisoLONE acetate (PRED FORTE) 1 % ophthalmic suspension, INSTILL 1 DROP IN RIGHT EYE FOUR TIMES A DAY (Patient not taking: Reported on 05/30/2022), Disp: , Rfl:    pyridOXINE (VITAMIN B-6) 100 MG tablet, Take 100 mg by mouth daily., Disp: , Rfl:    Specialty Vitamins Products (MAGNESIUM, AMINO ACID CHELATE,) 133 MG tablet, Take 1 tablet by mouth 2 (two) times daily., Disp: , Rfl:    Tiotropium Bromide-Olodaterol (STIOLTO RESPIMAT) 2.5-2.5 MCG/ACT AERS, Inhale 2 each into the lungs daily., Disp: , Rfl:    triamcinolone ointment (KENALOG) 0.5 %, Apply 1 application topically 2 (two) times daily., Disp: 30 g, Rfl: 0   vitamin B-12 (CYANOCOBALAMIN) 1000 MCG tablet, Take 1,000 mcg by mouth daily., Disp: , Rfl:   cefTRIAXone (ROCEPHIN)  IV     octreotide (SANDOSTATIN) 500 mcg in  sodium chloride 0.9 % 250 mL (2 mcg/mL) infusion         Objective  Vitals:   12/02/22 1225 12/02/22 1227 12/02/22 1530  BP:  134/77 (!) 195/91  Pulse:  83 88  Resp:  18 19  Temp:  98 F (36.7 C)   TempSrc:  Oral   SpO2:  97% 98%  Weight: 77.6 kg    Height: 5\' 10"  (1.778 m)       Physical Exam Vitals and nursing note reviewed.  Constitutional:      General: He is not in acute distress.    Appearance: He is ill-appearing (chronically). He is not toxic-appearing or diaphoretic.  HENT:     Head: Normocephalic and atraumatic.     Nose: Nose normal.     Mouth/Throat:     Mouth: Mucous membranes are moist.     Pharynx: Oropharynx is clear.  Eyes:     General: Scleral icterus present.     Extraocular Movements: Extraocular movements intact.  Cardiovascular:     Rate and Rhythm: Tachycardia present. Rhythm irregular.  Pulmonary:     Effort: Pulmonary effort is normal. No respiratory distress.     Breath sounds: No wheezing, rhonchi or rales.     Comments: Diminished bilaterally Abdominal:     General: Bowel sounds are normal. There is no distension.     Palpations: Abdomen is soft.     Tenderness: There is no abdominal tenderness. There is no guarding or rebound.  Musculoskeletal:     Cervical back: Neck supple.     Right lower leg: Edema (trace) present.     Left lower leg: Edema (trace) present.  Skin:    General: Skin is warm and dry.     Coloration: Skin is jaundiced. Skin is not pale.     Comments: Telangiectasias noted on the face  Neurological:     General: No focal deficit present.     Mental Status: He is alert and oriented to person, place, and time. Mental status is at baseline.     Comments: No asterixis  Psychiatric:        Mood and Affect: Mood normal.        Behavior: Behavior normal.        Thought Content: Thought content normal.        Judgment: Judgment normal.     Laboratory Data Recent Labs  Lab 12/02/22 1230  WBC 5.1  HGB 9.2*  HCT  27.9*  PLT 109*   Recent Labs  Lab 12/02/22 1230  NA 128*  K 3.8  CL 100  CO2 22  BUN 9  CALCIUM 8.5*  PROT 6.6  BILITOT 3.8*  ALKPHOS 118  ALT 18  AST 38  GLUCOSE 149*   No results for input(s): "INR" in the last 168 hours.  No results for input(s): "LIPASE" in the last 72 hours.      Imaging Studies: DG Chest 2 View  Result Date: 12/02/2022 CLINICAL DATA:  Shortness of breath. EXAM: CHEST - 2 VIEW COMPARISON:  August 29, 2022. FINDINGS: Stable cardiomediastinal silhouette. Status post coronary bypass graft. Mildly increased diffuse lung opacities are noted most consistent with worsening of pulmonary edema with small bilateral pleural effusions. Bony thorax is unremarkable. IMPRESSION: Findings most consistent with mildly increased pulmonary edema with small bilateral pleural effusions. Electronically Signed   By: Lupita Raider M.D.   On: 12/02/2022 12:47    Assessment:  # Compensated cirrhosis 2/2 etoh use - MELD 3.0 (26) Child Pugh C - 2/2 to EtOH - no h/o variceal bleed, sbp, encephalopathy - Followed  by [facility] Hepatology- last seen ~ February 2024  # Epistaxis - episode this morning  # Melena- suspected 2/2 epistaxis vs PHG - hemodynamically stable  # EtOH use dependedence - last intake yesterday - h/o withdrawal seizure  # Portal hypertension - hypoalbuminemia, thrombocytopenia, hyperbili, hyponatremia, hypercoag - possible GIB 08/2021- phg seen on egd, no other source seen- no EV  # Macrocytic Anemia - 2/2 etoh abuse - hgb currently at baseline ~9.2 - Vital signs stable  # Afib on eliquis - last dose this morning  # Barretts Esophagus  # GERD  # COPD # HTN # HLD  Plan:  Esophagogastroduodenoscopy planned for tomorrow pending patient stability and endoscopy suite availability NPO at midnight. Sips and chips ok now Labs in am- cmp, cbc, inr Protonix 40 mg iv q12 h Hold dvt ppx and eliquis Octreotide bolus and gtt for 72 hours  total Ceftriaxone 1 g iv q24 for 7 days total for sbp ppx Monitor H&H.  Transfusion and resuscitation as per primary team Avoid frequent lab draws to prevent lab induced anemia Supportive care and antiemetics as per primary team Maintain two sites IV access Avoid nsaids Monitor for GIB. [facility] protocol [facility] reviewed that are available via care everywhere Vitamin K 5 mg po now Counseled on complete etoh cessation and high decompensation/mortality  Esophagogastroduodenoscopy with possible biopsy, control of bleeding, polypectomy, and interventions as necessary has been discussed with the patient/patient representative. Informed consent was obtained from the patient/patient representative after explaining the indication, nature, and risks of the procedure including but not limited to death, bleeding, perforation, missed neoplasm/lesions, cardiorespiratory compromise, and reaction to medications. Opportunity for questions was given and appropriate answers were provided. Patient/patient representative has verbalized understanding is amenable to undergoing the procedure.   I personally performed the service.  Management of other medical comorbidities as per primary team  Thank you for allowing [place] to participate in this patient's care. Please don't hesitate to call if any questions or concerns arise.   Jaynie Collins, DO [facility]  Portions of the record may have been created with voice recognition software. Occasional wrong-word or 'sound-a-like' substitutions may have occurred due to the inherent limitations of voice recognition software.  Read the chart carefully and recognize, using context, where substitutions may have occurred.

## 2022-12-02 NOTE — ED Triage Notes (Addendum)
Pt to ED via POV from home. Pt reports nosebleed intermittently x2-3 days that was worse this morning. Bleeding controlled at this time. Pt also reports SOB that has been ongoing. Pt also reports 1 episode of dark tarry stool. Pt reports hx of CABG, CHF and HTN. Pt is on Eliquis. Pt denies CP.

## 2022-12-02 NOTE — Assessment & Plan Note (Signed)
No wheezing on examination today.  Shortness of breath in the setting of heart failure, not COPD.  -Continue home bronchodilators

## 2022-12-02 NOTE — Assessment & Plan Note (Addendum)
Potentially multifactorial in the setting of Eliquis use and elevated INR secondary to cirrhosis.  Resolved at this time, however high risk for recurrence  - Hold home Eliquis - Will use Afrin and consult ENT if epistaxis recurs and is persistent

## 2022-12-02 NOTE — Assessment & Plan Note (Addendum)
History of compensated cirrhosis in the setting of alcohol use disorder that is active.  No prior decompensation including encephalopathy or ascites.  He follows with GI at the [facility]

## 2022-12-02 NOTE — Assessment & Plan Note (Signed)
Patient is presenting with chronic shortness of breath with acute worsening today in addition to bilateral lower extremity edema.  Patient notes that he has a history of CHF, however unable to see prior documentation or echocardiogram.   - Telemetry monitoring - Start Lasix 40 mg IV daily - Strict in and out - Daily weights - Echocardiogram ordered

## 2022-12-02 NOTE — H&P (Addendum)
History and Physical    Patient: Kenneth Stevenson OIB:[id number] DOB: Dec 08, 1945 DOA: 12/02/2022 DOS: the patient was seen and examined on 12/02/2022 PCP: [facility]  Patient coming from: Home  Chief Complaint:  Chief Complaint  Patient presents with   Epistaxis   HPI: Kenneth Stevenson is a 77 y.o. male with medical history significant of compensated cirrhosis secondary to alcohol use disorder, CAD s/p CABG (2009) and DES (LM, LCx  2011, LCx 2014), atrial fibrillation on Eliquis, COPD, hypertension, hyperlipidemia, asbestosis exposure with interstitial lung disease, polymyalgia rheumatica, gout, Barrett's esophagus, osteoporosis, CVA, who presents to the ED with complaints of a nosebleed.  Mr. Pelfrey states that he has been having nosebleeds for the last several months, however they have become more frequent over the past 1 week and especially severe over the last 2 days.  His most recent nosebleed was since arrival in the ED.  He is usually able to stop them on his own.  He believes he may have swallowed some of the blood.  This morning, he had one-time episode of melena but states his bowel movement afterwards appeared brown.  He denies any bright red blood per rectum.  He denies any nausea, vomiting, abdominal pain or abdominal distention.  He also endorses shortness of breath both at rest and with exertion that has been intermittent but feels worse today.  He endorses bilateral lower extremity swelling.  He notes a history of heart failure but is not currently on any diuretics.  He denies any chest pain, palpitations.  ED course: On arrival to the ED, patient was normotensive at 134/77 with heart rate of 83, blood pressure subsequently increased up to 195/91.  He was saturating at 97% on room air.  He was afebrile and 98.  Initial workup notable for WBC of 5.1, hemoglobin 9.2, platelets 109, sodium 128, glucose 149, creatinine 0.65, total bilirubin 3.8 and GFR above 60.  BNP elevated at  556.  Troponin negative at 15.  Chest x-ray was obtained that demonstrated pulmonary edema with bilateral pleural effusions.  Patient started on Lasix, Rocephin, octreotide, Protonix.  GI consulted.  TRH contacted for admission.  Review of Systems: As mentioned in the history of present illness. All other systems reviewed and are negative.  Past Medical History:  Diagnosis Date   CHF (congestive heart failure) (HCC)    Hypertension    S/P CABG x 3    No past surgical history on file. Social History:  reports that he has never smoked. He has never used smokeless tobacco. He reports current alcohol use. He reports that he does not use drugs.  Allergies  Allergen Reactions   Simvastatin    Empagliflozin Other (See Comments)    Other reaction(s): Tremor, Disorientated, Increased frequency of urination    No family history on file.  Prior to Admission medications   Medication Sig Start Date End Date Taking? Authorizing Provider  albuterol (PROVENTIL HFA;VENTOLIN HFA) 108 (90 Base) MCG/ACT inhaler Inhale 1 puff into the lungs every 6 (six) hours as needed for wheezing or shortness of breath.    [provider]  alendronate (FOSAMAX) 70 MG tablet TAKE ONE TABLET BY MOUTH ONCE WEEKLY TAKE FIRST THING IN MORNING WITH 8 OUNCES OF WATER 30 MINUTES BEFORE FOOD OR DRINK.REMAIN UPRIGHT FOR 30 MINUTES. 09/05/20   [provider]  allopurinol (ZYLOPRIM) 100 MG tablet Take 100 mg by mouth daily.    [provider]  amLODipine (NORVASC) 5 MG tablet Take 5 mg  by mouth daily.    [provider]  apixaban (ELIQUIS) 5 MG TABS tablet Take 5 mg by mouth 2 (two) times daily.    [provider]  aspirin 81 MG tablet Take 81 mg by mouth daily. Patient not taking: Reported on 05/30/2022    [provider]  atorvastatin (LIPITOR) 80 MG tablet Take 80 mg by mouth daily.    [provider]  carvedilol (COREG) 12.5 MG tablet Take 12.5 mg by mouth 2 (two)  times daily with a meal.    [provider]  cetirizine (ZYRTEC) 10 MG tablet TAKE ONE TABLET BY MOUTH EVERY DAY AS NEEDED FOR ITCHINESS 10/23/20   [provider]  clopidogrel (PLAVIX) 75 MG tablet Take 75 mg by mouth daily.    [provider]  colchicine 0.6 MG tablet 2 tablets stat, then 1 tablet tid until relief or stomach upset 09/13/16 05/30/22  Tommi Rumps, PA-C  cyanocobalamin 100 MCG tablet Take 1 tablet by mouth daily. 06/24/05   [provider]  diclofenac Sodium (VOLTAREN) 1 % GEL APPLY 4 GRAMS TOPICALLY EVERY 6 HOURS FOR BACK PAIN 10/23/20   [provider]  levETIRAcetam (KEPPRA) 500 MG tablet Take 1 tablet (500 mg total) by mouth 2 (two) times daily. 08/31/22   Kirstie Peri, MD  lisinopril (PRINIVIL,ZESTRIL) 40 MG tablet Take 40 mg by mouth daily.    [provider]  metoprolol (TOPROL-XL) 200 MG 24 hr tablet Take 200 mg by mouth daily.    [provider]  ondansetron (ZOFRAN-ODT) 4 MG disintegrating tablet Take 1 tablet (4 mg total) by mouth every 8 (eight) hours as needed for nausea or vomiting. 05/30/22   Sharman Cheek, MD  prednisoLONE acetate (PRED FORTE) 1 % ophthalmic suspension INSTILL 1 DROP IN RIGHT EYE FOUR TIMES A DAY Patient not taking: Reported on 05/30/2022 06/17/21   [provider]  pyridOXINE (VITAMIN B-6) 100 MG tablet Take 100 mg by mouth daily.    [provider]  Specialty Vitamins Products (MAGNESIUM, AMINO ACID CHELATE,) 133 MG tablet Take 1 tablet by mouth 2 (two) times daily.    [provider]  Tiotropium Bromide-Olodaterol (STIOLTO RESPIMAT) 2.5-2.5 MCG/ACT AERS Inhale 2 each into the lungs daily.    [provider]  triamcinolone ointment (KENALOG) 0.5 % Apply 1 application topically 2 (two) times daily. 09/13/16   Tommi Rumps, PA-C  vitamin B-12 (CYANOCOBALAMIN) 1000 MCG tablet Take 1,000 mcg by mouth daily.    [provider]    Physical  Exam: Vitals:   12/02/22 1225 12/02/22 1227 12/02/22 1530  BP:  134/77 (!) 195/91  Pulse:  83 88  Resp:  18 19  Temp:  98 F (36.7 C)   TempSrc:  Oral   SpO2:  97% 98%  Weight: 77.6 kg    Height: 5\' 10"  (1.778 m)     Physical Exam Vitals and nursing note reviewed.  Constitutional:      General: He is not in acute distress.    Appearance: He is ill-appearing (Chronically).  HENT:     Head: Normocephalic and atraumatic.     Nose:     Comments: Some dried blood in bilateral nares, right more than left but no evidence of active bleed    Mouth/Throat:     Mouth: Mucous membranes are moist.     Pharynx: Oropharynx is clear.  Eyes:     Pupils: Pupils are equal, round, and reactive to light.  Cardiovascular:  Rate and Rhythm: Normal rate. Rhythm irregular.     Heart sounds: No murmur heard.    No gallop.  Pulmonary:     Effort: Pulmonary effort is normal. No respiratory distress.     Breath sounds: Rales (Bilaterally up to the mid lung fields) present. No wheezing or rhonchi.  Abdominal:     General: Bowel sounds are normal. There is no distension.     Palpations: Abdomen is soft.     Tenderness: There is no abdominal tenderness. There is no guarding.  Musculoskeletal:     Cervical back: Neck supple.     Right lower leg: 1+ Pitting Edema present.     Left lower leg: 1+ Pitting Edema present.  Skin:    General: Skin is warm and dry.     Comments: Facial telangiectasia noted  Neurological:     General: No focal deficit present.     Mental Status: He is alert and oriented to person, place, and time. Mental status is at baseline.  Psychiatric:        Mood and Affect: Mood normal.        Behavior: Behavior normal.    Data Reviewed: CBC with WBC of 5.1, hemoglobin 9.2, MCV 109 and platelets of 109 CMP with sodium of 128, potassium 3.8, bicarb 22, glucose 149, creatinine 0.65, BUN 9, AST 38, ALT 18, total bilirubin 3.8 and GFR above 60 Troponin negative at 15 BNP elevated  at 556  EKG personally reviewed.  Atrial fibrillation with rate of 89.  T wave flattening, but no other findings concerning for ischemia.  DG Chest 2 View  Result Date: 12/02/2022 CLINICAL DATA:  Shortness of breath. EXAM: CHEST - 2 VIEW COMPARISON:  August 29, 2022. FINDINGS: Stable cardiomediastinal silhouette. Status post coronary bypass graft. Mildly increased diffuse lung opacities are noted most consistent with worsening of pulmonary edema with small bilateral pleural effusions. Bony thorax is unremarkable. IMPRESSION: Findings most consistent with mildly increased pulmonary edema with small bilateral pleural effusions. Electronically Signed   By: Lupita RaiderJames  Green Jr M.D.   On: 12/02/2022 12:47    Results are pending, will review when available.  Assessment and Plan:  * Acute on chronic heart failure Patient is presenting with chronic shortness of breath with acute worsening today in addition to bilateral lower extremity edema.  Patient notes that he has a history of CHF, however unable to see prior documentation or echocardiogram.   - Telemetry monitoring - Start Lasix 40 mg IV daily - Strict in and out - Daily weights - Echocardiogram ordered  Melena Patient reports one-time episode of melena today.  Likely secondary to epistaxis, however he is high risk given history of cirrhosis and portal gastropathy on anti-coagulation.  Hemoglobin stable at this time.  Last dose of Eliquis this morning.  - GI consulted; appreciate their recommendations - Clear liquid diet - N.p.o. at midnight - Continue octreotide infusion - Protonix 40 mg IV BID - Continue ceftriaxone for SBP prophylaxis - Hold home Eliquis - Daily CBC  Epistaxis Potentially multifactorial in the setting of Eliquis use and elevated INR secondary to cirrhosis.  Resolved at this time, however high risk for recurrence  - Hold home Eliquis - Will use Afrin and consult ENT if epistaxis recurs and is persistent  Alcohol  abuse Patient states he continues to drink daily at least 2 beers and then secretly drinks vodka as well.  Last drink was yesterday evening  - CIWA monitoring with as needed Ativan -  Daily thiamine, folic acid and multivitamin  Alcoholic cirrhosis History of compensated cirrhosis in the setting of alcohol use disorder that is active.  No prior decompensation including encephalopathy or ascites.  [facility]  CAD (coronary artery disease) History of extensive CAD s/p CABG and multiple stents.  Last stent was in 2014.  Given possible GI bleed, will hold home Plavix for today and restart as soon as possible  - Continue home beta-blocker - Holding home Plavix for today  Atrial fibrillation, chronic - Holding home Eliquis - Continue home beta-blocker  Essential hypertension - Continue home antihypertensives  COPD (chronic obstructive pulmonary disease) No wheezing on examination today.  Shortness of breath in the setting of heart failure, not COPD.  -Continue home bronchodilators  Advance Care Planning: Full code verified by patient.  He stated he would not want long-term life support, but would like short-term resuscitative efforts  Consults: GI  Family Communication: Patient's wife updated at bedside  Severity of Illness: The appropriate patient status for this patient is INPATIENT. Inpatient status is judged to be reasonable and necessary in order to provide the required intensity of service to ensure the patient's safety. The patient's presenting symptoms, physical exam findings, and initial radiographic and laboratory data in the context of their chronic comorbidities is felt to place them at high risk for further clinical deterioration. Furthermore, it is not anticipated that the patient will be medically stable for discharge from the hospital within 2 midnights of admission.   * I certify that at the point of admission it is my clinical judgment that the patient  will require inpatient hospital care spanning beyond 2 midnights from the point of admission due to high intensity of service, high risk for further deterioration and high frequency of surveillance required.*  Author: Verdene Lennert, MD 12/02/2022 7:21 PM  For on call review [email or web address].

## 2022-12-02 NOTE — Assessment & Plan Note (Signed)
History of extensive CAD s/p CABG and multiple stents.  Last stent was in 2014.  Given possible GI bleed, will hold home Plavix for today and restart as soon as possible  - Continue home beta-blocker - Holding home Plavix for today

## 2022-12-03 ENCOUNTER — Encounter: Payer: Self-pay | Admitting: Internal Medicine

## 2022-12-03 ENCOUNTER — Encounter
Admission: EM | Disposition: A | Discharge status: Home / Self Care | Payer: Self-pay | Source: Home / Self Care | Attending: Internal Medicine

## 2022-12-03 ENCOUNTER — Inpatient Hospital Stay: Payer: No Typology Code available for payment source | Admitting: Certified Registered"

## 2022-12-03 DIAGNOSIS — K922 Gastrointestinal hemorrhage, unspecified: Secondary | ICD-10-CM | POA: Diagnosis not present

## 2022-12-03 DIAGNOSIS — Z7189 Other specified counseling: Secondary | ICD-10-CM

## 2022-12-03 DIAGNOSIS — I509 Heart failure, unspecified: Secondary | ICD-10-CM | POA: Diagnosis not present

## 2022-12-03 DIAGNOSIS — R040 Epistaxis: Secondary | ICD-10-CM | POA: Diagnosis not present

## 2022-12-03 DIAGNOSIS — Z515 Encounter for palliative care: Secondary | ICD-10-CM

## 2022-12-03 HISTORY — PX: ESOPHAGOGASTRODUODENOSCOPY (EGD) WITH PROPOFOL: SHX5813

## 2022-12-03 LAB — COMPREHENSIVE METABOLIC PANEL
ALT: 16 U/L (ref 0–44)
AST: 30 U/L (ref 15–41)
Albumin: 2.5 g/dL — ABNORMAL LOW (ref 3.5–5.0)
Alkaline Phosphatase: 99 U/L (ref 38–126)
Anion gap: 8 (ref 5–15)
BUN: 10 mg/dL (ref 8–23)
CO2: 26 mmol/L (ref 22–32)
Calcium: 8.4 mg/dL — ABNORMAL LOW (ref 8.9–10.3)
Chloride: 97 mmol/L — ABNORMAL LOW (ref 98–111)
Creatinine, Ser: 0.73 mg/dL (ref 0.61–1.24)
GFR, Estimated: >60 mL/min (ref >60)
Glucose, Bld: 101 mg/dL — ABNORMAL HIGH (ref 70–99)
Potassium: 3.7 mmol/L (ref 3.5–5.1)
Sodium: 131 mmol/L — ABNORMAL LOW (ref 135–145)
Total Bilirubin: 4.8 mg/dL — ABNORMAL HIGH (ref 0.3–1.2)
Total Protein: 6 g/dL — ABNORMAL LOW (ref 6.5–8.1)

## 2022-12-03 LAB — CBC WITH DIFFERENTIAL/PLATELET
Abs Immature Granulocytes: 0.02 10*3/uL (ref 0.00–0.07)
Basophils Absolute: 0 10*3/uL (ref 0.0–0.1)
Basophils Relative: 1 %
Eosinophils Absolute: 0.3 10*3/uL (ref 0.0–0.5)
Eosinophils Relative: 5 %
HCT: 26.3 % — ABNORMAL LOW (ref 39.0–52.0)
Hemoglobin: 8.9 g/dL — ABNORMAL LOW (ref 13.0–17.0)
Immature Granulocytes: 0 %
Lymphocytes Relative: 17 %
Lymphs Abs: 0.9 10*3/uL (ref 0.7–4.0)
MCH: 36.3 pg — ABNORMAL HIGH (ref 26.0–34.0)
MCHC: 33.8 g/dL (ref 30.0–36.0)
MCV: 107.3 fL — ABNORMAL HIGH (ref 80.0–100.0)
Monocytes Absolute: 0.6 10*3/uL (ref 0.1–1.0)
Monocytes Relative: 12 %
Neutro Abs: 3.6 10*3/uL (ref 1.7–7.7)
Neutrophils Relative %: 65 %
Platelets: 100 10*3/uL — ABNORMAL LOW (ref 150–400)
RBC: 2.45 MIL/uL — ABNORMAL LOW (ref 4.22–5.81)
RDW: 16.3 % — ABNORMAL HIGH (ref 11.5–15.5)
WBC: 5.4 10*3/uL (ref 4.0–10.5)
nRBC: 0 % (ref 0.0–0.2)

## 2022-12-03 LAB — PROTIME-INR
INR: 2.1 — ABNORMAL HIGH (ref 0.8–1.2)
Prothrombin Time: 23.8 seconds — ABNORMAL HIGH (ref 11.4–15.2)

## 2022-12-03 SURGERY — ESOPHAGOGASTRODUODENOSCOPY (EGD) WITH PROPOFOL
Anesthesia: General

## 2022-12-03 MED ORDER — SODIUM CHLORIDE 0.9 % IV SOLN: INTRAVENOUS | Status: DC | PRN

## 2022-12-03 MED ORDER — LIDOCAINE HCL (CARDIAC) PF 100 MG/5ML IV SOSY
PREFILLED_SYRINGE | INTRAVENOUS | Status: DC | PRN
  Administered 2022-12-03: 100 mg via INTRAVENOUS

## 2022-12-03 MED ORDER — LACTATED RINGERS IV SOLN: INTRAVENOUS | Status: DC | PRN

## 2022-12-03 MED ORDER — SODIUM CHLORIDE 0.9 % IV SOLN: Freq: Once | INTRAVENOUS | Status: AC

## 2022-12-03 MED ORDER — FUROSEMIDE 20 MG PO TABS: 20.0000 mg | ORAL_TABLET | Freq: Every day | ORAL | 0 refills | Status: DC

## 2022-12-03 MED ORDER — PROPOFOL 10 MG/ML IV BOLUS
INTRAVENOUS | Status: DC | PRN
  Administered 2022-12-03: 150 mg via INTRAVENOUS

## 2022-12-03 MED ORDER — FENTANYL CITRATE (PF) 100 MCG/2ML IJ SOLN
INTRAMUSCULAR | Status: DC | PRN
  Administered 2022-12-03 (×2): 50 ug via INTRAVENOUS

## 2022-12-03 MED ORDER — PANTOPRAZOLE SODIUM 40 MG PO TBEC
40.0000 mg | DELAYED_RELEASE_TABLET | Freq: Every day | ORAL | Status: DC
  Administered 2022-12-03: 40 mg via ORAL
  Filled 2022-12-03: qty 1

## 2022-12-03 MED ORDER — PHENYLEPHRINE HCL (PRESSORS) 10 MG/ML IV SOLN
INTRAVENOUS | Status: DC | PRN
  Administered 2022-12-03 (×2): 100 ug via INTRAVENOUS

## 2022-12-03 MED ORDER — SUCCINYLCHOLINE CHLORIDE 200 MG/10ML IV SOSY
PREFILLED_SYRINGE | INTRAVENOUS | Status: DC | PRN
  Administered 2022-12-03: 100 mg via INTRAVENOUS

## 2022-12-03 MED ORDER — FENTANYL CITRATE (PF) 100 MCG/2ML IJ SOLN
INTRAMUSCULAR | Status: AC
  Filled 2022-12-03: qty 2

## 2022-12-03 NOTE — Anesthesia Postprocedure Evaluation (Signed)
Anesthesia Post Note  Patient: Kenneth Stevenson  Procedure(s) Performed: ESOPHAGOGASTRODUODENOSCOPY (EGD) WITH PROPOFOL  Patient location during evaluation: Endoscopy Anesthesia Type: General Level of consciousness: awake and alert Pain management: pain level controlled Vital Signs Assessment: post-procedure vital signs reviewed and stable Respiratory status: spontaneous breathing, nonlabored ventilation, respiratory function stable and patient connected to nasal cannula oxygen Cardiovascular status: blood pressure returned to baseline and stable Postop Assessment: no apparent nausea or vomiting Anesthetic complications: no  No notable events documented.   Last Vitals:  Vitals:   12/03/22 1336 12/03/22 1346  BP: 110/75 131/72  Pulse: 84 91  Resp: 13 16  Temp:    SpO2: 97% 95%    Last Pain:  Vitals:   12/03/22 1346  TempSrc:   PainSc: 0-No pain                 Stephanie Coup

## 2022-12-03 NOTE — Consult Note (Signed)
Consultation Note Date: 12/03/2022   Patient Name: Kenneth Stevenson  DOB: 1945-09-07  MRN: [id number]  Age / Sex: 77 y.o., male  PCP: [facility] Referring Physician: Delfino Lovett, MD  Reason for Consultation: Establishing goals of care   HPI/Brief Hospital Course: 77 y.o. male  with past medical history of compensated cirrhosis secondary to alcohol use disorder, CAD s/p CABG (2009) and DES, A. Fib on Eliquis, COPD, HTN, HLD, asbestos exposure with interstitial lung disease, polymyalgia rheumatica and Barrett's esophagus admitted from home on 12/02/2022 with recurrent nosebleeds.  Reports recurrent nosebleeds for several months but increase in frequency over last week. Reports episode of dark tarry stool but feels this could be related to him swallowing blood related to nosebleed.  Work-up revealing: Elevated BNP at 556 CXR: pulmonary edema with bilateral pleural effusions  Started on Octreotide, Protonix and IV antibiotic therapy. GI consulted. Planning for EGD today (4/10)   Palliative medicine was consulted for assisting with goals of care conversations.  Subjective:  Extensive chart review has been completed prior to meeting patient including labs, vital signs, imaging, progress notes, orders, and available advanced directive documents from current and previous encounters.  Introduced myself as a Publishing rights manager as a member of the palliative care team. Explained palliative medicine is specialized medical care for people living with serious illness. It focuses on providing relief from the symptoms and stress of a serious illness. The goal is to improve quality of life for both the patient and the family.   Visited with Kenneth Stevenson at his bedside. Awake and alert, oriented. Denies acute pain or discomfort.  He shares a brief life review. 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 Lives at home with his wife-Kenneth Stevenson and  daughter-Kenneth Stevenson. He is able to complete ADL's independently at home and still able to drive short distances.  Shares his understanding of current medical condition. Understands he is awaiting EGD, has had several done in the past. He is able to share his understanding of chronic liver disease. Feels bleeding source is not from GI tract-reports swallowing significant amount of blood related to his nosebleed but agrees to EGD.  Denies recent changes in PO intake, appetite or weight loss.  We discussed GOC/Code Status. Encouraged [name] to consider DNR/DNI status understanding evidenced based poor outcomes in similar hospitalized patients, as the cause of the arrest is likely associated with chronic/terminal disease rather than a reversible acute cardio-pulmonary event.   [name] shares at this time he wishes to remain Full Code. He shares that he would not want long-term life sustaining interventions such as trach/PEG. Encouraged completion of Living Will.  At time of note completion, [name] has been discharged.  I discussed importance of continued conversations with family/support persons and all members of their medical team regarding overall plan of care and treatment options ensuring decisions are in alignment with patients goals of care.  All questions/concerns addressed.   Objective: Primary Diagnoses: Present on Admission:  Alcohol abuse  CAD (coronary artery disease)  Essential hypertension  COPD (chronic obstructive pulmonary disease)  Atrial fibrillation, chronic   Physical Exam Constitutional:      General: He is not in acute distress.    Appearance: He is not ill-appearing.  Pulmonary:     Effort: Pulmonary effort is normal. No respiratory distress.  Skin:    General: Skin is warm and dry.  Neurological:     Mental Status: He is alert.     Motor:  Weakness present.     Vital Signs: BP 131/72   Pulse 91   Temp 97.6 F (36.4 C) (Temporal)   Resp 16    Ht 5\' 10"  (1.778 m)   Wt 77.6 kg   SpO2 95%   BMI 24.54 kg/m  Pain Scale: 0-10   Pain Score: 0-No pain    Palliative Assessment/Data: 85%   Assessment and Plan  SUMMARY OF RECOMMENDATIONS   Full Code-continue current plan of care EGD scheduled for today  Encouraged completion of living will reflecting above stated wishes  Discussed With: Nursing staff   Thank you for this consult and allowing Palliative Medicine to participate in the care of Kenneth Stevenson. Palliative medicine will continue to follow and assist as needed.   Time Total: 40 minutes  Time spent includes: Detailed review of medical records (labs, imaging, vital signs), medically appropriate exam (mental status, respiratory, cardiac, skin), discussed with treatment team, counseling and educating patient, family and staff, documenting clinical information, medication management and coordination of care.   Signed by: Leeanne Deed, DNP, AGNP-C Palliative Medicine    Please contact Palliative Medicine Team phone at [phone] for questions and concerns.  For individual provider: See Loretha Stapler

## 2022-12-03 NOTE — ED Notes (Signed)
Pt ate approximately 40% of his meal. Pt denies abdominal pain, nausea, or vomiting. Attending MD aware.

## 2022-12-03 NOTE — Care Plan (Addendum)
Brief GI care plan  Patient status post EGD.  Only findings were known mild portal hypertensive gastropathy and Barrett's esophagus.  No sign of esophageal or gastric varices.  No signs of GAVE.  No fresh or old blood within the gastric duodenal or esophageal lumen.  MELD 3.0 (24)  Needs complete etoh cessation  At this time can discontinue Rocephin and octreotide Okay to resume low-sodium diet Transition to once daily oral PPI as the patient takes at home No further NSAIDs Monitor H&H with transfusion and resuscitation as per primary team  Consider ENT involvement before restarting Eliquis for possible intervention as his bleeding and melena are likely related to his epistaxis.  This was discussed with the patient's hospitalist team  GI to sign off. Available as needed. Please do not hesitate to call regarding questions or concerns.  Enis Slipper, DO [facility]

## 2022-12-03 NOTE — Anesthesia Preprocedure Evaluation (Signed)
Anesthesia Evaluation  Patient identified by MRN, date of birth, ID band Patient awake    Reviewed: Allergy & Precautions, NPO status , Patient's Chart, lab work & pertinent test results  Airway Mallampati: III  TM Distance: >3 FB Neck ROM: full    Dental  (+) Dental Advidsory Given   Pulmonary COPD   Pulmonary exam normal        Cardiovascular hypertension, (-) angina + CAD and +CHF  Normal cardiovascular exam+ Valvular Problems/Murmurs      Neuro/Psych Seizures -,   negative psych ROS   GI/Hepatic ,GERD  ,,(+) Cirrhosis         Endo/Other  negative endocrine ROS    Renal/GU      Musculoskeletal   Abdominal   Peds  Hematology  (+) Blood dyscrasia, anemia   Anesthesia Other Findings Past Medical History: No date: CHF (congestive heart failure) No date: Hypertension No date: S/P CABG x 3  History reviewed. No pertinent surgical history.  BMI    Body Mass Index: 24.54 kg/m      Reproductive/Obstetrics negative OB ROS                             Anesthesia Physical Anesthesia Plan  ASA: 3 and emergent  Anesthesia Plan: General ETT   Post-op Pain Management:    Induction: Intravenous and Rapid sequence  PONV Risk Score and Plan: Midazolam, Dexamethasone and Ondansetron  Airway Management Planned: Oral ETT  Additional Equipment:   Intra-op Plan:   Post-operative Plan: Extubation in OR  Informed Consent: I have reviewed the patients History and Physical, chart, labs and discussed the procedure including the risks, benefits and alternatives for the proposed anesthesia with the patient or authorized representative who has indicated his/her understanding and acceptance.     Dental Advisory Given  Plan Discussed with: Anesthesiologist, CRNA and Surgeon  Anesthesia Plan Comments: (Patient consented for risks of anesthesia including but not limited to:  - adverse  reactions to medications - damage to eyes, teeth, lips or other oral mucosa - nerve damage due to positioning  - sore throat or hoarseness - Damage to heart, brain, nerves, lungs, other parts of body or loss of life  Patient voiced understanding.)        Anesthesia Quick Evaluation

## 2022-12-03 NOTE — Interval H&P Note (Signed)
History and Physical Interval Note: Consult note from 12/02/2022  s reviewed and there was no interval change after seeing and examining the patient.  Hemoglobin has remained stable at 9.2, INR 2.0. He is over 30 hours removed from his last eliquis dose. Vital signs stable. No epistaxis o/n. Npo since midnight.  Written consent was obtained from the patient after discussion of risks, benefits, and alternatives. Patient has consented to proceed with Esophagogastroduodenoscopy with possible intervention  Esophagogastroduodenoscopy with possible biopsy, control of bleeding, polypectomy, and interventions as necessary has been discussed with the patient/patient representative. Informed consent was obtained from the patient/patient representative after explaining the indication, nature, and risks of the procedure including but not limited to death, bleeding, perforation, missed neoplasm/lesions, cardiorespiratory compromise, and reaction to medications. Opportunity for questions was given and appropriate answers were provided. Patient/patient representative has verbalized understanding is amenable to undergoing the procedure.  12/03/2022 12:21 PM  Kenneth Stevenson  has presented today for surgery, with the diagnosis of melena, anemia, cirrhosis.  The various methods of treatment have been discussed with the patient and family. After consideration of risks, benefits and other options for treatment, the patient has consented to  Procedure(s): ESOPHAGOGASTRODUODENOSCOPY (EGD) WITH PROPOFOL (N/A) as a surgical intervention.  The patient's history has been reviewed, patient examined, no change in status, stable for surgery.  I have reviewed the patient's chart and labs.  Questions were answered to the patient's satisfaction.     Jaynie Collins

## 2022-12-03 NOTE — Op Note (Signed)
[facility] Gastroenterology Patient Name: Kenneth Stevenson Procedure Date: 12/03/2022 12:51 PM MRN: [id number] Account #: [id number] Date of Birth: [birth year] Admit Type: Inpatient Age: [age] Room: [facility] ENDO ROOM 3 Gender: Male Note Status: Finalized Instrument Name: Upper Endoscope [id number] Procedure:             Upper GI endoscopy Indications:           Melena Providers:             Trenda MootsSteven Michael Chariah Bailey DO, DO Referring MD:          [facility] (Referring MD) Medicines:             Monitored Anesthesia Care Complications:         No immediate complications. Estimated blood loss: None. Procedure:             Pre-Anesthesia Assessment:                        - Prior to the procedure, a History and Physical was                         performed, and patient medications and allergies were                         reviewed. The patient is competent. The risks and                         benefits of the procedure and the sedation options and                         risks were discussed with the patient. All questions                         were answered and informed consent was obtained.                         Patient identification and proposed procedure were                         verified by the physician, the nurse, the anesthetist                         and the technician in the endoscopy suite. Mental                         Status Examination: alert and oriented. Airway                         Examination: normal oropharyngeal airway and neck                         mobility. Respiratory Examination: poor air movement.                         CV Examination: irregularly irregular rate and rhythm,                         tachycardia noted and systolic murmur. Prophylactic  Antibiotics: The patient requires prophylactic                         antibiotics due to a prior history of cirrhosis and                         upper  gastrointestinal bleed in setting of cirrhosis.                         Prior Anticoagulants: The patient has taken Eliquis                         (apixaban), last dose was 1 day prior to procedure.                         ASA Grade Assessment: III - A patient with severe                         systemic disease. After reviewing the risks and                         benefits, the patient was deemed in satisfactory                         condition to undergo the procedure. The anesthesia                         plan was to use general anesthesia. Immediately prior                         to administration of medications, the patient was                         re-assessed for adequacy to receive sedatives. The                         heart rate, respiratory rate, oxygen saturations,                         blood pressure, adequacy of pulmonary ventilation, and                         response to care were monitored throughout the                         procedure. The physical status of the patient was                         re-assessed after the procedure.                        After obtaining informed consent, the endoscope was                         passed under direct vision. Throughout the procedure,                         the patient's blood pressure, pulse, and oxygen  saturations were monitored continuously. The Endoscope                         was introduced through the mouth, and advanced to the                         second part of duodenum. The upper GI endoscopy was                         accomplished without difficulty. The patient tolerated                         the procedure well. Findings:      The duodenal bulb, first portion of the duodenum and second portion of       the duodenum were normal. Estimated blood loss: none.      Mild portal hypertensive gastropathy was found in the gastric body.       Estimated blood loss: none.      The  exam of the stomach was otherwise normal.      There were esophageal mucosal changes classified as Barrett's stage       C1-M3 per Prague criteria present in the lower third of the esophagus.       The maximum longitudinal extent of these mucosal changes was 3 cm in       length. Estimated blood loss: none. Previous egd had acquired biopsies       per notation. Given bleeding, biopsies not taken as to not confuse       clinical picture.      Esophagogastric landmarks were identified: the Z-line was found at 38 cm       and the gastroesophageal junction was found at 41 cm from the incisors.      The exam of the esophagus was otherwise normal.      No blood, fresh or old, seen with in the esophageal, gastric, or       duodenal lumen Impression:            - Normal duodenal bulb, first portion of the duodenum                         and second portion of the duodenum.                        - Portal hypertensive gastropathy.                        - Esophageal mucosal changes classified as Barrett's                         stage C1-M3 per Prague criteria.                        - Esophagogastric landmarks identified.                        - No specimens collected. Recommendation:        - Return patient to hospital ward for ongoing care.                        - Low sodium diet.                        -  Continue present medications.                        - Can discontinue octreotide gtt and rocephin                        Can transition back to home dose (once daily ppi) and                         discontinue iv                        Consider ENT consult for intervention prior to                         resuming eliquis as his persistent epistaxis is likely                         his source of melena                        - Return to primary gastroenterologist/hepatologist at                         the [facility] as previously scheduled.                        - The findings and  recommendations were discussed with                         the patient.                        - The findings and recommendations were discussed with                         the patient's primary physician. Procedure Code(s):     --- Professional ---                        [phone], Esophagogastroduodenoscopy, flexible,                         transoral; diagnostic, including collection of                         specimen(s) by brushing or washing, when performed                         (separate procedure) Diagnosis Code(s):     --- Professional ---                        K22.70, Barrett's esophagus without dysplasia                        K76.6, Portal hypertension                        K31.89, Other diseases of stomach and duodenum                        K92.1, Melena (includes Hematochezia) CPT copyright  2022 American Medical Association. All rights reserved. The codes documented in this report are preliminary and upon coder review may  be revised to meet current compliance requirements. Attending Participation:      I personally performed the entire procedure. Elfredia Nevins, DO Jaynie Collins DO, DO 12/03/2022 1:29:18 PM This report has been signed electronically. Number of Addenda: 0 Note Initiated On: 12/03/2022 12:51 PM Estimated Blood Loss:  Estimated blood loss: none.      [facility]

## 2022-12-03 NOTE — Transfer of Care (Addendum)
Immediate Anesthesia Transfer of Care Note  Patient: Kenneth Stevenson  Procedure(s) Performed: ESOPHAGOGASTRODUODENOSCOPY (EGD) WITH PROPOFOL  Patient Location: PACU  Anesthesia Type:General  Level of Consciousness: awake and alert   Airway & Oxygen Therapy: Patient Spontanous Breathing  Post-op Assessment: Report given to RN and Post -op Vital signs reviewed and stable Bp on the low side. Talking with pt and giving fluid bolus to treat low bp.  Post vital signs: stable  Last Vitals:  Vitals Value Taken Time  BP 73/53 12/03/22 1316  Temp 36.4 C 12/03/22 1316  Pulse 92 12/03/22 1316  Resp 17 12/03/22 1316  SpO2 94 % 12/03/22 1316  Vitals shown include unvalidated device data.  Last Pain:  Vitals:   12/03/22 1316  TempSrc: Temporal  PainSc: 0-No pain         Complications: No notable events documented.

## 2022-12-03 NOTE — Anesthesia Procedure Notes (Signed)
Procedure Name: Intubation Date/Time: 12/03/2022 1:04 PM  Performed by: Maryla Morrow., CRNAPre-anesthesia Checklist: Patient identified, Patient being monitored, Timeout performed, Emergency Drugs available and Suction available Patient Re-evaluated:Patient Re-evaluated prior to induction Oxygen Delivery Method: Circle system utilized Preoxygenation: Pre-oxygenation with 100% oxygen Induction Type: IV induction Ventilation: Mask ventilation without difficulty Laryngoscope Size: Mac and 3 Grade View: Grade I Tube type: Oral Tube size: 7.0 mm Number of attempts: 1 Airway Equipment and Method: Stylet Placement Confirmation: ETT inserted through vocal cords under direct vision, positive ETCO2 and breath sounds checked- equal and bilateral Secured at: 21 cm Tube secured with: Tape Dental Injury: Teeth and Oropharynx as per pre-operative assessment

## 2022-12-04 ENCOUNTER — Encounter: Payer: Self-pay | Admitting: Gastroenterology

## 2023-01-13 NOTE — Discharge Summary (Signed)
Physician Discharge Summary   Patient: Kenneth Stevenson MRN: [id number] DOB: 06-27-1946  Admit date:     12/02/2022  Discharge date: 12/03/2022  Discharge Physician: Delfino Lovett   [facility]   Recommendations at discharge:    F/up with outpt providers as requested  Discharge Diagnoses: Principal Problem:   Acute on chronic heart failure [facility]) Active Problems:   Melena   Epistaxis   Alcohol abuse   Alcoholic cirrhosis (HCC)   CAD (coronary artery disease)   Atrial fibrillation, chronic (HCC)   Essential hypertension   COPD (chronic obstructive pulmonary disease) [facility])  Hospital Course: Assessment and Plan: * Acute on chronic heart failure (HCC) Patient is presenting with chronic shortness of breath with acute worsening in addition to bilateral lower extremity edema.  Patient notes that he has a history of CHF, however unable to see prior documentation or echocardiogram in [facility].  - he was diuresed with Lasix while in the Hospital and seems close to baseline. Net IO Since Admission: -600 mL [01/13/23 1647]   Melena Patient reports one-time episode of melena Likely secondary to epistaxis, however he is high risk given history of cirrhosis and portal gastropathy on anti-coagulation.  Hemoglobin stable at this time.  - Eliquis held while in the Hospital - Treated with octreotide infusion & Protonix 40 mg IV BID - seen by GI & status post EGD.  Only findings were known mild portal hypertensive gastropathy and Barrett's esophagus.  No sign of esophageal or gastric varices.  No signs of GAVE.  No fresh or old blood within the gastric duodenal or esophageal lumen. - MELD 3.0 (24) - patient tolerated diet with no further bleed. GI recommended to discontinue Rocephin and octreotide - Continue PPI at Discharge - Avoid NSAIDs - Consider Outpt ENT involvement before restarting Eliquis for possible intervention as his bleeding and melena are likely related to his  epistaxis.  Epistaxis Potentially multifactorial in the setting of Eliquis use and elevated INR secondary to cirrhosis.  Resolved at this time, however high risk for recurrence - home Eliquis held while in the Hospital - prn Afrin and outpt eval by ENT if epistaxis recurs and is persistent  Alcohol abuse Patient states he continues to drink daily at least 2 beers and then secretly drinks vodka as well.   - counseled on complete etoh cessation  Alcoholic cirrhosis (HCC) History of compensated cirrhosis in the setting of alcohol use disorder that is active.  No prior decompensation including encephalopathy or ascites.  He follows with GI at the [facility]  CAD (coronary artery disease) History of extensive CAD s/p CABG and multiple stents.  Last stent was in 2014.   - Continue home beta-blocker  Atrial fibrillation, chronic (HCC) - Holding home Eliquis - Continue home beta-blocker  Essential hypertension - Continue home antihypertensives  COPD (chronic obstructive pulmonary disease) (HCC) No wheezing on examination today.  Shortness of breath in the setting of heart failure, not COPD.  -Continue home bronchodilators  He was adamant in leaving and family was on board with the D/C plans. He was requested to f/up with his PCP at [facility].       Consultants: GI Procedures performed: egd  Disposition: Home Diet recommendation:  Discharge Diet Orders (From admission, onward)     Start     Ordered   12/03/22 0000  Diet - low sodium heart healthy        12/03/22 1325  Carb modified diet DISCHARGE MEDICATION: Allergies as of 12/03/2022       Reactions   Simvastatin    Empagliflozin Other (See Comments)   Other reaction(s): Tremor, Disorientated, Increased frequency of urination        Medication List     STOP taking these medications    amLODipine 5 MG tablet Commonly known as: NORVASC   aspirin 81 MG tablet   clopidogrel 75 MG tablet Commonly known as:  PLAVIX   levETIRAcetam 500 MG tablet Commonly known as: Keppra   lisinopril 40 MG tablet Commonly known as: ZESTRIL   magnesium (amino acid chelate) 133 MG tablet   metoprolol 200 MG 24 hr tablet Commonly known as: TOPROL-XL   ondansetron 4 MG disintegrating tablet Commonly known as: ZOFRAN-ODT   prednisoLONE acetate 1 % ophthalmic suspension Commonly known as: PRED FORTE   triamcinolone ointment 0.5 % Commonly known as: KENALOG       TAKE these medications    albuterol 108 (90 Base) MCG/ACT inhaler Commonly known as: VENTOLIN HFA Inhale 1 puff into the lungs every 6 (six) hours as needed for wheezing or shortness of breath.   alendronate 70 MG tablet Commonly known as: FOSAMAX Take 70 mg by mouth once a week.   allopurinol 100 MG tablet Commonly known as: ZYLOPRIM Take 200 mg by mouth daily.   atorvastatin 80 MG tablet Commonly known as: LIPITOR Take 40 mg by mouth daily.   carvedilol 12.5 MG tablet Commonly known as: COREG Take 12.5 mg by mouth 2 (two) times daily with a meal.   cetirizine 10 MG tablet Commonly known as: ZYRTEC TAKE ONE TABLET BY MOUTH EVERY DAY AS NEEDED FOR ITCHINESS   colchicine 0.6 MG tablet 2 tablets stat, then 1 tablet tid until relief or stomach upset   cyanocobalamin 1000 MCG tablet Commonly known as: VITAMIN B12 Take 1,000 mcg by mouth daily. What changed: Another medication with the same name was removed. Continue taking this medication, and follow the directions you see here.   diclofenac Sodium 1 % Gel Commonly known as: VOLTAREN APPLY 4 GRAMS TOPICALLY EVERY 6 HOURS FOR BACK PAIN   Eliquis 5 MG Tabs tablet Generic drug: apixaban Take 5 mg by mouth 2 (two) times daily.   furosemide 20 MG tablet Commonly known as: Lasix Take 1 tablet (20 mg total) by mouth daily.   magnesium oxide 400 MG tablet Commonly known as: MAG-OX Take 400 mg by mouth 2 (two) times daily.   melatonin 3 MG Tabs tablet Take 3 mg by mouth at  bedtime as needed (sleep).   omeprazole 20 MG capsule Commonly known as: PRILOSEC Take 20 mg by mouth 2 (two) times daily before a meal.   oxymetazoline 0.05 % nasal spray Commonly known as: AFRIN Place 2 sprays into both nostrils as directed.   pyridOXINE 100 MG tablet Commonly known as: VITAMIN B6 Take 100 mg by mouth daily.   Stiolto Respimat 2.5-2.5 MCG/ACT Aers Generic drug: Tiotropium Bromide-Olodaterol Inhale 2 each into the lungs daily.        Discharge Exam: Filed Weights   12/02/22 1225  Weight: 77.6 kg   76 Y m sitting in the bed in no acute distress Constitutional:      General: He is not in acute distress.    Appearance: He is ill-appearing (Chronically).  HENT:     Head: Normocephalic and atraumatic.     Nose:     Comments: Some dried blood in bilateral nares, right more  than left but no evidence of active bleed     Mouth: Mucous membranes are moist.     Pharynx: Oropharynx is clear.  Eyes:     Pupils: Pupils are equal, round, and reactive to light.  Cardiovascular:     Rate and Rhythm: Normal rate. Irregular rhythm.     Heart sounds: No murmur heard.    No gallop.  Pulmonary:     Effort: Pulmonary effort is normal. No respiratory distress.     Breath sounds: CTA b/l, No wheezing or rhonchi.  Abdominal:     General: Bowel sounds are normal. There is no distension.     Palpations: Abdomen is soft.     Tenderness: There is no abdominal tenderness. There is no guarding.  Musculoskeletal:    Trace Pitting edema b/l le  Skin:    General: Skin is warm and dry.     Comments: Facial telangiectasia noted  Neurological:     General: No focal deficit present.     Mental Status: He is alert and oriented to person, place, and time. Mental status is at baseline.  Psychiatric:        Mood and Affect: Mood normal.        Behavior: Behavior normal.   Condition at discharge: good  The results of significant diagnostics from this hospitalization (including  imaging, microbiology, ancillary and laboratory) are listed below for reference.   Imaging Studies: No results found.  Microbiology: Results for orders placed or performed during the hospital encounter of 08/29/22  Resp panel by RT-PCR (RSV, Flu A&B, Covid) Anterior Nasal Swab     Status: None   Collection Time: 08/29/22  6:35 AM   Specimen: Anterior Nasal Swab  Result Value Ref Range Status   SARS Coronavirus 2 by RT PCR NEGATIVE NEGATIVE Final    Comment: (NOTE) SARS-CoV-2 target nucleic acids are NOT DETECTED.  The SARS-CoV-2 RNA is generally detectable in upper respiratory specimens during the acute phase of infection. The lowest concentration of SARS-CoV-2 viral copies this assay can detect is 138 copies/mL. A negative result does not preclude SARS-Cov-2 infection and should not be used as the sole basis for treatment or other patient management decisions. A negative result may occur with  improper specimen collection/handling, submission of specimen other than nasopharyngeal swab, presence of viral mutation(s) within the areas targeted by this assay, and inadequate number of viral copies(<138 copies/mL). A negative result must be combined with clinical observations, patient history, and epidemiological information. The expected result is Negative.  Fact Sheet for Patients:  BloggerCourse.com  Fact Sheet for Healthcare Providers:  SeriousBroker.it  This test is no t yet approved or cleared by the [place] FDA and  has been authorized for detection and/or diagnosis of SARS-CoV-2 by FDA under an Emergency Use Authorization (EUA). This EUA will remain  in effect (meaning this test can be used) for the duration of the COVID-19 declaration under Section 564(b)(1) of the Act, 21 U.S.C.section 360bbb-3(b)(1), unless the authorization is terminated  or revoked sooner.       Influenza A by PCR NEGATIVE NEGATIVE Final    Influenza B by PCR NEGATIVE NEGATIVE Final    Comment: (NOTE) The Xpert Xpress SARS-CoV-2/FLU/RSV plus assay is intended as an aid in the diagnosis of influenza from Nasopharyngeal swab specimens and should not be used as a sole basis for treatment. Nasal washings and aspirates are unacceptable for Xpert Xpress SARS-CoV-2/FLU/RSV testing.  Fact Sheet for Patients: BloggerCourse.com  Fact Sheet for  Healthcare Providers: SeriousBroker.it  This test is not yet approved or cleared by the [place] and has been authorized for detection and/or diagnosis of SARS-CoV-2 by FDA under an Emergency Use Authorization (EUA). This EUA will remain in effect (meaning this test can be used) for the duration of the COVID-19 declaration under Section 564(b)(1) of the Act, 21 U.S.C. section 360bbb-3(b)(1), unless the authorization is terminated or revoked.     Resp Syncytial Virus by PCR NEGATIVE NEGATIVE Final    Comment: (NOTE) Fact Sheet for Patients: BloggerCourse.com  Fact Sheet for Healthcare Providers: SeriousBroker.it  This test is not yet approved or cleared by the [place] FDA and has been authorized for detection and/or diagnosis of SARS-CoV-2 by FDA under an Emergency Use Authorization (EUA). This EUA will remain in effect (meaning this test can be used) for the duration of the COVID-19 declaration under Section 564(b)(1) of the Act, 21 U.S.C. section 360bbb-3(b)(1), unless the authorization is terminated or revoked.  Performed at [facility], [address] [place], [place] [address]   Blood culture (routine single)     Status: None   Collection Time: 08/29/22  7:37 AM   Specimen: BLOOD  Result Value Ref Range Status   Specimen Description BLOOD  LEFT [facility]  Final   Special Requests   Final    BOTTLES DRAWN AEROBIC AND ANAEROBIC Blood Culture adequate volume    Culture   Final    NO GROWTH 5 DAYS Performed at [facility], [address] [place], [place] [address]    Report Status 09/03/2022 FINAL  Final  Urine Culture     Status: None   Collection Time: 08/29/22  9:06 AM   Specimen: In/Out Cath Urine  Result Value Ref Range Status   Specimen Description   Final    IN/OUT CATH URINE Performed at [facility], [address], [place], [place] [address]    Special Requests   Final    NONE Performed at [facility], [address] [place], [place] [address]    Culture   Final    NO GROWTH Performed at [facility], [address], [place], [place] [address]    Report Status 08/30/2022 FINAL  Final    Labs: CBC: No results for input(s): "WBC", "NEUTROABS", "HGB", "HCT", "MCV", "PLT" in the last 168 hours. Basic Metabolic Panel: No results for input(s): "NA", "K", "CL", "CO2", "GLUCOSE", "BUN", "CREATININE", "CALCIUM", "MG", "PHOS" in the last 168 hours. Liver Function Tests: No results for input(s): "AST", "ALT", "ALKPHOS", "BILITOT", "PROT", "ALBUMIN" in the last 168 hours. CBG: No results for input(s): "GLUCAP" in the last 168 hours.  Discharge time spent: greater than 30 minutes.  Signed: Delfino Lovett, MD [facility] 01/13/2023

## 2023-01-21 ENCOUNTER — Emergency Department: Payer: No Typology Code available for payment source

## 2023-01-21 ENCOUNTER — Other Ambulatory Visit: Payer: Self-pay

## 2023-01-21 ENCOUNTER — Encounter: Payer: Self-pay | Admitting: Internal Medicine

## 2023-01-21 ENCOUNTER — Inpatient Hospital Stay
Admission: EM | Admit: 2023-01-21 | Discharge: 2023-01-25 | DRG: 640 | Disposition: A | Discharge status: Home / Self Care | Payer: No Typology Code available for payment source | Attending: Internal Medicine | Admitting: Internal Medicine

## 2023-01-21 DIAGNOSIS — K7030 Alcoholic cirrhosis of liver without ascites: Secondary | ICD-10-CM | POA: Diagnosis present

## 2023-01-21 DIAGNOSIS — Z7901 Long term (current) use of anticoagulants: Secondary | ICD-10-CM | POA: Diagnosis not present

## 2023-01-21 DIAGNOSIS — I2510 Atherosclerotic heart disease of native coronary artery without angina pectoris: Secondary | ICD-10-CM | POA: Diagnosis present

## 2023-01-21 DIAGNOSIS — D539 Nutritional anemia, unspecified: Secondary | ICD-10-CM | POA: Diagnosis present

## 2023-01-21 DIAGNOSIS — E876 Hypokalemia: Secondary | ICD-10-CM | POA: Diagnosis not present

## 2023-01-21 DIAGNOSIS — Z7982 Long term (current) use of aspirin: Secondary | ICD-10-CM

## 2023-01-21 DIAGNOSIS — K7031 Alcoholic cirrhosis of liver with ascites: Secondary | ICD-10-CM | POA: Diagnosis present

## 2023-01-21 DIAGNOSIS — I482 Chronic atrial fibrillation, unspecified: Secondary | ICD-10-CM | POA: Diagnosis present

## 2023-01-21 DIAGNOSIS — I420 Dilated cardiomyopathy: Secondary | ICD-10-CM | POA: Diagnosis present

## 2023-01-21 DIAGNOSIS — I509 Heart failure, unspecified: Secondary | ICD-10-CM

## 2023-01-21 DIAGNOSIS — F1010 Alcohol abuse, uncomplicated: Secondary | ICD-10-CM | POA: Diagnosis present

## 2023-01-21 DIAGNOSIS — Z79899 Other long term (current) drug therapy: Secondary | ICD-10-CM | POA: Diagnosis not present

## 2023-01-21 DIAGNOSIS — E861 Hypovolemia: Secondary | ICD-10-CM | POA: Diagnosis present

## 2023-01-21 DIAGNOSIS — M542 Cervicalgia: Secondary | ICD-10-CM | POA: Diagnosis present

## 2023-01-21 DIAGNOSIS — I5031 Acute diastolic (congestive) heart failure: Secondary | ICD-10-CM | POA: Diagnosis present

## 2023-01-21 DIAGNOSIS — Z888 Allergy status to other drugs, medicaments and biological substances status: Secondary | ICD-10-CM | POA: Diagnosis not present

## 2023-01-21 DIAGNOSIS — E871 Hypo-osmolality and hyponatremia: Principal | ICD-10-CM | POA: Diagnosis present

## 2023-01-21 DIAGNOSIS — K219 Gastro-esophageal reflux disease without esophagitis: Secondary | ICD-10-CM | POA: Diagnosis present

## 2023-01-21 DIAGNOSIS — Z951 Presence of aortocoronary bypass graft: Secondary | ICD-10-CM

## 2023-01-21 DIAGNOSIS — D696 Thrombocytopenia, unspecified: Secondary | ICD-10-CM | POA: Diagnosis present

## 2023-01-21 DIAGNOSIS — J449 Chronic obstructive pulmonary disease, unspecified: Secondary | ICD-10-CM | POA: Diagnosis present

## 2023-01-21 DIAGNOSIS — M109 Gout, unspecified: Secondary | ICD-10-CM | POA: Diagnosis present

## 2023-01-21 DIAGNOSIS — Z91199 Patient's noncompliance with other medical treatment and regimen due to unspecified reason: Secondary | ICD-10-CM

## 2023-01-21 DIAGNOSIS — I110 Hypertensive heart disease with heart failure: Secondary | ICD-10-CM | POA: Diagnosis present

## 2023-01-21 DIAGNOSIS — Y92239 Unspecified place in hospital as the place of occurrence of the external cause: Secondary | ICD-10-CM | POA: Diagnosis not present

## 2023-01-21 DIAGNOSIS — T501X5A Adverse effect of loop [high-ceiling] diuretics, initial encounter: Secondary | ICD-10-CM | POA: Diagnosis not present

## 2023-01-21 DIAGNOSIS — K2270 Barrett's esophagus without dysplasia: Secondary | ICD-10-CM | POA: Diagnosis present

## 2023-01-21 DIAGNOSIS — I10 Essential (primary) hypertension: Secondary | ICD-10-CM | POA: Diagnosis present

## 2023-01-21 DIAGNOSIS — Z7983 Long term (current) use of bisphosphonates: Secondary | ICD-10-CM

## 2023-01-21 DIAGNOSIS — E785 Hyperlipidemia, unspecified: Secondary | ICD-10-CM | POA: Diagnosis present

## 2023-01-21 DIAGNOSIS — I5033 Acute on chronic diastolic (congestive) heart failure: Secondary | ICD-10-CM | POA: Diagnosis not present

## 2023-01-21 LAB — CBC
HCT: 23.9 % — ABNORMAL LOW (ref 39.0–52.0)
Hemoglobin: 8 g/dL — ABNORMAL LOW (ref 13.0–17.0)
MCH: 35.1 pg — ABNORMAL HIGH (ref 26.0–34.0)
MCHC: 33.5 g/dL (ref 30.0–36.0)
MCV: 104.8 fL — ABNORMAL HIGH (ref 80.0–100.0)
Platelets: 77 10*3/uL — ABNORMAL LOW (ref 150–400)
RBC: 2.28 MIL/uL — ABNORMAL LOW (ref 4.22–5.81)
RDW: 16.8 % — ABNORMAL HIGH (ref 11.5–15.5)
WBC: 4 10*3/uL (ref 4.0–10.5)
nRBC: 0 % (ref 0.0–0.2)

## 2023-01-21 LAB — BASIC METABOLIC PANEL
Anion gap: 7 (ref 5–15)
Anion gap: 7 (ref 5–15)
BUN: 12 mg/dL (ref 8–23)
BUN: 12 mg/dL (ref 8–23)
CO2: 21 mmol/L — ABNORMAL LOW (ref 22–32)
CO2: 22 mmol/L (ref 22–32)
Calcium: 8.4 mg/dL — ABNORMAL LOW (ref 8.9–10.3)
Calcium: 8.5 mg/dL — ABNORMAL LOW (ref 8.9–10.3)
Chloride: 90 mmol/L — ABNORMAL LOW (ref 98–111)
Chloride: 89 mmol/L — ABNORMAL LOW (ref 98–111)
Creatinine, Ser: 0.81 mg/dL (ref 0.61–1.24)
Creatinine, Ser: 0.76 mg/dL (ref 0.61–1.24)
GFR, Estimated: >60 mL/min (ref >60)
GFR, Estimated: >60 mL/min (ref >60)
Glucose, Bld: 133 mg/dL — ABNORMAL HIGH (ref 70–99)
Glucose, Bld: 113 mg/dL — ABNORMAL HIGH (ref 70–99)
Potassium: 4.5 mmol/L (ref 3.5–5.1)
Potassium: 4.5 mmol/L (ref 3.5–5.1)
Sodium: 118 mmol/L — CL (ref 135–145)
Sodium: 118 mmol/L — CL (ref 135–145)

## 2023-01-21 LAB — HEPATIC FUNCTION PANEL
ALT: 16 U/L (ref 0–44)
AST: 31 U/L (ref 15–41)
Albumin: 3 g/dL — ABNORMAL LOW (ref 3.5–5.0)
Alkaline Phosphatase: 131 U/L — ABNORMAL HIGH (ref 38–126)
Bilirubin, Direct: 1.1 mg/dL — ABNORMAL HIGH (ref 0.0–0.2)
Indirect Bilirubin: 2 mg/dL — ABNORMAL HIGH (ref 0.3–0.9)
Total Bilirubin: 3.1 mg/dL — ABNORMAL HIGH (ref 0.3–1.2)
Total Protein: 7 g/dL (ref 6.5–8.1)

## 2023-01-21 LAB — MAGNESIUM: Magnesium: 1.6 mg/dL — ABNORMAL LOW (ref 1.7–2.4)

## 2023-01-21 LAB — BRAIN NATRIURETIC PEPTIDE: B Natriuretic Peptide: 559 pg/mL — ABNORMAL HIGH (ref 0.0–100.0)

## 2023-01-21 LAB — TROPONIN I (HIGH SENSITIVITY)
Troponin I (High Sensitivity): 13 ng/L (ref <18)
Troponin I (High Sensitivity): 12 ng/L (ref <18)

## 2023-01-21 LAB — AMMONIA: Ammonia: 84 umol/L — ABNORMAL HIGH (ref 9–35)

## 2023-01-21 LAB — PROTIME-INR
INR: 2.7 — ABNORMAL HIGH (ref 0.8–1.2)
Prothrombin Time: 28.6 seconds — ABNORMAL HIGH (ref 11.4–15.2)

## 2023-01-21 LAB — PHOSPHORUS: Phosphorus: 2.5 mg/dL (ref 2.5–4.6)

## 2023-01-21 LAB — APTT: aPTT: 74 seconds — ABNORMAL HIGH (ref 24–36)

## 2023-01-21 MED ORDER — FOLIC ACID 1 MG PO TABS
1.0000 mg | ORAL_TABLET | Freq: Every day | ORAL | Status: DC
  Administered 2023-01-21 – 2023-01-25 (×5): 1 mg via ORAL
  Filled 2023-01-21 (×5): qty 1

## 2023-01-21 MED ORDER — PANTOPRAZOLE SODIUM 40 MG PO TBEC
40.0000 mg | DELAYED_RELEASE_TABLET | Freq: Two times a day (BID) | ORAL | Status: DC
  Administered 2023-01-22 – 2023-01-25 (×7): 40 mg via ORAL
  Filled 2023-01-21 (×8): qty 1

## 2023-01-21 MED ORDER — ALBUTEROL SULFATE (2.5 MG/3ML) 0.083% IN NEBU: 3.0000 mL | INHALATION_SOLUTION | RESPIRATORY_TRACT | Status: DC | PRN

## 2023-01-21 MED ORDER — ALLOPURINOL 100 MG PO TABS
200.0000 mg | ORAL_TABLET | Freq: Every day | ORAL | Status: DC
  Administered 2023-01-22 – 2023-01-25 (×4): 200 mg via ORAL
  Filled 2023-01-21 (×4): qty 2

## 2023-01-21 MED ORDER — VITAMIN B-12 100 MCG PO TABS
100.0000 ug | ORAL_TABLET | Freq: Every day | ORAL | Status: DC
  Administered 2023-01-22 – 2023-01-25 (×4): 100 ug via ORAL
  Filled 2023-01-21 (×4): qty 1

## 2023-01-21 MED ORDER — MAGNESIUM OXIDE 400 MG PO TABS
400.0000 mg | ORAL_TABLET | Freq: Two times a day (BID) | ORAL | Status: DC
  Administered 2023-01-21 – 2023-01-25 (×8): 400 mg via ORAL
  Filled 2023-01-21 (×13): qty 1

## 2023-01-21 MED ORDER — DM-GUAIFENESIN ER 30-600 MG PO TB12: 1.0000 | ORAL_TABLET | Freq: Two times a day (BID) | ORAL | Status: DC | PRN

## 2023-01-21 MED ORDER — THIAMINE HCL 100 MG/ML IJ SOLN
100.0000 mg | Freq: Every day | INTRAMUSCULAR | Status: DC
  Filled 2023-01-21: qty 2

## 2023-01-21 MED ORDER — ADULT MULTIVITAMIN W/MINERALS CH
1.0000 | ORAL_TABLET | Freq: Every day | ORAL | Status: DC
  Administered 2023-01-21 – 2023-01-25 (×5): 1 via ORAL
  Filled 2023-01-21 (×5): qty 1

## 2023-01-21 MED ORDER — ACETAMINOPHEN 325 MG PO TABS: 650.0000 mg | ORAL_TABLET | Freq: Four times a day (QID) | ORAL | Status: DC | PRN

## 2023-01-21 MED ORDER — LORAZEPAM 2 MG/ML IJ SOLN: 0.0000 mg | Freq: Two times a day (BID) | INTRAMUSCULAR | Status: DC

## 2023-01-21 MED ORDER — APIXABAN 5 MG PO TABS
5.0000 mg | ORAL_TABLET | Freq: Two times a day (BID) | ORAL | Status: DC
  Administered 2023-01-22 – 2023-01-25 (×7): 5 mg via ORAL
  Filled 2023-01-21 (×8): qty 1

## 2023-01-21 MED ORDER — NALTREXONE HCL 50 MG PO TABS
50.0000 mg | ORAL_TABLET | Freq: Every day | ORAL | Status: DC
  Administered 2023-01-22 – 2023-01-25 (×4): 50 mg via ORAL
  Filled 2023-01-21 (×4): qty 1

## 2023-01-21 MED ORDER — DIPHENHYDRAMINE HCL 50 MG/ML IJ SOLN: 12.5000 mg | Freq: Three times a day (TID) | INTRAMUSCULAR | Status: DC | PRN

## 2023-01-21 MED ORDER — VITAMIN B-6 100 MG PO TABS
100.0000 mg | ORAL_TABLET | Freq: Every day | ORAL | Status: DC
  Administered 2023-01-22 – 2023-01-25 (×4): 100 mg via ORAL
  Filled 2023-01-21 (×4): qty 1

## 2023-01-21 MED ORDER — LORAZEPAM 2 MG/ML IJ SOLN: 0.0000 mg | Freq: Four times a day (QID) | INTRAMUSCULAR | Status: DC

## 2023-01-21 MED ORDER — FUROSEMIDE 10 MG/ML IJ SOLN
40.0000 mg | Freq: Once | INTRAMUSCULAR | Status: AC
  Administered 2023-01-21: 40 mg via INTRAVENOUS
  Filled 2023-01-21: qty 4

## 2023-01-21 MED ORDER — CARVEDILOL 12.5 MG PO TABS
12.5000 mg | ORAL_TABLET | Freq: Two times a day (BID) | ORAL | Status: DC
  Administered 2023-01-21 – 2023-01-25 (×7): 12.5 mg via ORAL
  Filled 2023-01-21 (×4): qty 1
  Filled 2023-01-21: qty 2
  Filled 2023-01-21: qty 1
  Filled 2023-01-21 (×2): qty 2

## 2023-01-21 MED ORDER — UMECLIDINIUM BROMIDE 62.5 MCG/ACT IN AEPB
1.0000 | INHALATION_SPRAY | Freq: Every day | RESPIRATORY_TRACT | Status: DC
  Administered 2023-01-22 – 2023-01-25 (×4): 1 via RESPIRATORY_TRACT
  Filled 2023-01-21 (×2): qty 7

## 2023-01-21 MED ORDER — LORAZEPAM 1 MG PO TABS: 1.0000 mg | ORAL_TABLET | ORAL | Status: DC | PRN

## 2023-01-21 MED ORDER — LORAZEPAM 1 MG PO TABS: 1.0000 mg | ORAL_TABLET | ORAL | Status: AC | PRN

## 2023-01-21 MED ORDER — LORAZEPAM 2 MG/ML IJ SOLN: 1.0000 mg | INTRAMUSCULAR | Status: AC | PRN

## 2023-01-21 MED ORDER — MAGNESIUM SULFATE 2 GM/50ML IV SOLN
2.0000 g | Freq: Once | INTRAVENOUS | Status: AC
  Administered 2023-01-21: 2 g via INTRAVENOUS
  Filled 2023-01-21: qty 50

## 2023-01-21 MED ORDER — LACTULOSE 10 GM/15ML PO SOLN
20.0000 g | Freq: Two times a day (BID) | ORAL | Status: DC
  Administered 2023-01-21 – 2023-01-25 (×8): 20 g via ORAL
  Filled 2023-01-21 (×8): qty 30

## 2023-01-21 MED ORDER — THIAMINE MONONITRATE 100 MG PO TABS
100.0000 mg | ORAL_TABLET | Freq: Every day | ORAL | Status: DC
  Administered 2023-01-21 – 2023-01-25 (×5): 100 mg via ORAL
  Filled 2023-01-21 (×5): qty 1

## 2023-01-21 MED ORDER — ARFORMOTEROL TARTRATE 15 MCG/2ML IN NEBU
15.0000 ug | INHALATION_SOLUTION | Freq: Two times a day (BID) | RESPIRATORY_TRACT | Status: DC
  Administered 2023-01-22 – 2023-01-25 (×7): 15 ug via RESPIRATORY_TRACT
  Filled 2023-01-21 (×8): qty 2

## 2023-01-21 MED ORDER — SODIUM CHLORIDE 1 G PO TABS
1.0000 g | ORAL_TABLET | Freq: Two times a day (BID) | ORAL | Status: DC
  Administered 2023-01-21: 1 g via ORAL
  Filled 2023-01-21: qty 1

## 2023-01-21 MED ORDER — HYDRALAZINE HCL 20 MG/ML IJ SOLN: 5.0000 mg | INTRAMUSCULAR | Status: DC | PRN

## 2023-01-21 NOTE — ED Triage Notes (Signed)
Pt to ED for chest tightness and shob for the past 3 days. Also reports hypotension.  Hx a fib, CHF, cirrhosis of liver, DT. Last ETOH yesterday Placed on 2 L [place] to maintain SpO2 >92% Daughter POA [phone]

## 2023-01-21 NOTE — ED Provider Triage Note (Signed)
Emergency Medicine Provider Triage Evaluation Note  Kenneth Stevenson , a 77 y.o. male  was evaluated in triage.  Pt complains of intermittent shortness of breath for a couple of days.  Denies recent illnesses, fever and abdominal pain. Pt reports he drinks daily, last alcoholic drink yesterday. He denies pain and reports he feels "congested".   Physical Exam  There were no vitals taken for this visit. Gen:   Awake, no distress. Abnormal color of skin, yellowish.  Resp:  Breathing mildly labored. Crackles bilaterally with auscultation.  MSK:   Moves extremities without difficulty  Other:  Sclera icterus, pitting edema 3+, diminished pulses of radial and pedal pulses.  Medical Decision Making  Medically screening exam initiated at 12:47 PM.  Appropriate orders placed.  Mitchael Barrozo was informed that the remainder of the evaluation will be completed by another provider, this initial triage assessment does not replace that evaluation, and the importance of remaining in the ED until their evaluation is complete.     Romeo Apple, [place] Decoursey A, PA-C 01/21/23 1257

## 2023-01-21 NOTE — H&P (Signed)
History and Physical    Kenneth Stevenson:[id number] DOB: Jan 02, 1946 DOA: 01/21/2023  Referring MD/NP/PA:   PCP: [facility]   Patient coming from:  The patient is coming from home.     Chief Complaint: Shortness of breath  HPI: Kenneth Stevenson is a 77 y.o. male with medical history significant of alcohol abuse, alcoholic liver cirrhosis, hypertension, hyperlipidemia, CAD, CABG, CHF (no 2D echo on record), atrial fibrillation on Eliquis, gout, GERD, thrombocytopenia, who presents with shortness of breath.  Patient was recently hospitalized from 4/9 - 4/20 due to CHF exacerbation.  Patient states he is not taking Lasix currently.  He developed shortness of breath which has been progressively worsening in the past several days.  Patient has cough with little mucus production.  He reports chest tightness, but denies active chest pain.  No fever or chills.  No nausea, vomiting, diarrhea or abdominal pain.  No symptoms of UTI.  Denies rectal bleeding.  He continues to drink alcohol, last drink was yesterday.  He states that he has been taking Eliquis consistently.  Patient is normally not using oxygen, but was found to have oxygen desaturation to 89% on room air, which improved to 99% on 2 L oxygen.  Data reviewed independently and ED Course: pt was found to have WBC 4.0, GFR> 60, sodium 118, Mg 1.6, temperature normal, blood pressure 123/68, heart rate is 32, 72, 56, RR 26.  Chest x-ray showed cardiomegaly and interstitial pulmonary edema.  Patient is admitted to PCU as inpatient.  Dr. Cherylann Ratel of renal is consulted.   EKG: I have personally reviewed.  Atrial fibrillation, QTc 509, RAD, nonspecific T wave change.   Review of Systems:   General: no fevers, chills, no body weight gain, has fatigue HEENT: no blurry vision, hearing changes or sore throat Respiratory: has dyspnea, coughing, no wheezing CV: no chest pain, no palpitations GI: no nausea, vomiting, abdominal pain,  diarrhea, constipation GU: no dysuria, burning on urination, increased urinary frequency, hematuria  Ext: has leg edema Neuro: no unilateral weakness, numbness, or tingling, no vision change or hearing loss Skin: no rash, no skin tear. MSK: No muscle spasm, no deformity, no limitation of range of movement in spin Heme: No easy bruising.  Travel history: No recent long distant travel.   Allergy:  Allergies  Allergen Reactions   Simvastatin    Empagliflozin Other (See Comments)    Other reaction(s): Tremor, Disorientated, Increased frequency of urination    Past Medical History:  Diagnosis Date   CHF (congestive heart failure) (HCC)    Hypertension    S/P CABG x 3     Past Surgical History:  Procedure Laterality Date   ESOPHAGOGASTRODUODENOSCOPY (EGD) WITH PROPOFOL N/A 12/03/2022   Procedure: ESOPHAGOGASTRODUODENOSCOPY (EGD) WITH PROPOFOL;  Surgeon: Jaynie Collins, DO;  Location: [facility] ENDOSCOPY;  Service: Gastroenterology;  Laterality: N/A;    Social History:  reports that he has never smoked. He has never used smokeless tobacco. He reports current alcohol use. He reports that he does not use drugs.  Family History:  Family History  Problem Relation Age of Onset   Dementia Sister      Prior to Admission medications   Medication Sig Start Date End Date Taking? Authorizing Provider  albuterol (PROVENTIL HFA;VENTOLIN HFA) 108 (90 Base) MCG/ACT inhaler Inhale 1 puff into the lungs every 6 (six) hours as needed for wheezing or shortness of breath.    [provider]  alendronate (FOSAMAX) 70 MG tablet Take 70 mg  by mouth once a week. 09/05/20   [provider]  allopurinol (ZYLOPRIM) 100 MG tablet Take 200 mg by mouth daily.    [provider]  amLODipine (NORVASC) 5 MG tablet Take 5 mg by mouth daily. Patient not taking: Reported on 12/02/2022    [provider]  apixaban (ELIQUIS) 5 MG TABS tablet Take 5 mg by mouth 2 (two) times daily.     [provider]  aspirin 81 MG tablet Take 81 mg by mouth daily. Patient not taking: Reported on 05/30/2022    [provider]  atorvastatin (LIPITOR) 80 MG tablet Take 40 mg by mouth daily.    [provider]  carvedilol (COREG) 12.5 MG tablet Take 12.5 mg by mouth 2 (two) times daily with a meal.    [provider]  cetirizine (ZYRTEC) 10 MG tablet TAKE ONE TABLET BY MOUTH EVERY DAY AS NEEDED FOR ITCHINESS 10/23/20   [provider]  clopidogrel (PLAVIX) 75 MG tablet Take 75 mg by mouth daily. Patient not taking: Reported on 12/02/2022    [provider]  colchicine 0.6 MG tablet 2 tablets stat, then 1 tablet tid until relief or stomach upset 09/13/16 05/30/22  Tommi Rumps, PA-C  cyanocobalamin 100 MCG tablet Take 1 tablet by mouth daily. Patient not taking: Reported on 12/02/2022 06/24/05   [provider]  diclofenac Sodium (VOLTAREN) 1 % GEL APPLY 4 GRAMS TOPICALLY EVERY 6 HOURS FOR BACK PAIN 10/23/20   [provider]  furosemide (LASIX) 20 MG tablet Take 1 tablet (20 mg total) by mouth daily. 12/03/22 01/02/23  Delfino Lovett, MD  levETIRAcetam (KEPPRA) 500 MG tablet Take 1 tablet (500 mg total) by mouth 2 (two) times daily. Patient not taking: Reported on 12/02/2022 08/31/22   Kirstie Peri, MD  lisinopril (PRINIVIL,ZESTRIL) 40 MG tablet Take 40 mg by mouth daily. Patient not taking: Reported on 12/02/2022    [provider]  magnesium oxide (MAG-OX) 400 MG tablet Take 400 mg by mouth 2 (two) times daily. 09/16/22   [provider]  melatonin 3 MG TABS tablet Take 3 mg by mouth at bedtime as needed (sleep).    [provider]  metoprolol (TOPROL-XL) 200 MG 24 hr tablet Take 200 mg by mouth daily. Patient not taking: Reported on 12/02/2022    [provider]  omeprazole (PRILOSEC) 20 MG capsule Take 20 mg by mouth 2 (two) times daily before a meal.    [provider]  ondansetron  (ZOFRAN-ODT) 4 MG disintegrating tablet Take 1 tablet (4 mg total) by mouth every 8 (eight) hours as needed for nausea or vomiting. Patient not taking: Reported on 12/02/2022 05/30/22   Sharman Cheek, MD  oxymetazoline (AFRIN) 0.05 % nasal spray Place 2 sprays into both nostrils as directed. 09/02/22   [provider]  prednisoLONE acetate (PRED FORTE) 1 % ophthalmic suspension INSTILL 1 DROP IN RIGHT EYE FOUR TIMES A DAY Patient not taking: Reported on 05/30/2022 06/17/21   [provider]  pyridOXINE (VITAMIN B-6) 100 MG tablet Take 100 mg by mouth daily.    [provider]  Specialty Vitamins Products (MAGNESIUM, AMINO ACID CHELATE,) 133 MG tablet Take 1 tablet by mouth 2 (two) times daily. Patient not taking: Reported on 12/02/2022    [provider]  Tiotropium Bromide-Olodaterol (STIOLTO RESPIMAT) 2.5-2.5 MCG/ACT AERS Inhale 2 each into the lungs daily.    [provider]  triamcinolone ointment (KENALOG) 0.5 % Apply 1 application topically 2 (  two) times daily. Patient not taking: Reported on 12/02/2022 09/13/16   Tommi Rumps, PA-C  vitamin B-12 (CYANOCOBALAMIN) 1000 MCG tablet Take 1,000 mcg by mouth daily.    [provider]    Physical Exam: Vitals:   01/21/23 1256 01/21/23 1418 01/21/23 1419 01/21/23 1430  BP:  104/61  123/68  Pulse:  (!) 31 64 72  Resp:   20 13  Temp:      SpO2: 96%  94% 99%  Weight:      Height:       General: Not in acute distress HEENT:       Eyes: PERRL, EOMI, no scleral icterus.       ENT: No discharge from the ears and nose, no pharynx injection, no tonsillar enlargement.        Neck: positive JVD, no bruit, no mass felt. Heme: No neck lymph node enlargement. Cardiac: S1/S2, RRR, No murmurs, No gallops or rubs. Respiratory: Fine crackles bilaterally GI: Soft, nondistended, nontender, no rebound pain, no organomegaly, BS present. GU: No hematuria Ext: trace leg edema bilaterally. 1+DP/PT pulse  bilaterally. Musculoskeletal: No joint deformities, No joint redness or warmth, no limitation of ROM in spin. Skin: No rashes.  Neuro: Alert, oriented X3, cranial nerves II-XII grossly intact, moves all extremities normally.  Psych: Patient is not psychotic, no suicidal or hemocidal ideation.  Labs on Admission: I have personally reviewed following labs and imaging studies  CBC: Recent Labs  Lab 01/21/23 1252  WBC 4.0  HGB 8.0*  HCT 23.9*  MCV 104.8*  PLT 77*   Basic Metabolic Panel: Recent Labs  Lab 01/21/23 1252 01/21/23 1550  NA 118* 118*  K 4.5 4.5  CL 90* 89*  CO2 21* 22  GLUCOSE 133* 113*  BUN 12 12  CREATININE 0.81 0.76  CALCIUM 8.4* 8.5*  MG 1.6*  --   PHOS 2.5  --    GFR: Estimated Creatinine Clearance: 80.4 mL/min (by C-G formula based on SCr of 0.76 mg/dL). Liver Function Tests: Recent Labs  Lab 01/21/23 1550  AST 31  ALT 16  ALKPHOS 131*  BILITOT 3.1*  PROT 7.0  ALBUMIN 3.0*   No results for input(s): "LIPASE", "AMYLASE" in the last 168 hours. Recent Labs  Lab 01/21/23 1550  AMMONIA 84*   Coagulation Profile: Recent Labs  Lab 01/21/23 1550  INR 2.7*   Cardiac Enzymes: No results for input(s): "CKTOTAL", "CKMB", "CKMBINDEX", "TROPONINI" in the last 168 hours. BNP (last 3 results) No results for input(s): "PROBNP" in the last 8760 hours. HbA1C: No results for input(s): "HGBA1C" in the last 72 hours. CBG: No results for input(s): "GLUCAP" in the last 168 hours. Lipid Profile: No results for input(s): "CHOL", "HDL", "LDLCALC", "TRIG", "CHOLHDL", "LDLDIRECT" in the last 72 hours. Thyroid Function Tests: No results for input(s): "TSH", "T4TOTAL", "FREET4", "T3FREE", "THYROIDAB" in the last 72 hours. Anemia Panel: No results for input(s): "VITAMINB12", "FOLATE", "FERRITIN", "TIBC", "IRON", "RETICCTPCT" in the last 72 hours. Urine analysis:    Component Value Date/Time   COLORURINE YELLOW (A) 08/29/2022 0906   APPEARANCEUR CLEAR (A)  08/29/2022 0906   LABSPEC 1.006 08/29/2022 0906   PHURINE 8.0 08/29/2022 0906   GLUCOSEU NEGATIVE 08/29/2022 0906   HGBUR NEGATIVE 08/29/2022 0906   BILIRUBINUR NEGATIVE 08/29/2022 0906   KETONESUR NEGATIVE 08/29/2022 0906   PROTEINUR NEGATIVE 08/29/2022 0906   NITRITE NEGATIVE 08/29/2022 0906   LEUKOCYTESUR NEGATIVE 08/29/2022 0906   Sepsis Labs: @LABRCNTIP (procalcitonin:4,lacticidven:4) )No results found for this or any previous  visit (from the past 240 hour(s)).   Radiological Exams on Admission: DG Chest 2 View  Result Date: 01/21/2023 CLINICAL DATA:  Chest pain, shortness of breath EXAM: CHEST - 2 VIEW COMPARISON:  12/02/2022 FINDINGS: Transverse diameter of heart is increased. Central pulmonary vessels are prominent. Increased interstitial markings are seen in the parahilar regions and lower lung fields. There is blunting of both lateral CP angles. There is no pneumothorax. Metallic sutures are seen in the sternum, possibly from previous coronary bypass surgery. IMPRESSION: Cardiomegaly. Increased interstitial markings are seen in both parahilar regions and lower lung fields suggesting interstitial pulmonary edema. Part of this finding may suggest underlying scarring. Small bilateral pleural effusions, more so on the right side. Electronically Signed   By: Ernie Avena M.D.   On: 01/21/2023 13:36      Assessment/Plan Principal Problem:   CHF exacerbation (HCC) Active Problems:   Hyponatremia   Hyperlipidemia   Alcohol abuse   Alcoholic cirrhosis (HCC)   CAD (coronary artery disease)   Atrial fibrillation, chronic (HCC)   Essential hypertension   COPD (chronic obstructive pulmonary disease) (HCC)   Gout   Barrett's esophagus   Thrombocytopenia (HCC)   Hypomagnesemia   Macrocytic anemia   Assessment and Plan:   CHF exacerbation [facility]): Patient has leg edema, positive JVD, crackles on auscultation, elevated BNP 559, clinically consistent with CHF exacerbation.  No  2D echo on record, not sure which type of CHF, will get 2D echo for further evaluation.  We are in difficult situation since he has hyponatremia with sodium of 118, limiting IV Lasix use.  Patient has 2 L new oxygen requirement, but no respiratory distress currently.  Consulted Dr. Cherylann Ratel of renal, who recommended to give test does of 40 mg Lasix x 1 and pending 2d echo.  Will admit to PCU as inpatient -f/u Dr. Garnett Farm recommendation for diuretic use. -Lasix 40 mg by IV, one dose now -2d echo -Daily weights -strict I/O's. -Fluid restriction -As needed bronchodilators for shortness of breath  Hyponatremia: Na 118, mental status is normal.  Most likely due to alcohol abuse and decreased oral intake.  - Will check urine sodium, urine osmolality, serum osmolality. - Fluid restriction - Sodium chloride tablet 1 g twice daily - f/u by BMP q6h - avoid over correction too fast due to risk of central pontine myelinolysis  Hyperlipidemia: Patient is not taking Lipitor currently -Follow-up  Alcohol abuse -CIWA protocol -Continue home naltrexone 50 mg daily -Did counseling about importance of quitting alcohol drinking  Alcoholic cirrhosis (HCC): Ammonia level 84, mental status normal -Start lactulose 20 g twice daily  CAD (coronary artery disease): No chest pain, patient's not -Observe closely  Atrial fibrillation, chronic (HCC): Heart rate is 32, 72, 5\6 -Coreg -Eliquis  Essential hypertension -IV hydralazine as needed -Coreg  COPD (chronic obstructive pulmonary disease) (HCC): No wheezing -bronchodilators  Gout -Allopurinol  Barrett's esophagus -Protonix  Thrombocytopenia (HCC): Platelet is 77, no active bleeding.  This is most likely due to liver cirrhosis -Follow-up CBC  Hypomagnesemia: Magnesium 1.6, phosphorus 2.5, potassium 4.5 -Repleted magnesium  Macrocytic anemia: Hemoglobin 8.0 (8.9 on 12/03/2022), denies rectal bleeding -Follow-up with CBC      DVT ppx:  SCD  Code Status: Full code  Family Communication:    Yes, patient's wife   at bed side.  Disposition Plan:  Anticipate discharge back to previous environment  Consults called:  Dr. Cherylann Ratel of renal  Admission status and Level of care: Progressive:  as inpt  Dispo: The patient is from: Home              Anticipated d/c is to: Home              Anticipated d/c date is: 2 days              Patient currently is not medically stable to d/c.    Severity of Illness:  The appropriate patient status for this patient is INPATIENT. Inpatient status is judged to be reasonable and necessary in order to provide the required intensity of service to ensure the patient's safety. The patient's presenting symptoms, physical exam findings, and initial radiographic and laboratory data in the context of their chronic comorbidities is felt to place them at high risk for further clinical deterioration. Furthermore, it is not anticipated that the patient will be medically stable for discharge from the hospital within 2 midnights of admission.   * I certify that at the point of admission it is my clinical judgment that the patient will require inpatient hospital care spanning beyond 2 midnights from the point of admission due to high intensity of service, high risk for further deterioration and high frequency of surveillance required.*       Date of Service 01/21/2023    Lorretta Harp [facility]   If 7PM-7AM, please contact night-coverage [email or web address] 01/21/2023, 5:02 PM

## 2023-01-21 NOTE — ED Provider Notes (Signed)
[facility] Provider Note    Event Date/Time   First MD Initiated Contact with Patient 01/21/23 1433     (approximate)   History   Chest Pain and Shortness of Breath   HPI  Kenneth Stevenson is a 77 y.o. male   Past medical history of atrial fibrillation on Eliquis, alcohol use, cirrhosis, CHF, not on home oxygen, who presents to the emergency department with shortness of breath over the last several days, feeling weak, feeling tired, poor p.o. intake.  Denies fevers chills chest pain GI bleeding nausea vomiting diarrhea or urinary complaints.  He drinks alcohol 2-4 drinks daily.  He has a history of CHF but did not take diuretics because he did not tolerate in the past.  He has atrial fibrillation on Eliquis compliant with his anticoagulation.  No trauma.  Independent Historian contributed to assessment above: His daughter and wife contributed to history as above       Physical Exam   Triage Vital Signs: ED Triage Vitals  Enc Vitals Group     BP 01/21/23 1252 (!) 100/56     Pulse Rate 01/21/23 1252 71     Resp 01/21/23 1252 (!) 26     Temp 01/21/23 1252 97.6 F (36.4 C)     Temp src --      SpO2 01/21/23 1252 (!) 89 %     Weight 01/21/23 1254 167 lb (75.8 kg)     Height 01/21/23 1254 5' 10.75" (1.797 m)     Head Circumference --      Peak Flow --      Pain Score 01/21/23 1253 5     Pain Loc --      Pain Edu? --      Excl. in GC? --     Most recent vital signs: Vitals:   01/21/23 1419 01/21/23 1430  BP:  123/68  Pulse: 64 72  Resp: 20 13  Temp:    SpO2: 94% 99%    General: Awake, no distress.  CV:  Good peripheral perfusion. Resp:  Normal effort.  Abd:  No distention.  Other:  No respiratory distress appears comfortable, mentating normally.  No signs of alcohol withdrawal currently.  Soft nontender abdomen but rales on auscultation of the lungs.   ED Results / Procedures / Treatments   Labs (all labs ordered are listed, but  only abnormal results are displayed) Labs Reviewed  BASIC METABOLIC PANEL - Abnormal; Notable for the following components:      Result Value   Sodium 118 (*)    Chloride 90 (*)    CO2 21 (*)    Glucose, Bld 133 (*)    Calcium 8.4 (*)    All other components within normal limits  CBC - Abnormal; Notable for the following components:   RBC 2.28 (*)    Hemoglobin 8.0 (*)    HCT 23.9 (*)    MCV 104.8 (*)    MCH 35.1 (*)    RDW 16.8 (*)    Platelets 77 (*)    All other components within normal limits  BRAIN NATRIURETIC PEPTIDE  MAGNESIUM  PHOSPHORUS  PROTIME-INR  APTT  AMMONIA  OSMOLALITY, URINE  OSMOLALITY  SODIUM, URINE, RANDOM  BASIC METABOLIC PANEL  BASIC METABOLIC PANEL  BASIC METABOLIC PANEL  HEPATIC FUNCTION PANEL  TROPONIN I (HIGH SENSITIVITY)  TROPONIN I (HIGH SENSITIVITY)     I ordered and reviewed the above labs they are notable for Na 118  EKG  ED ECG REPORT I, Pilar Jarvis, the attending physician, personally viewed and interpreted this ECG.   Date: 01/21/2023  EKG Time: 1248  Rate: 70  Rhythm: AF  Axis: nl  Intervals:non  ST&T Change: no stemi    RADIOLOGY I independently reviewed and interpreted chest x-ray and see pulmonary edema   PROCEDURES:  Critical Care performed: Yes, see critical care procedure note(s)  .Critical Care  Performed by: Pilar Jarvis, MD Authorized by: Pilar Jarvis, MD   Critical care provider statement:    Critical care time (minutes):  30   Critical care was time spent personally by me on the following activities:  Development of treatment plan with patient or surrogate, discussions with consultants, evaluation of patient's response to treatment, examination of patient, ordering and review of laboratory studies, ordering and review of radiographic studies, ordering and performing treatments and interventions, pulse oximetry, re-evaluation of patient's condition and review of old charts    MEDICATIONS ORDERED IN  ED: Medications  thiamine (VITAMIN B1) tablet 100 mg (100 mg Oral Given 01/21/23 1502)    Or  thiamine (VITAMIN B1) injection 100 mg ( Intravenous See Alternative 01/21/23 1502)  folic acid (FOLVITE) tablet 1 mg (1 mg Oral Given 01/21/23 1501)  multivitamin with minerals tablet 1 tablet (1 tablet Oral Given 01/21/23 1501)  LORazepam (ATIVAN) tablet 1-4 mg (has no administration in time range)    Or  LORazepam (ATIVAN) injection 1-4 mg (has no administration in time range)  LORazepam (ATIVAN) injection 0-4 mg ( Intravenous Not Given 01/21/23 1519)    Followed by  LORazepam (ATIVAN) injection 0-4 mg (has no administration in time range)  albuterol (PROVENTIL) (2.5 MG/3ML) 0.083% nebulizer solution 3 mL (has no administration in time range)  dextromethorphan-guaiFENesin (MUCINEX DM) 30-600 MG per 12 hr tablet 1 tablet (has no administration in time range)  sodium chloride tablet 1 g (has no administration in time range)  diphenhydrAMINE (BENADRYL) injection 12.5 mg (has no administration in time range)  hydrALAZINE (APRESOLINE) injection 5 mg (has no administration in time range)  acetaminophen (TYLENOL) tablet 650 mg (has no administration in time range)  allopurinol (ZYLOPRIM) tablet 200 mg (has no administration in time range)  carvedilol (COREG) tablet 12.5 mg (has no administration in time range)  magnesium oxide (MAG-OX) tablet 400 mg (has no administration in time range)  pantoprazole (PROTONIX) EC tablet 40 mg (has no administration in time range)  apixaban (ELIQUIS) tablet 5 mg (has no administration in time range)  vitamin B-12 (CYANOCOBALAMIN) tablet 100 mcg (has no administration in time range)  naltrexone (DEPADE) tablet 50 mg (has no administration in time range)  pyridOXINE (VITAMIN B6) tablet 100 mg (has no administration in time range)  arformoterol (BROVANA) nebulizer solution 15 mcg (has no administration in time range)    And  umeclidinium bromide (INCRUSE ELLIPTA) 62.5  MCG/ACT 1 puff (has no administration in time range)    External physician / consultants:  I spoke with [facility] nephro regarding care plan for this patient.   IMPRESSION / MDM / ASSESSMENT AND PLAN / ED COURSE  I reviewed the triage vital signs and the nursing notes.                                Patient's presentation is most consistent with acute presentation with potential threat to life or bodily function.  Differential diagnosis includes, but is not limited to, hyponatremia in the  setting of poor p.o. intake/hypobulimia, CHF exacerbation, COPD exacerbation, ACS or PE   The patient is on the cardiac monitor to evaluate for evidence of arrhythmia and/or significant heart rate changes.  MDM:    Patient with CHF not on diuretics with evidence of pulmonary edema on chest x-ray and rales on auscultation concerning for CHF exacerbation however he is not in respiratory distress and has no new oxygen requirement.  Unfortunately his sodium is critically low as well at 118 in the setting of hypovolemia and poor p.o. intake, no neurologic complaints or seizures.  Will defer hypertonic at this time.  I hesitate to diurese him for CHF exacerbation for fear of worsening his hyponatremia.  At the same time I will not give him any saline at this time for fear of worsening his CHF exacerbation, since he is stable I will have him admitted for careful correction of sodium as well as treatment of his CHF exacerbation.        FINAL CLINICAL IMPRESSION(S) / ED DIAGNOSES   Final diagnoses:  Hyponatremia  Acute on chronic congestive heart failure, unspecified heart failure type (HCC)     Rx / DC Orders   ED Discharge Orders     None        Note:  This document was prepared using Dragon voice recognition software and may include unintentional dictation errors.    Pilar Jarvis, MD 01/21/23 1600

## 2023-01-21 NOTE — ED Notes (Signed)
Pt reports taking his Eliquis and Pantoprazole daily instead of the noted BID in chart. Pt is GCS 15, nurse made aware and second doses were not given of those medications

## 2023-01-22 ENCOUNTER — Encounter: Payer: Self-pay | Admitting: Internal Medicine

## 2023-01-22 ENCOUNTER — Inpatient Hospital Stay (HOSPITAL_COMMUNITY)
Admit: 2023-01-22 | Discharge: 2023-01-22 | Disposition: A | Discharge status: Home / Self Care | Payer: No Typology Code available for payment source | Attending: Internal Medicine | Admitting: Internal Medicine

## 2023-01-22 DIAGNOSIS — I5031 Acute diastolic (congestive) heart failure: Secondary | ICD-10-CM | POA: Diagnosis not present

## 2023-01-22 DIAGNOSIS — I5033 Acute on chronic diastolic (congestive) heart failure: Secondary | ICD-10-CM | POA: Diagnosis not present

## 2023-01-22 LAB — ECHOCARDIOGRAM COMPLETE
AR max vel: 1.51 cm2
AV Area VTI: 1.59 cm2
AV Area mean vel: 1.49 cm2
AV Mean grad: 5 mmHg
AV Peak grad: 9.1 mmHg
Ao pk vel: 1.51 m/s
Area-P 1/2: 4.21 cm2
Height: 70.75 in
S' Lateral: 2.5 cm
Weight: 2672 oz

## 2023-01-22 LAB — BASIC METABOLIC PANEL
Anion gap: 8 (ref 5–15)
Anion gap: 6 (ref 5–15)
Anion gap: 7 (ref 5–15)
BUN: 12 mg/dL (ref 8–23)
BUN: 12 mg/dL (ref 8–23)
BUN: 11 mg/dL (ref 8–23)
CO2: 23 mmol/L (ref 22–32)
CO2: 24 mmol/L (ref 22–32)
CO2: 23 mmol/L (ref 22–32)
Calcium: 8.5 mg/dL — ABNORMAL LOW (ref 8.9–10.3)
Calcium: 8.5 mg/dL — ABNORMAL LOW (ref 8.9–10.3)
Calcium: 8.3 mg/dL — ABNORMAL LOW (ref 8.9–10.3)
Chloride: 88 mmol/L — ABNORMAL LOW (ref 98–111)
Chloride: 90 mmol/L — ABNORMAL LOW (ref 98–111)
Chloride: 91 mmol/L — ABNORMAL LOW (ref 98–111)
Creatinine, Ser: 0.75 mg/dL (ref 0.61–1.24)
Creatinine, Ser: 0.71 mg/dL (ref 0.61–1.24)
Creatinine, Ser: 0.8 mg/dL (ref 0.61–1.24)
GFR, Estimated: >60 mL/min (ref >60)
GFR, Estimated: >60 mL/min (ref >60)
GFR, Estimated: >60 mL/min (ref >60)
Glucose, Bld: 111 mg/dL — ABNORMAL HIGH (ref 70–99)
Glucose, Bld: 121 mg/dL — ABNORMAL HIGH (ref 70–99)
Glucose, Bld: 122 mg/dL — ABNORMAL HIGH (ref 70–99)
Potassium: 3.9 mmol/L (ref 3.5–5.1)
Potassium: 4.1 mmol/L (ref 3.5–5.1)
Potassium: 4.1 mmol/L (ref 3.5–5.1)
Sodium: 119 mmol/L — CL (ref 135–145)
Sodium: 120 mmol/L — ABNORMAL LOW (ref 135–145)
Sodium: 121 mmol/L — ABNORMAL LOW (ref 135–145)

## 2023-01-22 LAB — CBC
HCT: 25.4 % — ABNORMAL LOW (ref 39.0–52.0)
Hemoglobin: 8.7 g/dL — ABNORMAL LOW (ref 13.0–17.0)
MCH: 34.9 pg — ABNORMAL HIGH (ref 26.0–34.0)
MCHC: 34.3 g/dL (ref 30.0–36.0)
MCV: 102 fL — ABNORMAL HIGH (ref 80.0–100.0)
Platelets: 92 10*3/uL — ABNORMAL LOW (ref 150–400)
RBC: 2.49 MIL/uL — ABNORMAL LOW (ref 4.22–5.81)
RDW: 16.4 % — ABNORMAL HIGH (ref 11.5–15.5)
WBC: 4.8 10*3/uL (ref 4.0–10.5)
nRBC: 0 % (ref 0.0–0.2)

## 2023-01-22 LAB — MAGNESIUM: Magnesium: 2 mg/dL (ref 1.7–2.4)

## 2023-01-22 LAB — OSMOLALITY: Osmolality: 258 mOsm/kg — ABNORMAL LOW (ref 275–295)

## 2023-01-22 MED ORDER — SODIUM CHLORIDE 3 % IV SOLN: INTRAVENOUS | Status: DC

## 2023-01-22 MED ORDER — POTASSIUM CHLORIDE CRYS ER 20 MEQ PO TBCR
40.0000 meq | EXTENDED_RELEASE_TABLET | Freq: Once | ORAL | Status: AC
  Administered 2023-01-22: 40 meq via ORAL
  Filled 2023-01-22: qty 2

## 2023-01-22 MED ORDER — SODIUM CHLORIDE 1 G PO TABS
2.0000 g | ORAL_TABLET | Freq: Three times a day (TID) | ORAL | Status: DC
  Administered 2023-01-22 – 2023-01-25 (×10): 2 g via ORAL
  Filled 2023-01-22 (×10): qty 2

## 2023-01-22 MED ORDER — TRAZODONE HCL 50 MG PO TABS
50.0000 mg | ORAL_TABLET | Freq: Every evening | ORAL | Status: DC | PRN
  Administered 2023-01-22: 50 mg via ORAL
  Filled 2023-01-22 (×2): qty 1

## 2023-01-22 MED ORDER — METOPROLOL TARTRATE 5 MG/5ML IV SOLN: 5.0000 mg | INTRAVENOUS | Status: DC | PRN

## 2023-01-22 MED ORDER — SENNOSIDES-DOCUSATE SODIUM 8.6-50 MG PO TABS: 1.0000 | ORAL_TABLET | Freq: Every evening | ORAL | Status: DC | PRN

## 2023-01-22 MED ORDER — IPRATROPIUM-ALBUTEROL 0.5-2.5 (3) MG/3ML IN SOLN
3.0000 mL | RESPIRATORY_TRACT | Status: DC | PRN
  Administered 2023-01-22 – 2023-01-24 (×2): 3 mL via RESPIRATORY_TRACT
  Filled 2023-01-22: qty 3

## 2023-01-22 MED ORDER — GUAIFENESIN 100 MG/5ML PO LIQD: 5.0000 mL | ORAL | Status: DC | PRN

## 2023-01-22 MED ORDER — HYDRALAZINE HCL 20 MG/ML IJ SOLN: 10.0000 mg | INTRAMUSCULAR | Status: DC | PRN

## 2023-01-22 MED ORDER — ONDANSETRON HCL 4 MG/2ML IJ SOLN: 4.0000 mg | Freq: Four times a day (QID) | INTRAMUSCULAR | Status: DC | PRN

## 2023-01-22 MED ORDER — FUROSEMIDE 10 MG/ML IJ SOLN
5.0000 mg/h | INTRAVENOUS | Status: DC
  Administered 2023-01-22: 5 mg/h via INTRAVENOUS
  Filled 2023-01-22 (×2): qty 20

## 2023-01-22 MED ORDER — POLYVINYL ALCOHOL 1.4 % OP SOLN
2.0000 [drp] | OPHTHALMIC | Status: DC | PRN
  Administered 2023-01-22: 2 [drp] via OPHTHALMIC
  Filled 2023-01-22: qty 15

## 2023-01-22 NOTE — Discharge Instructions (Addendum)
Low Sodium Nutrition Therapy  Eating less sodium can help you if you have high blood pressure, heart failure, or kidney or liver disease.   Your body needs a little sodium, but too much sodium can cause your body to hold onto extra water. This extra water will raise your blood pressure and can cause damage to your heart, kidneys, or liver as they are forced to work harder.   Sometimes you can see how the extra fluid affects you because your hands, legs, or belly swell. You may also hold water around your heart and lungs, which makes it hard to breathe.   Even if you take medication for blood pressure or a water pill (diuretic) to remove fluid, it is still important to have less salt in your diet.   Check with your primary care provider before drinking alcohol since it may affect the amount of fluid in your body and how your heart, kidneys, or liver work. Sodium in Food A low-sodium meal plan limits the sodium that you get from food and beverages to 1,500-2,000 milligrams (mg) per day. Salt is the main source of sodium. Read the nutrition label on the package to find out how much sodium is in one serving of a food.  Select foods with 140 milligrams (mg) of sodium or less per serving.  You may be able to eat one or two servings of foods with a little more than 140 milligrams (mg) of sodium if you are closely watching how much sodium you eat in a day.  Check the serving size on the label. The amount of sodium listed on the label shows the amount in one serving of the food. So, if you eat more than one serving, you will get more sodium than the amount listed.  Tips Cutting Back on Sodium Eat more fresh foods.  Fresh fruits and vegetables are low in sodium, as well as frozen vegetables and fruits that have no added juices or sauces.  Fresh meats are lower in sodium than processed meats, such as bacon, sausage, and hotdogs.  Not all processed foods are unhealthy, but some processed foods may have too  much sodium.  Eat less salt at the table and when cooking. One of the ingredients in salt is sodium.  One teaspoon of table salt has 2,300 milligrams of sodium.  Leave the salt out of recipes for pasta, casseroles, and soups. Be a Engineer, building services.  Food packages that say "Salt-free", sodium-free", "very low sodium," and "low sodium" have less than 140 milligrams of sodium per serving.  Beware of products identified as "Unsalted," "No Salt Added," "Reduced Sodium," or "Lower Sodium." These items may still be high in sodium. You should always check the nutrition label. Add flavors to your food without adding sodium.  Try lemon juice, lime juice, or vinegar.  Dry or fresh herbs add flavor.  Buy a sodium-free seasoning blend or make your own at home. You can purchase salt-free or sodium-free condiments like barbeque sauce in stores and online. Ask your registered dietitian nutritionist for recommendations and where to find them.   Eating in Restaurants Choose foods carefully when you eat outside your home. Restaurant foods can be very high in sodium. Many restaurants provide nutrition facts on their menus or their websites. If you cannot find that information, ask your server. Let your server know that you want your food to be cooked without salt and that you would like your salad dressing and sauces to be served on the  side.    Foods Recommended Food Group Foods Recommended  Grains Bread, bagels, rolls without salted tops Homemade bread made with reduced-sodium baking powder Cold cereals, especially shredded wheat and puffed rice Oats, grits, or cream of wheat Pastas, quinoa, and rice Popcorn, pretzels or crackers without salt Corn tortillas  Protein Foods Fresh meats and fish; [place] bacon (check the nutrition labels - make sure they are not packaged in a sodium solution) Canned or packed tuna (no more than 4 ounces at 1 serving) Beans and peas Soybeans) and tofu Eggs Nuts or nut butters  without salt  Dairy Milk or milk powder Plant milks, such as rice and soy Yogurt, including Greek yogurt Small amounts of natural cheese (blocks of cheese) or reduced-sodium cheese can be used in moderation. (Swiss, ricotta, and fresh mozzarella cheese are lower in sodium than the others) Cream Cheese Low sodium cottage cheese  Vegetables Fresh and frozen vegetables without added sauces or salt Homemade soups (without salt) Low-sodium, salt-free or sodium-free canned vegetables and soups  Fruit Fresh and canned fruits Dried fruits, such as raisins, cranberries, and prunes  Oils Tub or liquid margarine, regular or without salt Canola, corn, peanut, olive, safflower, or sunflower oils  Condiments Fresh or dried herbs such as basil, bay leaf, dill, mustard (dry), nutmeg, paprika, parsley, rosemary, sage, or thyme.  Low sodium ketchup Vinegar  Lemon or lime juice Pepper, red pepper flakes, and cayenne. Hot sauce contains sodium, but if you use just a drop or two, it will not add up to much.  Salt-free or sodium-free seasoning mixes and marinades Simple salad dressings: vinegar and oil   Foods Not Recommended Food Group Foods Not Recommended  Grains Breads or crackers topped with salt Cereals (hot/cold) with more than 300 mg sodium per serving Biscuits, cornbread, and other "quick" breads prepared with baking soda Pre-packaged bread crumbs Seasoned and packaged rice and pasta mixes Self-rising flours  Protein Foods Cured meats: Bacon, ham, sausage, pepperoni and hot dogs Canned meats (chili, vienna sausage, or sardines) Smoked fish and meats Frozen meals that have more than 600 mg of sodium per serving Egg substitute (with added sodium)  Dairy Buttermilk Processed cheese spreads Cottage cheese (1 cup may have over 500 mg of sodium; look for low-sodium.) American or feta cheese Shredded Cheese has more sodium than blocks of cheese String cheese  Vegetables Canned vegetables  (unless they are salt-free, sodium-free or low sodium) Frozen vegetables with seasoning and sauces Sauerkraut and pickled vegetables Canned or dried soups (unless they are salt-free, sodium-free, or low sodium) [place] fries and onion rings  Fruit Dried fruits preserved with additives that have sodium  Oils Salted butter or margarine, all types of olives  Condiments Salt, sea salt, kosher salt, onion salt, and garlic salt Seasoning mixes with salt Bouillon cubes Ketchup Barbeque sauce and Worcestershire sauce unless low sodium Soy sauce Salsa, pickles, olives, relish Salad dressings: ranch, blue cheese, [place], and [place].   Low Sodium Sample 1-Day Menu  Breakfast 1 cup cooked oatmeal  1 slice whole wheat bread toast  1 tablespoon peanut butter without salt  1 banana  1 cup 1% milk  Lunch Tacos made with: 2 corn tortillas   cup black beans, low sodium   cup roasted or grilled chicken (without skin)   avocado  Squeeze of lime juice  1 cup salad greens  1 tablespoon low-sodium salad dressing   cup strawberries  1 orange  Afternoon Snack 1/3 cup grapes  6 ounces yogurt  Evening Meal 3 ounces herb-baked fish  1 baked potato  2 teaspoons olive oil   cup cooked carrots  2 thick slices tomatoes on:  2 lettuce leaves  1 teaspoon olive oil  1 teaspoon balsamic vinegar  1 cup 1% milk  Evening Snack 1 apple   cup almonds without salt   Low-Sodium Vegetarian (Lacto-Ovo) Sample 1-Day Menu  Breakfast 1 cup cooked oatmeal  1 slice whole wheat toast  1 tablespoon peanut butter without salt  1 banana  1 cup 1% milk  Lunch Tacos made with: 2 corn tortillas   cup black beans, low sodium   cup roasted or grilled chicken (without skin)   avocado  Squeeze of lime juice  1 cup salad greens  1 tablespoon low-sodium salad dressing   cup strawberries  1 orange  Evening Meal Stir fry made with:  cup tofu  1 cup brown rice   cup broccoli   cup green beans   cup  peppers   tablespoon peanut oil  1 orange  1 cup 1% milk  Evening Snack 4 strips celery  2 tablespoons hummus  1 hard-boiled egg   Low-Sodium Vegan Sample 1-Day Menu  Breakfast 1 cup cooked oatmeal  1 tablespoon peanut butter without salt  1 cup blueberries  1 cup soymilk fortified with calcium, vitamin B12, and vitamin D  Lunch 1 small whole wheat pita   cup cooked lentils  2 tablespoons hummus  4 carrot sticks  1 medium apple  1 cup soymilk fortified with calcium, vitamin B12, and vitamin D  Evening Meal Stir fry made with:  cup tofu  1 cup brown rice   cup broccoli   cup green beans   cup peppers   tablespoon peanut oil  1 cup cantaloupe  Evening Snack 1 cup soy yogurt   cup mixed nuts  Copyright 2020  Academy [employer]. All rights reserved  Sodium Free Flavoring Tips  When cooking, the following items may be used for flavoring instead of salt or seasonings that contain sodium. Remember: A little bit of spice goes a long way! Be careful not to overseason. Spice Blend Recipe (makes about ? cup) 5 teaspoons onion powder  2 teaspoons garlic powder  2 teaspoons paprika  2 teaspoon dry mustard  1 teaspoon crushed thyme leaves   teaspoon white pepper   teaspoon celery seed Food Item Flavorings  Beef Basil, bay leaf, caraway, curry, dill, dry mustard, garlic, grape jelly, green pepper, mace, marjoram, mushrooms (fresh), nutmeg, onion or onion powder, parsley, pepper, rosemary, sage  Chicken Basil, cloves, cranberries, mace, mushrooms (fresh), nutmeg, oregano, paprika, parsley, pineapple, saffron, sage, savory, tarragon, thyme, tomato, turmeric  Egg Chervil, curry, dill, dry mustard, garlic or garlic powder, green pepper, jelly, mushrooms (fresh), nutmeg, onion powder, paprika, parsley, rosemary, tarragon, tomato  Fish Basil, bay leaf, chervil, curry, dill, dry mustard, green pepper, lemon juice, marjoram, mushrooms (fresh), paprika, pepper,  tarragon, tomato, turmeric  Lamb Cloves, curry, dill, garlic or garlic powder, mace, mint, mint jelly, onion, oregano, parsley, pineapple, rosemary, tarragon, thyme  Pork Applesauce, basil, caraway, chives, cloves, garlic or garlic powder, onion or onion powder, rosemary, thyme  Veal Apricots, basil, bay leaf, currant jelly, curry, ginger, marjoram, mushrooms (fresh), oregano, paprika  Vegetables Basil, dill, garlic or garlic powder, ginger, lemon juice, mace, marjoram, nutmeg, onion or onion powder, tarragon, tomato, sugar or sugar substitute, salt-free salad dressing, vinegar  Desserts Allspice, anise, cinnamon, cloves, ginger, mace, nutmeg, vanilla extract, other  extracts   Copyright 2020  Academy of [employer]etics. All rights reserved  Fluid Restricted Nutrition Therapy  You have been prescribed this diet because your condition affects how much fluid you can eat or drink. If your heart, liver, or kidneys aren't working properly, you may not be able to effectively eliminate fluids from the body and this may cause swelling (edema) in the legs, arms, and/or stomach. Drink no more than _________ liters or ________ ounces or ________cups of fluid per day.  You don't need to stop eating or drinking the same fluids you normally would, but you may need to eat or drink less than usual.  Your registered dietitian nutritionist will help you determine the correct amount of fluid to consume during the day Breakfast Include fluids taken with medications  Lunch Include fluids taken with medications  Dinner Include fluids taken with medications  Bedtime Snack Include fluids taken with medications     Tips What Are Fluids?  A fluid is anything that is liquid or anything that would melt if left at room temperature. You will need to count these foods and liquids--including any liquid used to take medication--as part of your daily fluid intake. Some examples are: Alcohol (drink only with your  doctor's permission)  Coffee, tea, and other hot beverages  Gelatin (Jell-O)  Gravy  Ice cream, sherbet, sorbet  Ice cubes, ice chips  Milk, liquid creamer  Nutritional supplements  Popsicles  Vegetable and fruit juices; fluid in canned fruit  Watermelon  Yogurt  Soft drinks, lemonade, limeade  Soups  Syrup How Do I Measure My Fluid Intake? Record your fluid intake daily.  Tip: Every day, each time you eat or drink fluids, pour water in the same amount into an empty container that can hold the same amount of fluids you are allowed daily. This may help you keep track of how much fluid you are taking in throughout the day.  To accurately keep track of how much liquid you take in, measure the size of the cups, glasses, and bowls you use. If you eat soup, measure how much of it is liquid and how much is solid (such as noodles, vegetables, meat). Conversions for Measuring Fluid Intake  Milliliters (mL) Liters (L) Ounces (oz) Cups (c)  1000 1 32 4  1200 1.2 40 5  1500 1.5 50 6 1/4  1800 1.8 60 7 1/2  2000 2 67 8 1/3  Tips to Reduce Your Thirst Chew gum or suck on hard candy.  Rinse or gargle with mouthwash. Do not swallow.  Ice chips or popsicles my help quench thirst, but this too needs to be calculated into the total restriction. Melt ice chips or cubes first to figure out how much fluid they produce (for example, experiment with melting  cup ice chips or 2 ice cubes).  Add a lemon wedge to your water.  Limit how much salt you take in. A high salt intake might make you thirstier.  Don't eat or drink all your allowed liquids at once. Space your liquids out through the day.  Use small glasses and cups and sip slowly. If allowed, take your medications with fluids you eat or drink during a meal.   Fluid-Restricted Nutrition Therapy Sample 1-Day Menu  Breakfast 1 slice wheat toast  1 tablespoon peanut butter  1/2 cup yogurt (120 milliliters)  1/2 cup blueberries  1 cup milk (240  milliliters)   Lunch 3 ounces sliced [place]  2 slices whole wheat  bread  1/2 cup lettuce for sandwich  2 slices tomato for sandwich  1 ounce reduced-fat, reduced-sodium cheese  1/2 cup fresh carrot sticks  1 banana  1 cup unsweetened tea (240 milliliters)   Evening Meal 8 ounces soup (240 milliliters)  3 ounces salmon  1/2 cup quinoa  1 cup green beans  1 cup mixed greens salad  1 tablespoon olive oil  1 cup coffee (240 milliliters)  Evening Snack 1/2 cup sliced peaches  1/2 cup frozen yogurt (120 milliliters)  1 cup water (240 milliliters)  Copyright 2020  Academy of Nutrition and Dietetics. All rights reserved   ABSTAIN FROM DRINKING ALCOHOL [facility] up with your [facility] in 1-3 weeks

## 2023-01-22 NOTE — Consult Note (Signed)
PHARMACY CONSULT NOTE - FOLLOW UP  Pharmacy Consult for Electrolyte Monitoring and Replacement   Recent Labs: Potassium (mmol/L)  Date Value  01/22/2023 3.9  05/03/2013 4.0   Magnesium (mg/dL)  Date Value  96/11/5407 1.6 (L)   Calcium (mg/dL)  Date Value  81/19/1478 8.5 (L)   Calcium, Total (mg/dL)  Date Value  29/56/2130 9.1   Albumin (g/dL)  Date Value  86/57/8469 3.0 (L)   Phosphorus (mg/dL)  Date Value  62/95/2841 2.5   Sodium (mmol/L)  Date Value  01/22/2023 119 (LL)  05/03/2013 133 (L)    Assessment: 77 y.o. male with PMH EtOH misuse, alcoholic cirrhosis, CAD, CABG, CHF, Afib on Eliquis, gout who presents with SOB requiring 2 L O2. Was recently hospitalized 4/9-4/20 for heart failure exacerbation and pt endorses non-adherence to furosemide as of late.  Pertinent meds: Sodium chloride 2 g PO TIDCC Furosemide drip @ 5 mg/hr  Goal of Therapy:  K >/= 4.0 and Mg >/= 2.0  Plan:  K 3.9 >> potassium chloride 40 mEq PO x 1 Mg 1.6 on 5/29 and repleted >> Mg check ordered for today No other electrolyte replacement warranted currently Follow-up electrolytes with AM labs  Kenneth Stevenson, PharmD PGY-1 Pharmacy Resident 01/22/2023 9:13 AM

## 2023-01-22 NOTE — Consult Note (Signed)
[facility]  CONSULT NOTE    Date: 01/22/2023                  Patient Name:  Kenneth Stevenson  MRN: [id number]  DOB: Apr 24, 1946  Age / Sex: 77 y.o., male         PCP: [facility]                 Service Requesting Consult: TRH                 Reason for Consult: Hyponatremia            History of Present Illness: Kenneth Stevenson is a 77 y.o.  male with past medical conditions including GERD, hypertension, alcoholic liver cirrhosis, hyperlipidemia, CHF, CAD, and atrial fibrillation on Eliquis, who was admitted to [facility] on 01/21/2023 for Hyponatremia [E87.1] CHF exacerbation (HCC) [I50.9]  Patient presents to the emergency department with complaints of shortness of breath and chest tightness.  It appears patient was recently hospitalized for a CHF exacerbation.  States he does not take diuretics at home.  History of alcohol abuse, states last drink was yesterday prior to ED arrival.  Denies known fever or chills.  Labs on ED arrival significant for sodium 118, serum bicarb 21, magnesium 1.6, BNP 559, and hemoglobin 8.0.  Chest x-ray shows cardiomegaly and suggested interstitial pulmonary edema.  Patient received furosemide test dose in ED yesterday.  No recorded  urine output and sodium improved by 1 point to 119.  Awaiting echo   Medications: Outpatient medications: (Not in a hospital admission)   Current medications: Current Facility-Administered Medications  Medication Dose Route Frequency Provider Last Rate Last Admin   acetaminophen (TYLENOL) tablet 650 mg  650 mg Oral Q6H PRN Lorretta Harp, MD       allopurinol (ZYLOPRIM) tablet 200 mg  200 mg Oral Daily Lorretta Harp, MD   200 mg at 01/22/23 1046   apixaban (ELIQUIS) tablet 5 mg  5 mg Oral BID Lorretta Harp, MD   5 mg at 01/22/23 1045   arformoterol (BROVANA) nebulizer solution 15 mcg  15 mcg Nebulization BID Lorretta Harp, MD   15 mcg at 01/22/23 7829   And   umeclidinium bromide (INCRUSE ELLIPTA)  62.5 MCG/ACT 1 puff  1 puff Inhalation Daily Lorretta Harp, MD   1 puff at 01/22/23 0947   carvedilol (COREG) tablet 12.5 mg  12.5 mg Oral BID WC Lorretta Harp, MD   12.5 mg at 01/22/23 5621   diphenhydrAMINE (BENADRYL) injection 12.5 mg  12.5 mg Intravenous Q8H PRN Lorretta Harp, MD       folic acid (FOLVITE) tablet 1 mg  1 mg Oral Daily Pilar Jarvis, MD   1 mg at 01/22/23 1046   furosemide (LASIX) 200 mg in dextrose 5 % 100 mL (2 mg/mL) infusion  5 mg/hr Intravenous Continuous Wendee Beavers, NP 2.5 mL/hr at 01/22/23 1259 5 mg/hr at 01/22/23 1259   guaiFENesin (ROBITUSSIN) 100 MG/5ML liquid 5 mL  5 mL Oral Q4H PRN Amin, Ankit Chirag, MD       hydrALAZINE (APRESOLINE) injection 10 mg  10 mg Intravenous Q4H PRN Amin, Ankit Chirag, MD       ipratropium-albuterol (DUONEB) 0.5-2.5 (3) MG/3ML nebulizer solution 3 mL  3 mL Nebulization Q4H PRN Amin, Ankit Chirag, MD       lactulose (CHRONULAC) 10 GM/15ML solution 20 g  20 g Oral BID Lorretta Harp, MD   20 g at  01/22/23 1046   LORazepam (ATIVAN) injection 0-4 mg  0-4 mg Intravenous Q6H Lorretta Harp, MD       Followed by   Melene Muller ON 01/23/2023] LORazepam (ATIVAN) injection 0-4 mg  0-4 mg Intravenous Q12H Lorretta Harp, MD       LORazepam (ATIVAN) tablet 1-4 mg  1-4 mg Oral Q1H PRN Lorretta Harp, MD       Or   LORazepam (ATIVAN) injection 1-4 mg  1-4 mg Intravenous Q1H PRN Lorretta Harp, MD       magnesium oxide (MAG-OX) tablet 400 mg  400 mg Oral BID Lorretta Harp, MD   400 mg at 01/22/23 1610   metoprolol tartrate (LOPRESSOR) injection 5 mg  5 mg Intravenous Q4H PRN Amin, Loura Halt, MD       multivitamin with minerals tablet 1 tablet  1 tablet Oral Daily Pilar Jarvis, MD   1 tablet at 01/22/23 9604   naltrexone (DEPADE) tablet 50 mg  50 mg Oral Daily Lorretta Harp, MD   50 mg at 01/22/23 0916   ondansetron (ZOFRAN) injection 4 mg  4 mg Intravenous Q6H PRN Amin, Ankit Chirag, MD       pantoprazole (PROTONIX) EC tablet 40 mg  40 mg Oral BID Lorretta Harp, MD   40 mg at 01/22/23 5409    pyridOXINE (VITAMIN B6) tablet 100 mg  100 mg Oral Daily Lorretta Harp, MD   100 mg at 01/22/23 1046   senna-docusate (Senokot-S) tablet 1 tablet  1 tablet Oral QHS PRN Amin, Ankit Chirag, MD       sodium chloride tablet 2 g  2 g Oral TID WC Andris Baumann, MD   2 g at 01/22/23 1259   thiamine (VITAMIN B1) tablet 100 mg  100 mg Oral Daily Pilar Jarvis, MD   100 mg at 01/22/23 8119   Or   thiamine (VITAMIN B1) injection 100 mg  100 mg Intravenous Daily Pilar Jarvis, MD       traZODone (DESYREL) tablet 50 mg  50 mg Oral QHS PRN Amin, Loura Halt, MD       vitamin B-12 (CYANOCOBALAMIN) tablet 100 mcg  100 mcg Oral Daily Lorretta Harp, MD   100 mcg at 01/22/23 1478   Current Outpatient Medications  Medication Sig Dispense Refill   acetaminophen (TYLENOL) 325 MG tablet Take 975 mg by mouth every 6 (six) hours as needed for mild pain.     albuterol (PROVENTIL HFA;VENTOLIN HFA) 108 (90 Base) MCG/ACT inhaler Inhale 1 puff into the lungs every 6 (six) hours as needed for wheezing or shortness of breath.     allopurinol (ZYLOPRIM) 100 MG tablet Take 200 mg by mouth daily.     apixaban (ELIQUIS) 5 MG TABS tablet Take 5 mg by mouth daily. Per Patient     carvedilol (COREG) 12.5 MG tablet Take 12.5 mg by mouth 2 (two) times daily with a meal.     cyanocobalamin 100 MCG tablet Take 1 tablet by mouth daily.     Emollient (EUCERIN) lotion Apply 1 Application topically as needed for dry skin.     guaiFENesin (MUCINEX) 600 MG 12 hr tablet Take 600 mg by mouth 2 (two) times daily.     magnesium oxide (MAG-OX) 400 MG tablet Take 400 mg by mouth 2 (two) times daily.     naltrexone (DEPADE) 50 MG tablet Take 50 mg by mouth daily.     omeprazole (PRILOSEC) 20 MG capsule Take 20 mg by mouth daily. Per Pt  pyridOXINE (VITAMIN B-6) 100 MG tablet Take 100 mg by mouth daily.     sodium chloride (OCEAN) 0.65 % SOLN nasal spray Place 1 spray into both nostrils as needed for congestion.     Tiotropium Bromide-Olodaterol  (STIOLTO RESPIMAT) 2.5-2.5 MCG/ACT AERS Inhale 2 each into the lungs daily.     vitamin B-12 (CYANOCOBALAMIN) 1000 MCG tablet Take 1,000 mcg by mouth daily.     alendronate (FOSAMAX) 70 MG tablet Take 70 mg by mouth once a week. (Patient not taking: Reported on 01/21/2023)     amLODipine (NORVASC) 5 MG tablet Take 5 mg by mouth daily. (Patient not taking: Reported on 12/02/2022)     aspirin 81 MG tablet Take 81 mg by mouth daily. (Patient not taking: Reported on 05/30/2022)     atorvastatin (LIPITOR) 80 MG tablet Take 40 mg by mouth daily. (Patient not taking: Reported on 01/21/2023)     cetirizine (ZYRTEC) 10 MG tablet TAKE ONE TABLET BY MOUTH EVERY DAY AS NEEDED FOR ITCHINESS (Patient not taking: Reported on 01/21/2023)     clopidogrel (PLAVIX) 75 MG tablet Take 75 mg by mouth daily. (Patient not taking: Reported on 12/02/2022)     colchicine 0.6 MG tablet 2 tablets stat, then 1 tablet tid until relief or stomach upset 30 tablet 0   diclofenac Sodium (VOLTAREN) 1 % GEL APPLY 4 GRAMS TOPICALLY EVERY 6 HOURS FOR BACK PAIN (Patient not taking: Reported on 01/21/2023)     furosemide (LASIX) 20 MG tablet Take 1 tablet (20 mg total) by mouth daily. 30 tablet 0   levETIRAcetam (KEPPRA) 500 MG tablet Take 1 tablet (500 mg total) by mouth 2 (two) times daily. (Patient not taking: Reported on 12/02/2022) 60 tablet 0   lisinopril (PRINIVIL,ZESTRIL) 40 MG tablet Take 40 mg by mouth daily. (Patient not taking: Reported on 12/02/2022)     melatonin 3 MG TABS tablet Take 3 mg by mouth at bedtime as needed (sleep). (Patient not taking: Reported on 01/21/2023)     metoprolol (TOPROL-XL) 200 MG 24 hr tablet Take 200 mg by mouth daily. (Patient not taking: Reported on 12/02/2022)     ondansetron (ZOFRAN-ODT) 4 MG disintegrating tablet Take 1 tablet (4 mg total) by mouth every 8 (eight) hours as needed for nausea or vomiting. (Patient not taking: Reported on 12/02/2022) 20 tablet 0   oxymetazoline (AFRIN) 0.05 % nasal spray Place 2  sprays into both nostrils as directed. (Patient not taking: Reported on 01/21/2023)     prednisoLONE acetate (PRED FORTE) 1 % ophthalmic suspension INSTILL 1 DROP IN RIGHT EYE FOUR TIMES A DAY (Patient not taking: Reported on 05/30/2022)     Specialty Vitamins Products (MAGNESIUM, AMINO ACID CHELATE,) 133 MG tablet Take 1 tablet by mouth 2 (two) times daily. (Patient not taking: Reported on 12/02/2022)     triamcinolone ointment (KENALOG) 0.5 % Apply 1 application topically 2 (two) times daily. (Patient not taking: Reported on 12/02/2022) 30 g 0      Allergies: Allergies  Allergen Reactions   Simvastatin    Empagliflozin Other (See Comments)    Other reaction(s): Tremor, Disorientated, Increased frequency of urination      Past Medical History: Past Medical History:  Diagnosis Date   CHF (congestive heart failure) (HCC)    Hypertension    S/P CABG x 3      Past Surgical History: Past Surgical History:  Procedure Laterality Date   ESOPHAGOGASTRODUODENOSCOPY (EGD) WITH PROPOFOL N/A 12/03/2022   Procedure: ESOPHAGOGASTRODUODENOSCOPY (EGD) WITH PROPOFOL;  Surgeon: Jaynie Collins, DO;  Location: [facility] ENDOSCOPY;  Service: Gastroenterology;  Laterality: N/A;     Family History: Family History  Problem Relation Age of Onset   Dementia Sister      Social History: Social History   Socioeconomic History   Marital status: Married    Spouse name: Not on file   Number of children: Not on file   Years of education: Not on file   Highest education level: Not on file  Occupational History   Not on file  Tobacco Use   Smoking status: Never   Smokeless tobacco: Never  Substance and Sexual Activity   Alcohol use: Yes   Drug use: No   Sexual activity: Not on file  Other Topics Concern   Not on file  Social History Narrative   Not on file   Social Determinants of Health   Financial Resource Strain: Not on file  Food Insecurity: No Food Insecurity (01/22/2023)   Hunger  Vital Sign    Worried About Running Out of Food in the Last Year: Never true    Ran Out of Food in the Last Year: Never true  Transportation Needs: No Transportation Needs (01/22/2023)   PRAPARE - Administrator, Civil Service (Medical): No    Lack of Transportation (Non-Medical): No  Physical Activity: Not on file  Stress: Not on file  Social Connections: Not on file  Intimate Partner Violence: Not At Risk (01/22/2023)   Humiliation, Afraid, Rape, and Kick questionnaire    Fear of Current or Ex-Partner: No    Emotionally Abused: No    Physically Abused: No    Sexually Abused: No     Review of Systems: Review of Systems  Constitutional:  Negative for chills, fever and malaise/fatigue.  HENT:  Negative for congestion, sore throat and tinnitus.   Eyes:  Negative for blurred vision and redness.  Respiratory:  Positive for shortness of breath. Negative for cough and wheezing.   Cardiovascular:  Negative for chest pain, palpitations, claudication and leg swelling.       Chest tightness  Gastrointestinal:  Negative for abdominal pain, blood in stool, diarrhea, nausea and vomiting.  Genitourinary:  Negative for flank pain, frequency and hematuria.  Musculoskeletal:  Negative for back pain, falls and myalgias.  Skin:  Negative for rash.  Neurological:  Negative for dizziness, weakness and headaches.  Endo/Heme/Allergies:  Does not bruise/bleed easily.  Psychiatric/Behavioral:  Negative for depression. The patient is not nervous/anxious and does not have insomnia.     Vital Signs: Blood pressure (!) 127/56, pulse 73, temperature 97.8 F (36.6 C), resp. rate 16, height 5' 10.75" (1.797 m), weight 75.8 kg, SpO2 99 %.  Weight trends: Filed Weights   01/21/23 1254  Weight: 75.8 kg    Physical Exam: General: NAD  Head: Normocephalic, atraumatic. Moist oral mucosal membranes  Eyes: Anicteric  Lungs:  Clear to auscultation, normal effort  Heart: Irregular  Abdomen:  Soft,  nontender  Extremities: 2+ peripheral edema.  Neurologic: Alert and oriented, moving all four extremities  Skin: No lesions  Access: None     Lab results: Basic Metabolic Panel: Recent Labs  Lab 01/21/23 1252 01/21/23 1550 01/22/23 0422  NA 118* 118* 119*  K 4.5 4.5 3.9  CL 90* 89* 88*  CO2 21* 22 23  GLUCOSE 133* 113* 111*  BUN 12 12 12   CREATININE 0.81 0.76 0.75  CALCIUM 8.4* 8.5* 8.5*  MG 1.6*  --  2.0  PHOS  2.5  --   --     Liver Function Tests: Recent Labs  Lab 01/21/23 1550  AST 31  ALT 16  ALKPHOS 131*  BILITOT 3.1*  PROT 7.0  ALBUMIN 3.0*   No results for input(s): "LIPASE", "AMYLASE" in the last 168 hours. Recent Labs  Lab 01/21/23 1550  AMMONIA 84*    CBC: Recent Labs  Lab 01/21/23 1252 01/22/23 0422  WBC 4.0 4.8  HGB 8.0* 8.7*  HCT 23.9* 25.4*  MCV 104.8* 102.0*  PLT 77* 92*    Cardiac Enzymes: No results for input(s): "CKTOTAL", "CKMB", "CKMBINDEX", "TROPONINI" in the last 168 hours.  BNP: Invalid input(s): "POCBNP"  CBG: No results for input(s): "GLUCAP" in the last 168 hours.  Microbiology: Results for orders placed or performed during the hospital encounter of 08/29/22  Resp panel by RT-PCR (RSV, Flu A&B, Covid) Anterior Nasal Swab     Status: None   Collection Time: 08/29/22  6:35 AM   Specimen: Anterior Nasal Swab  Result Value Ref Range Status   SARS Coronavirus 2 by RT PCR NEGATIVE NEGATIVE Final    Comment: (NOTE) SARS-CoV-2 target nucleic acids are NOT DETECTED.  The SARS-CoV-2 RNA is generally detectable in upper respiratory specimens during the acute phase of infection. The lowest concentration of SARS-CoV-2 viral copies this assay can detect is 138 copies/mL. A negative result does not preclude SARS-Cov-2 infection and should not be used as the sole basis for treatment or other patient management decisions. A negative result may occur with  improper specimen collection/handling, submission of specimen  other than nasopharyngeal swab, presence of viral mutation(s) within the areas targeted by this assay, and inadequate number of viral copies(<138 copies/mL). A negative result must be combined with clinical observations, patient history, and epidemiological information. The expected result is Negative.  Fact Sheet for Patients:  BloggerCourse.com  Fact Sheet for Healthcare Providers:  SeriousBroker.it  This test is no t yet approved or cleared by the [place] FDA and  has been authorized for detection and/or diagnosis of SARS-CoV-2 by FDA under an Emergency Use Authorization (EUA). This EUA will remain  in effect (meaning this test can be used) for the duration of the COVID-19 declaration under Section 564(b)(1) of the Act, 21 U.S.C.section 360bbb-3(b)(1), unless the authorization is terminated  or revoked sooner.       Influenza A by PCR NEGATIVE NEGATIVE Final   Influenza B by PCR NEGATIVE NEGATIVE Final    Comment: (NOTE) The Xpert Xpress SARS-CoV-2/FLU/RSV plus assay is intended as an aid in the diagnosis of influenza from Nasopharyngeal swab specimens and should not be used as a sole basis for treatment. Nasal washings and aspirates are unacceptable for Xpert Xpress SARS-CoV-2/FLU/RSV testing.  Fact Sheet for Patients: BloggerCourse.com  Fact Sheet for Healthcare Providers: SeriousBroker.it  This test is not yet approved or cleared by the [place] FDA and has been authorized for detection and/or diagnosis of SARS-CoV-2 by FDA under an Emergency Use Authorization (EUA). This EUA will remain in effect (meaning this test can be used) for the duration of the COVID-19 declaration under Section 564(b)(1) of the Act, 21 U.S.C. section 360bbb-3(b)(1), unless the authorization is terminated or revoked.     Resp Syncytial Virus by PCR NEGATIVE NEGATIVE Final     Comment: (NOTE) Fact Sheet for Patients: BloggerCourse.com  Fact Sheet for Healthcare Providers: SeriousBroker.it  This test is not yet approved or cleared by the [place] FDA and has been authorized for detection and/or diagnosis  of SARS-CoV-2 by FDA under an Emergency Use Authorization (EUA). This EUA will remain in effect (meaning this test can be used) for the duration of the COVID-19 declaration under Section 564(b)(1) of the Act, 21 U.S.C. section 360bbb-3(b)(1), unless the authorization is terminated or revoked.  Performed at [facility], [address] [place], [place] [address]   Blood culture (routine single)     Status: None   Collection Time: 08/29/22  7:37 AM   Specimen: BLOOD  Result Value Ref Range Status   Specimen Description BLOOD  LEFT [facility]  Final   Special Requests   Final    BOTTLES DRAWN AEROBIC AND ANAEROBIC Blood Culture adequate volume   Culture   Final    NO GROWTH 5 DAYS Performed at [facility], [address], [place], [place] [address]    Report Status 09/03/2022 FINAL  Final  Urine Culture     Status: None   Collection Time: 08/29/22  9:06 AM   Specimen: In/Out Cath Urine  Result Value Ref Range Status   Specimen Description   Final    IN/OUT CATH URINE Performed at [facility], [address], [place], [place] [address]    Special Requests   Final    NONE Performed at [facility], [address] [place], [place] [address]    Culture   Final    NO GROWTH Performed at [facility], [address] [place], [place] [address]    Report Status 08/30/2022 FINAL  Final    Coagulation Studies: Recent Labs    01/21/23 1550  LABPROT 28.6*  INR 2.7*    Urinalysis: No results for input(s): "COLORURINE", "LABSPEC", "PHURINE", "GLUCOSEU", "HGBUR", "BILIRUBINUR", "KETONESUR", "PROTEINUR", "UROBILINOGEN", "NITRITE", "LEUKOCYTESUR" in the  last 72 hours.  Invalid input(s): "APPERANCEUR"    Imaging: DG Chest 2 View  Result Date: 01/21/2023 CLINICAL DATA:  Chest pain, shortness of breath EXAM: CHEST - 2 VIEW COMPARISON:  12/02/2022 FINDINGS: Transverse diameter of heart is increased. Central pulmonary vessels are prominent. Increased interstitial markings are seen in the parahilar regions and lower lung fields. There is blunting of both lateral CP angles. There is no pneumothorax. Metallic sutures are seen in the sternum, possibly from previous coronary bypass surgery. IMPRESSION: Cardiomegaly. Increased interstitial markings are seen in both parahilar regions and lower lung fields suggesting interstitial pulmonary edema. Part of this finding may suggest underlying scarring. Small bilateral pleural effusions, more so on the right side. Electronically Signed   By: Ernie Avena M.D.   On: 01/21/2023 13:36     Assessment & Plan: Kenneth Stevenson is a 77 y.o.  male with  past medical conditions including GERD, hypertension, alcoholic liver cirrhosis, hyperlipidemia, CHF, CAD, and atrial fibrillation on Eliquis, who was admitted to [facility] on 01/21/2023 for Hyponatremia [E87.1] CHF exacerbation (HCC) [I50.9]  Hyponatremia likely secondary to increased free water and lack of oral intake.  Sodium on ED arrival 118.  Patient given IV furosemide 40 mg once.  Appears hypervolemic.  Will order furosemide drip at 5 mg/h.  Awaiting echo.  Will continue to monitor daily weights and urine output.    LOS: 1 Lakshmi Sundeen 5/30/20243:06 PM

## 2023-01-22 NOTE — Progress Notes (Signed)
Patient arrived to room 233 from ED.  Assessment complete, VS obtained, and Admission database began.

## 2023-01-22 NOTE — Progress Notes (Signed)
*  PRELIMINARY RESULTS* Echocardiogram 2D Echocardiogram has been performed.  Cristela Blue 01/22/2023, 10:45 AM

## 2023-01-22 NOTE — ED Notes (Signed)
Pt given a warm blanket at this time.

## 2023-01-22 NOTE — Progress Notes (Signed)
PROGRESS NOTE    Kenneth Stevenson  ZOX:[id number] DOB: March 21, 1946 DOA: 01/21/2023 [facility]CP: [facility]   Brief Narrative:  60 with history of alcohol abuse, cirrhosis, HTN, HLD, CAD status post CABG, unspecified CHF, A-fib on Eliquis, gout, GERD, thrombocytopenia admitted for shortness of breath.  Upon admission found to have some evidence of fluid overload and significant hyponatremia.  Admitting provider consulted nephrology.   Assessment & Plan:  Principal Problem:   CHF exacerbation (HCC) Active Problems:   Hyponatremia   Hyperlipidemia   Alcohol abuse   Alcoholic cirrhosis (HCC)   CAD (coronary artery disease)   Atrial fibrillation, chronic (HCC)   Essential hypertension   COPD (chronic obstructive pulmonary disease) (HCC)   Gout   Barrett's esophagus   Thrombocytopenia (HCC)   Hypomagnesemia   Macrocytic anemia       Elevated BNP, unspecified CHF -Patient has evidence of volume overload.  No echocardiogram available.  Looks like he gets most of his care at the [place].  Echocardiogram has been ordered here.  Received IV Lasix but currently awaiting urine labs to help determine further guidance in terms of his fluid   Hyponatremia:  -Sodium still remains at 119.  Currently awaiting urine studies to help further guide his therapy. I think he will benefit more from tolvaptan at this point. Will discuss with nephro   Hyperlipidemia:  -Noncompliant with Lipitor   Alcohol abuse -CIWA protocol, folic acid, multivitamin and thiamine -Continue home naltrexone 50 mg daily   Alcoholic cirrhosis (HCC):  -Elevated ammonia but normal mentation.  Start lactulose 20 g twice daily   CAD (coronary artery disease): No chest pain, patient's not -Observe closely   Atrial fibrillation, chronic (HCC): -Continue Coreg and Eliquis   Essential hypertension Coreg.  IV PRNs   COPD (chronic obstructive pulmonary disease) (HCC): No wheezing -bronchodilators   Gout -Allopurinol    Barrett's esophagus -Protonix   Thrombocytopenia (HCC):  -Appears to be chronic.  No evidence of bleeding. -Follow-up CBC   Hypomagnesemia: -As needed repletion   Macrocytic anemia: -Hb stable.     DVT prophylaxis: Eliquis Code Status: Full Family Communication:   Status is: Inpatient Ongoing management for fluid overload and hyponatremia       Diet Orders (From admission, onward)     Start     Ordered   01/21/23 1527  Diet regular Fluid consistency: Thin; Fluid restriction: 1200 mL Fluid  Diet effective now       Question Answer Comment  Fluid consistency: Thin   Fluid restriction: 1200 mL Fluid      01/21/23 1526            Subjective: Seen at bedside.  Admits of drinking 2-3 beers daily and occasional shot of liquor for at least 60 years.   Examination:  General exam: Appears calm and comfortable  Respiratory system: Clear to auscultation. Respiratory effort normal. Cardiovascular system: S1 & S2 heard, RRR. No JVD, murmurs, rubs, gallops or clicks. No pedal edema. Gastrointestinal system: Abdomen is nondistended, soft and nontender. No organomegaly or masses felt. Normal bowel sounds heard. Central nervous system: Alert and oriented. No focal neurological deficits. Extremities: Symmetric 5 x 5 power. Skin: Jaundiced Psychiatry: Judgement and insight appear normal. Mood & affect appropriate.  Objective: Vitals:   01/22/23 0600 01/22/23 0700 01/22/23 0800 01/22/23 0831  BP: 121/69 (!) 141/69 (!) 135/99 (!) 135/99  Pulse: 62 71 81 81  Resp: 16 14    Temp: (!) 97.4 F (36.3 C)  TempSrc: Oral     SpO2:  99% 98%   Weight:      Height:       No intake or output data in the 24 hours ending 01/22/23 0842 Filed Weights   01/21/23 1254  Weight: 75.8 kg    Scheduled Meds:  allopurinol  200 mg Oral Daily   apixaban  5 mg Oral BID   arformoterol  15 mcg Nebulization BID   And   umeclidinium bromide  1 puff Inhalation Daily   carvedilol  12.5  mg Oral BID WC   folic acid  1 mg Oral Daily   lactulose  20 g Oral BID   LORazepam  0-4 mg Intravenous Q6H   Followed by   Melene Muller ON 01/23/2023] LORazepam  0-4 mg Intravenous Q12H   magnesium oxide  400 mg Oral BID   multivitamin with minerals  1 tablet Oral Daily   naltrexone  50 mg Oral Daily   pantoprazole  40 mg Oral BID   pyridOXINE  100 mg Oral Daily   sodium chloride  2 g Oral TID WC   thiamine  100 mg Oral Daily   Or   thiamine  100 mg Intravenous Daily   cyanocobalamin  100 mcg Oral Daily   Continuous Infusions:  Nutritional status     Body mass index is 23.46 kg/m.  Data Reviewed:   CBC: Recent Labs  Lab 01/21/23 1252 01/22/23 0422  WBC 4.0 4.8  HGB 8.0* 8.7*  HCT 23.9* 25.4*  MCV 104.8* 102.0*  PLT 77* 92*   Basic Metabolic Panel: Recent Labs  Lab 01/21/23 1252 01/21/23 1550 01/22/23 0422  NA 118* 118* 119*  K 4.5 4.5 3.9  CL 90* 89* 88*  CO2 21* 22 23  GLUCOSE 133* 113* 111*  BUN 12 12 12   CREATININE 0.81 0.76 0.75  CALCIUM 8.4* 8.5* 8.5*  MG 1.6*  --   --   PHOS 2.5  --   --    GFR: Estimated Creatinine Clearance: 80.4 mL/min (by C-G formula based on SCr of 0.75 mg/dL). Liver Function Tests: Recent Labs  Lab 01/21/23 1550  AST 31  ALT 16  ALKPHOS 131*  BILITOT 3.1*  PROT 7.0  ALBUMIN 3.0*   No results for input(s): "LIPASE", "AMYLASE" in the last 168 hours. Recent Labs  Lab 01/21/23 1550  AMMONIA 84*   Coagulation Profile: Recent Labs  Lab 01/21/23 1550  INR 2.7*   Cardiac Enzymes: No results for input(s): "CKTOTAL", "CKMB", "CKMBINDEX", "TROPONINI" in the last 168 hours. BNP (last 3 results) No results for input(s): "PROBNP" in the last 8760 hours. HbA1C: No results for input(s): "HGBA1C" in the last 72 hours. CBG: No results for input(s): "GLUCAP" in the last 168 hours. Lipid Profile: No results for input(s): "CHOL", "HDL", "LDLCALC", "TRIG", "CHOLHDL", "LDLDIRECT" in the last 72 hours. Thyroid Function  Tests: No results for input(s): "TSH", "T4TOTAL", "FREET4", "T3FREE", "THYROIDAB" in the last 72 hours. Anemia Panel: No results for input(s): "VITAMINB12", "FOLATE", "FERRITIN", "TIBC", "IRON", "RETICCTPCT" in the last 72 hours. Sepsis Labs: No results for input(s): "PROCALCITON", "LATICACIDVEN" in the last 168 hours.  No results found for this or any previous visit (from the past 240 hour(s)).       Radiology Studies: DG Chest 2 View  Result Date: 01/21/2023 CLINICAL DATA:  Chest pain, shortness of breath EXAM: CHEST - 2 VIEW COMPARISON:  12/02/2022 FINDINGS: Transverse diameter of heart is increased. Central pulmonary vessels are prominent. Increased interstitial markings  are seen in the parahilar regions and lower lung fields. There is blunting of both lateral CP angles. There is no pneumothorax. Metallic sutures are seen in the sternum, possibly from previous coronary bypass surgery. IMPRESSION: Cardiomegaly. Increased interstitial markings are seen in both parahilar regions and lower lung fields suggesting interstitial pulmonary edema. Part of this finding may suggest underlying scarring. Small bilateral pleural effusions, more so on the right side. Electronically Signed   By: Ernie Avena M.D.   On: 01/21/2023 13:36           LOS: 1 day   Time spent= 35 mins    Dewanna Hurston Joline Maxcy, MD [facility]  If 7PM-7AM, please contact night-coverage  01/22/2023, 8:42 AM

## 2023-01-22 NOTE — Progress Notes (Signed)
       CROSS COVER NOTE  NAME: Kenneth Stevenson MRN: [id number] DOB : [birth year]    Concern from nurse Eyvonne Mechanic   Critical lab: Sodium 119 He is alert and oriented, scored a 1 on CIWA      Pertinent findings on chart review: Hyponatremia: Per H&P: Na 118, mental status is normal.  Most likely due to alcohol abuse and decreased oral intake.     01/22/2023    2:07 AM 01/22/2023   12:00 AM 01/21/2023   10:30 PM  Vitals with BMI  Systolic 121 120 132  Diastolic 69 55 52  Pulse 62 76 72      Latest Ref Rng & Units 01/22/2023    4:22 AM 01/21/2023    3:50 PM 01/21/2023   12:52 PM  BMP  Glucose 70 - 99 mg/dL 440  102  725   BUN 8 - 23 mg/dL 12  12  12    Creatinine 0.61 - 1.24 mg/dL 3.66  4.40  3.47   Sodium 135 - 145 mmol/L 119  118  118   Potassium 3.5 - 5.1 mmol/L 3.9  4.5  4.5   Chloride 98 - 111 mmol/L 88  89  90   CO2 22 - 32 mmol/L 23  22  21    Calcium 8.9 - 10.3 mg/dL 8.5  8.5  8.4      Assessment and  Interventions   Assessment:Critical hyponatremia on sodium chloride tablet  and fluid restriction  Plan: Nephrology consult for recommendations Increase NaCl tab to 2mg  tid, first dose now Continue fluid restriction pending renal consult

## 2023-01-23 DIAGNOSIS — I5033 Acute on chronic diastolic (congestive) heart failure: Secondary | ICD-10-CM | POA: Diagnosis not present

## 2023-01-23 LAB — BASIC METABOLIC PANEL
Anion gap: 7 (ref 5–15)
BUN: 10 mg/dL (ref 8–23)
CO2: 25 mmol/L (ref 22–32)
Calcium: 8.4 mg/dL — ABNORMAL LOW (ref 8.9–10.3)
Chloride: 92 mmol/L — ABNORMAL LOW (ref 98–111)
Creatinine, Ser: 0.73 mg/dL (ref 0.61–1.24)
GFR, Estimated: >60 mL/min (ref >60)
Glucose, Bld: 103 mg/dL — ABNORMAL HIGH (ref 70–99)
Potassium: 3.4 mmol/L — ABNORMAL LOW (ref 3.5–5.1)
Sodium: 124 mmol/L — ABNORMAL LOW (ref 135–145)

## 2023-01-23 LAB — CBC
HCT: 23.5 % — ABNORMAL LOW (ref 39.0–52.0)
Hemoglobin: 8.2 g/dL — ABNORMAL LOW (ref 13.0–17.0)
MCH: 35.3 pg — ABNORMAL HIGH (ref 26.0–34.0)
MCHC: 34.9 g/dL (ref 30.0–36.0)
MCV: 101.3 fL — ABNORMAL HIGH (ref 80.0–100.0)
Platelets: 85 10*3/uL — ABNORMAL LOW (ref 150–400)
RBC: 2.32 MIL/uL — ABNORMAL LOW (ref 4.22–5.81)
RDW: 16.7 % — ABNORMAL HIGH (ref 11.5–15.5)
WBC: 4.5 10*3/uL (ref 4.0–10.5)
nRBC: 0 % (ref 0.0–0.2)

## 2023-01-23 LAB — MAGNESIUM: Magnesium: 1.8 mg/dL (ref 1.7–2.4)

## 2023-01-23 MED ORDER — MAGNESIUM SULFATE 2 GM/50ML IV SOLN
2.0000 g | Freq: Once | INTRAVENOUS | Status: AC
  Administered 2023-01-23: 2 g via INTRAVENOUS
  Filled 2023-01-23: qty 50

## 2023-01-23 MED ORDER — OXYCODONE HCL 5 MG PO TABS: 5.0000 mg | ORAL_TABLET | Freq: Four times a day (QID) | ORAL | Status: DC | PRN

## 2023-01-23 MED ORDER — POTASSIUM CHLORIDE CRYS ER 20 MEQ PO TBCR
40.0000 meq | EXTENDED_RELEASE_TABLET | Freq: Two times a day (BID) | ORAL | Status: AC
  Administered 2023-01-23 (×2): 40 meq via ORAL
  Filled 2023-01-23 (×2): qty 2

## 2023-01-23 NOTE — TOC Initial Note (Addendum)
Transition of Care [facility]) - Initial/Assessment Note    Patient Details  Name: Kenneth Stevenson MRN: [id number] Date of Birth: 1946-07-29  Transition of Care [facility]) CM/SW Contact:    Margarito Liner, LCSW Phone Number: 01/23/2023, 11:21 AM  Clinical Narrative:   Readmission prevention screen complete. CSW met with patient. No supports at bedside. CSW introduced role and explained that discharge planning would be discussed. PCP is at the [facility]. Patient stated it is a team of providers. Daughter has recently been driving him to appointments. He uses the [place] pharmacy. No issues obtaining medications. Patient lives home with his wife and daughter. No home health services prior to admission. He has a rollator at home and a BSC/shower chair that he does not use. Patient is currently on acute oxygen. Will follow for this potential need as well. No further concerns. CSW encouraged patient to contact CSW as needed. CSW will continue to follow patient for support and facilitate return home once stable. His daughter will transport him home at discharge.               2:03 pm: PCP at the [place] is Rosanne Sack, MD.  Expected Discharge Plan: Home/Self Care Barriers to Discharge: Continued Medical Work up   Patient Goals and CMS Choice            Expected Discharge Plan and Services     Post Acute Care Choice: NA Living arrangements for the past 2 months: Single Family Home                                      Prior Living Arrangements/Services Living arrangements for the past 2 months: Single Family Home Lives with:: Adult Children, Spouse Patient language and need for interpreter reviewed:: Yes Do you feel safe going back to the place where you live?: Yes      Need for Family Participation in Patient Care: Yes (Comment) Care giver support system in place?: Yes (comment) Current home services: DME Criminal Activity/Legal Involvement Pertinent to Current Situation/Hospitalization: No -  Comment as needed  Activities of Daily Living Home Assistive Devices/Equipment: Cane (specify quad or straight), Walker (specify type) ADL Screening (condition at time of admission) Patient's cognitive ability adequate to safely complete daily activities?: Yes Is the patient deaf or have difficulty hearing?: No Does the patient have difficulty seeing, even when wearing glasses/contacts?: No Does the patient have difficulty concentrating, remembering, or making decisions?: No Patient able to express need for assistance with ADLs?: Yes Does the patient have difficulty dressing or bathing?: No Independently performs ADLs?: Yes (appropriate for developmental age) Does the patient have difficulty walking or climbing stairs?: No Weakness of Legs: None Weakness of Arms/Hands: None  Permission Sought/Granted                  Emotional Assessment Appearance:: Appears stated age Attitude/Demeanor/Rapport: Engaged, Gracious Affect (typically observed): Accepting, Appropriate, Calm, Pleasant Orientation: : Oriented to Self, Oriented to Place, Oriented to  Time, Oriented to Situation Alcohol / Substance Use: Not Applicable Psych Involvement: No (comment)  Admission diagnosis:  Hyponatremia [E87.1] CHF exacerbation (HCC) [I50.9] Acute on chronic congestive heart failure, unspecified heart failure type [facility]) [I50.9] Patient Active Problem List   Diagnosis Date Noted   Hyponatremia 01/21/2023   CHF exacerbation (HCC) 01/21/2023   Thrombocytopenia (HCC) 01/21/2023   Hypomagnesemia 01/21/2023   Macrocytic anemia 01/21/2023   Acute on  chronic heart failure (HCC) 12/02/2022   Melena 12/02/2022   Epistaxis 12/02/2022   Alcoholic cirrhosis (HCC) 12/02/2022   Seizure-like activity (HCC) 11/06/2022   Alcohol withdrawal seizure (HCC) 08/29/2022   Alcohol abuse 07/24/2021   Barrett's esophagus 07/24/2021   Other and unspecified angina pectoris 07/24/2021   Complete rotator cuff tear or  rupture of left shoulder, not specified as traumatic 07/24/2021   Cough, unspecified 07/24/2021   Dermatitis, unspecified 07/24/2021   Difficulty in walking, not elsewhere classified 07/24/2021   Dyspnea, unspecified 07/24/2021   Fracture of unspecified part of neck of unspecified femur, initial encounter for closed fracture (HCC) 07/24/2021   Gastroesophageal reflux disease without esophagitis 07/24/2021   Gout, unspecified 07/24/2021   Gout 07/24/2021   Hepatitis A 07/24/2021   Hypo-osmolality and hyponatremia 07/24/2021   Impotence of organic origin 07/24/2021   Inflammation of joint of right shoulder region 07/24/2021   Low back pain, unspecified 07/24/2021   Need for assistance with personal care 07/24/2021   Neoplasm of uncertain behavior of skin 07/24/2021   Nummular dermatitis 07/24/2021   Occlusion and stenosis of right carotid artery 07/24/2021   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   Osteoarthritis of bilateral glenohumeral joints 07/24/2021   Other abnormalities of gait and mobility 07/24/2021   Other acute postprocedural pain 07/24/2021   Other ill-defined and unknown causes of morbidity and mortality 07/24/2021   Other osteonecrosis, unspecified femur (HCC) 07/24/2021   Other secondary cataract, right eye 07/24/2021   Other specified abnormal findings of blood chemistry 07/24/2021   Pathological fracture, hip, unspecified, initial encounter for fracture (HCC) 07/24/2021   Personal history of noncompliance with medical treatment, presenting hazards to health 07/24/2021   Polymyalgia rheumatica (HCC) 07/24/2021   Pain in left hip 07/24/2021   Problem related to unspecified psychosocial circumstances 07/24/2021   Psychological and behavioral factors associated with disorders or diseases classified elsewhere 07/24/2021   Rotator cuff tear arthropathy 07/24/2021   Senile nuclear sclerosis 07/24/2021   Spondylosis without myelopathy or radiculopathy, lumbar region 07/24/2021   Syncope  and collapse 07/24/2021   Undiagnosed cardiac murmurs 07/24/2021   Unilateral primary osteoarthritis, left hip 07/24/2021   CAD (coronary artery disease) 07/31/2020   Atrial fibrillation, chronic (HCC) 07/31/2020   COPD (chronic obstructive pulmonary disease) (HCC) 07/31/2020   Hyperlipidemia 02/12/2010   Essential hypertension 02/12/2010   PCP:  [facility]:   [facility] [place], [place] - [address] [facility] [place] [address] Phone: [phone] Fax: [phone]  [id number] [place], [place] - [address] [place] [address] Phone: [phone] Fax: [phone]     Social Determinants of Health (SDOH) Social History: SDOH Screenings   Food Insecurity: No Food Insecurity (01/22/2023)  Housing: Low Risk  (01/22/2023)  Transportation Needs: No Transportation Needs (01/22/2023)  Utilities: Not At Risk (01/22/2023)  Tobacco Use: Low Risk  (01/22/2023)   SDOH Interventions:     Readmission Risk Interventions    01/23/2023   11:20 AM  Readmission Risk Prevention Plan  Transportation Screening Complete  Medication Review (RN Care Manager) Complete  PCP or Specialist appointment within 3-5 days of discharge Complete  SW Recovery Care/Counseling Consult Complete  Palliative Care Screening Not Applicable  Skilled Nursing Facility Not Applicable

## 2023-01-23 NOTE — Progress Notes (Signed)
[facility]  ROUNDING NOTE   Subjective:   Patient seen sitting up in bed, currently eating breakfast Alert and oriented States he feels better today Respiratory status has improved Nasal cannula hanging from 1 ear Lower extremity edema has also improved  Patient reports he can 2-12 ounce cans of beer daily, sometimes a 12 ounce and a 16 ounce can. Patient also states he drank 1-2 small glasses of scotch a few times a week.  Sodium 124 Urine output 1 L  Furosemide drip at 5 mg/h  Objective:  Vital signs in last 24 hours:  Temp:  [97.8 F (36.6 C)-98.2 F (36.8 C)] 97.8 F (36.6 C) (05/31 1225) Pulse Rate:  [67-76] 72 (05/31 1225) Resp:  [16-18] 18 (05/31 1225) BP: (100-127)/(50-67) 100/50 (05/31 1225) SpO2:  [97 %-100 %] 97 % (05/31 1225) FiO2 (%):  [28 %] 28 % (05/30 2133) Weight:  [79.1 kg-83.8 kg] 79.1 kg (05/31 0628)  Weight change: 8.049 kg Filed Weights   01/21/23 1254 01/22/23 1525 01/23/23 0628  Weight: 75.8 kg 83.8 kg 79.1 kg    Intake/Output: I/O last 3 completed shifts: In: 23.9 [I.V.:23.9] Out: 1000 [Urine:1000]   Intake/Output this shift:  Total I/O In: 240 [P.O.:240] Out: 950 [Urine:950]  Physical Exam: General: NAD  Head: Normocephalic, atraumatic. Moist oral mucosal membranes  Eyes: Anicteric  Lungs:  Clear to auscultation, room air  Heart: Regular rate and rhythm  Abdomen:  Soft, nontender,   Extremities:  1+ peripheral edema.  Neurologic: Nonfocal, moving all four extremities  Skin: No lesions  Access: None    Basic Metabolic Panel: Recent Labs  Lab 01/21/23 1252 01/21/23 1550 01/22/23 0422 01/22/23 1500 01/22/23 1950 01/23/23 0408  NA 118* 118* 119* 120* 121* 124*  K 4.5 4.5 3.9 4.1 4.1 3.4*  CL 90* 89* 88* 90* 91* 92*  CO2 21* 22 23 24 23 25   GLUCOSE 133* 113* 111* 121* 122* 103*  BUN 12 12 12 12 11 10   CREATININE 0.81 0.76 0.75 0.71 0.80 0.73  CALCIUM 8.4* 8.5* 8.5* 8.5* 8.3* 8.4*  MG 1.6*  --  2.0  --    --  1.8  PHOS 2.5  --   --   --   --   --     Liver Function Tests: Recent Labs  Lab 01/21/23 1550  AST 31  ALT 16  ALKPHOS 131*  BILITOT 3.1*  PROT 7.0  ALBUMIN 3.0*   No results for input(s): "LIPASE", "AMYLASE" in the last 168 hours. Recent Labs  Lab 01/21/23 1550  AMMONIA 84*    CBC: Recent Labs  Lab 01/21/23 1252 01/22/23 0422 01/23/23 0408  WBC 4.0 4.8 4.5  HGB 8.0* 8.7* 8.2*  HCT 23.9* 25.4* 23.5*  MCV 104.8* 102.0* 101.3*  PLT 77* 92* 85*    Cardiac Enzymes: No results for input(s): "CKTOTAL", "CKMB", "CKMBINDEX", "TROPONINI" in the last 168 hours.  BNP: Invalid input(s): "POCBNP"  CBG: No results for input(s): "GLUCAP" in the last 168 hours.  Microbiology: Results for orders placed or performed during the hospital encounter of 08/29/22  Resp panel by RT-PCR (RSV, Flu A&B, Covid) Anterior Nasal Swab     Status: None   Collection Time: 08/29/22  6:35 AM   Specimen: Anterior Nasal Swab  Result Value Ref Range Status   SARS Coronavirus 2 by RT PCR NEGATIVE NEGATIVE Final    Comment: (NOTE) SARS-CoV-2 target nucleic acids are NOT DETECTED.  The SARS-CoV-2 RNA is generally detectable in upper respiratory  specimens during the acute phase of infection. The lowest concentration of SARS-CoV-2 viral copies this assay can detect is 138 copies/mL. A negative result does not preclude SARS-Cov-2 infection and should not be used as the sole basis for treatment or other patient management decisions. A negative result may occur with  improper specimen collection/handling, submission of specimen other than nasopharyngeal swab, presence of viral mutation(s) within the areas targeted by this assay, and inadequate number of viral copies(<138 copies/mL). A negative result must be combined with clinical observations, patient history, and epidemiological information. The expected result is Negative.  Fact Sheet for Patients:   BloggerCourse.com  Fact Sheet for Healthcare Providers:  SeriousBroker.it  This test is no t yet approved or cleared by the [place] FDA and  has been authorized for detection and/or diagnosis of SARS-CoV-2 by FDA under an Emergency Use Authorization (EUA). This EUA will remain  in effect (meaning this test can be used) for the duration of the COVID-19 declaration under Section 564(b)(1) of the Act, 21 U.S.C.section 360bbb-3(b)(1), unless the authorization is terminated  or revoked sooner.       Influenza A by PCR NEGATIVE NEGATIVE Final   Influenza B by PCR NEGATIVE NEGATIVE Final    Comment: (NOTE) The Xpert Xpress SARS-CoV-2/FLU/RSV plus assay is intended as an aid in the diagnosis of influenza from Nasopharyngeal swab specimens and should not be used as a sole basis for treatment. Nasal washings and aspirates are unacceptable for Xpert Xpress SARS-CoV-2/FLU/RSV testing.  Fact Sheet for Patients: BloggerCourse.com  Fact Sheet for Healthcare Providers: SeriousBroker.it  This test is not yet approved or cleared by the [place] FDA and has been authorized for detection and/or diagnosis of SARS-CoV-2 by FDA under an Emergency Use Authorization (EUA). This EUA will remain in effect (meaning this test can be used) for the duration of the COVID-19 declaration under Section 564(b)(1) of the Act, 21 U.S.C. section 360bbb-3(b)(1), unless the authorization is terminated or revoked.     Resp Syncytial Virus by PCR NEGATIVE NEGATIVE Final    Comment: (NOTE) Fact Sheet for Patients: BloggerCourse.com  Fact Sheet for Healthcare Providers: SeriousBroker.it  This test is not yet approved or cleared by the [place] FDA and has been authorized for detection and/or diagnosis of SARS-CoV-2 by FDA under an Emergency Use  Authorization (EUA). This EUA will remain in effect (meaning this test can be used) for the duration of the COVID-19 declaration under Section 564(b)(1) of the Act, 21 U.S.C. section 360bbb-3(b)(1), unless the authorization is terminated or revoked.  Performed at [facility], [address] [place], [place] [address]   Blood culture (routine single)     Status: None   Collection Time: 08/29/22  7:37 AM   Specimen: BLOOD  Result Value Ref Range Status   Specimen Description BLOOD  LEFT [facility]  Final   Special Requests   Final    BOTTLES DRAWN AEROBIC AND ANAEROBIC Blood Culture adequate volume   Culture   Final    NO GROWTH 5 DAYS Performed at [facility], [address], [place], [place] [address]    Report Status 09/03/2022 FINAL  Final  Urine Culture     Status: None   Collection Time: 08/29/22  9:06 AM   Specimen: In/Out Cath Urine  Result Value Ref Range Status   Specimen Description   Final    IN/OUT CATH URINE Performed at [facility], [address], [place], [place] [address]    Special Requests  Final    NONE Performed at [facility], [address], [place], [place] [address]    Culture   Final    NO GROWTH Performed at [facility], [address] [place], [place] [address]    Report Status 08/30/2022 FINAL  Final    Coagulation Studies: Recent Labs    01/21/23 1550  LABPROT 28.6*  INR 2.7*    Urinalysis: No results for input(s): "COLORURINE", "LABSPEC", "PHURINE", "GLUCOSEU", "HGBUR", "BILIRUBINUR", "KETONESUR", "PROTEINUR", "UROBILINOGEN", "NITRITE", "LEUKOCYTESUR" in the last 72 hours.  Invalid input(s): "APPERANCEUR"    Imaging: ECHOCARDIOGRAM COMPLETE  Result Date: 01/22/2023    ECHOCARDIOGRAM REPORT   Patient Name:   Kenneth Stevenson Date of Exam: 01/22/2023 Medical Rec #:  [id number]     Height:       70.7 in Accession #:    [id number]    Weight:       167.0 lb Date of Birth:  [birth year]     BSA:           1.948 m Patient Age:    3 years      BP:           116/57 mmHg Patient Gender: M             HR:           62 bpm. Exam Location:  [facility] Procedure: 2D Echo, Color Doppler and Cardiac Doppler Indications:     CHF-acute diastolic I50.31  History:         Patient has no prior history of Echocardiogram examinations.                  CHF, Prior CABG; Risk Factors:Hypertension.  Sonographer:     Cristela Blue Referring Phys:  [id number] B[address] Few NIU Diagnosing Phys: Yvonne Kendall MD IMPRESSIONS  1. Left ventricular ejection fraction, by estimation, is 55 to 60%. The left ventricle has normal function. The left ventricle has no regional wall motion abnormalities. There is mild left ventricular hypertrophy. Left ventricular diastolic parameters were normal.  2. Right ventricular systolic function is moderately reduced. The right ventricular size is normal.  3. Left atrial size was mild to moderately dilated.  4. Right atrial size was mild to moderately dilated.  5. The mitral valve is degenerative. Mild to moderate mitral valve regurgitation. No evidence of mitral stenosis.  6. Tricuspid valve regurgitation is mild to moderate.  7. The aortic valve is tricuspid. There is mild calcification of the aortic valve. There is moderate thickening of the aortic valve. Aortic valve regurgitation is not visualized. Aortic valve sclerosis/calcification is present, without any evidence of aortic stenosis. FINDINGS  Left Ventricle: Left ventricular ejection fraction, by estimation, is 55 to 60%. The left ventricle has normal function. The left ventricle has no regional wall motion abnormalities. The left ventricular internal cavity size was normal in size. There is  mild left ventricular hypertrophy. Left ventricular diastolic parameters were normal. Right Ventricle: Pulmonary artery pressure is upper normal to mildly elevated (RVSP 30 mmHg plus central venous pressure). The right ventricular size is normal. No increase in right ventricular  wall thickness. Right ventricular systolic function is moderately reduced. Left Atrium: Left atrial size was mild to moderately dilated. Right Atrium: Right atrial size was mild to moderately dilated. Pericardium: The pericardium was not well visualized. Mitral Valve: The mitral valve is degenerative in appearance. There is mild thickening of the mitral valve leaflet(s). Mild to moderate mitral valve regurgitation. No evidence  of mitral valve stenosis. Tricuspid Valve: The tricuspid valve is normal in structure. Tricuspid valve regurgitation is mild to moderate. Aortic Valve: The aortic valve is tricuspid. There is mild calcification of the aortic valve. There is moderate thickening of the aortic valve. Aortic valve regurgitation is not visualized. Aortic valve sclerosis/calcification is present, without any evidence of aortic stenosis. Aortic valve mean gradient measures 5.0 mmHg. Aortic valve peak gradient measures 9.1 mmHg. Aortic valve area, by VTI measures 1.59 cm. Pulmonic Valve: The pulmonic valve was not well visualized. Pulmonic valve regurgitation is mild. Aorta: The aortic root is normal in size and structure. Pulmonary Artery: The pulmonary artery is not well seen. Venous: The inferior vena cava was not well visualized. IAS/Shunts: The interatrial septum was not well visualized.  LEFT VENTRICLE PLAX 2D LVIDd:         3.50 cm   Diastology LVIDs:         2.50 cm   LV e' medial:    9.36 cm/s LV PW:         1.12 cm   LV E/e' medial:  10.9 LV IVS:        1.20 cm   LV e' lateral:   13.20 cm/s LVOT diam:     2.00 cm   LV E/e' lateral: 7.7 LV SV:         49 LV SV Index:   25 LVOT Area:     3.14 cm  RIGHT VENTRICLE RV Basal diam:  5.12 cm RV Mid diam:    3.50 cm LEFT ATRIUM           Index        RIGHT ATRIUM           Index LA diam:      5.70 cm 2.93 cm/m   RA Area:     25.30 cm LA Vol (A2C): 85.8 ml 44.04 ml/m  RA Volume:   81.10 ml  41.63 ml/m LA Vol (A4C): 58.3 ml 29.92 ml/m  AORTIC VALVE AV Area  (Vmax):    1.51 cm AV Area (Vmean):   1.49 cm AV Area (VTI):     1.59 cm AV Vmax:           150.67 cm/s AV Vmean:          101.967 cm/s AV VTI:            0.306 m AV Peak Grad:      9.1 mmHg AV Mean Grad:      5.0 mmHg LVOT Vmax:         72.30 cm/s LVOT Vmean:        48.200 cm/s LVOT VTI:          0.155 m LVOT/AV VTI ratio: 0.51  AORTA Ao Root diam: 3.00 cm MITRAL VALVE                TRICUSPID VALVE MV Area (PHT): 4.21 cm     TR Peak grad:   29.4 mmHg MV Decel Time: 180 msec     TR Vmax:        271.00 cm/s MV E velocity: 102.00 cm/s MV A velocity: 47.70 cm/s   SHUNTS MV E/A ratio:  2.14         Systemic VTI:  0.16 m                             Systemic Diam: 2.00 cm  Yvonne Kendall MD Electronically signed by Yvonne Kendall MD Signature Date/Time: 01/22/2023/5:01:20 PM    Final      Medications:    furosemide (LASIX) 200 mg in dextrose 5 % 100 mL (2 mg/mL) infusion 5 mg/hr (01/22/23 1259)    allopurinol  200 mg Oral Daily   apixaban  5 mg Oral BID   arformoterol  15 mcg Nebulization BID   And   umeclidinium bromide  1 puff Inhalation Daily   carvedilol  12.5 mg Oral BID WC   folic acid  1 mg Oral Daily   lactulose  20 g Oral BID   LORazepam  0-4 mg Intravenous Q6H   Followed by   LORazepam  0-4 mg Intravenous Q12H   magnesium oxide  400 mg Oral BID   multivitamin with minerals  1 tablet Oral Daily   naltrexone  50 mg Oral Daily   pantoprazole  40 mg Oral BID   potassium chloride  40 mEq Oral BID   pyridOXINE  100 mg Oral Daily   sodium chloride  2 g Oral TID WC   thiamine  100 mg Oral Daily   Or   thiamine  100 mg Intravenous Daily   cyanocobalamin  100 mcg Oral Daily   acetaminophen, diphenhydrAMINE, guaiFENesin, hydrALAZINE, ipratropium-albuterol, LORazepam **OR** LORazepam, metoprolol tartrate, ondansetron (ZOFRAN) IV, oxyCODONE, polyvinyl alcohol, senna-docusate, traZODone  Assessment/ Plan:  Mr. Kenneth Stevenson is a 77 y.o.  male with  past medical conditions including GERD,  hypertension, alcoholic liver cirrhosis, hyperlipidemia, CHF, CAD, and atrial fibrillation on Eliquis, who was admitted to [facility] on 01/21/2023 for Hyponatremia [E87.1] CHF exacerbation (HCC) [I50.9] Acute on chronic congestive heart failure, unspecified heart failure type (HCC) [I50.9]   Hyponatremia likely secondary to increased free water and lack of oral intake. Sodium on ED arrival 118.   Sodium appears to be responding to furosemide drip at 5 mg/h Sodium 124 today with adequate urine output Echo unremarkable with EF 55 to 60% Will continue furosemide drip at this time      LOS: 2 Kenneth Stevenson 5/31/20241:33 PM

## 2023-01-23 NOTE — Progress Notes (Signed)
PROGRESS NOTE    Kenneth Stevenson  ZOX:[id number] DOB: 02-09-1946 DOA: 01/21/2023 PCP: [facility]   Brief Narrative:  77 with history of alcohol abuse, cirrhosis, HTN, HLD, CAD status post CABG, unspecified CHF, A-fib on Eliquis, gout, GERD, thrombocytopenia admitted for shortness of breath.  Upon admission found to have some evidence of fluid overload and significant hyponatremia.  Admitting provider consulted nephrology.  Per nephrology patient is currently on Lasix drip, sodium slowly improving.   Assessment & Plan:  Principal Problem:   CHF exacerbation (HCC) Active Problems:   Hyponatremia   Hyperlipidemia   Alcohol abuse   Alcoholic cirrhosis (HCC)   CAD (coronary artery disease)   Atrial fibrillation, chronic (HCC)   Essential hypertension   COPD (chronic obstructive pulmonary disease) (HCC)   Gout   Barrett's esophagus   Thrombocytopenia (HCC)   Hypomagnesemia   Macrocytic anemia       Acute congestive heart failure with preserved ejection fraction, 60% - Echocardiogram here shows dilated cardiomyopathy with preserved EF of 60%.  Patient is clearly volume overloaded.  Currently on IV Lasix drip.  Monitor input and output.  Hyponatremia:  - Nephrology team is following.  Admission sodium was 118 slowly improving, this morning 124.  Continue Lasix drip.  Continue to monitor urine output.  Nephrology recommending against tolvaptan given underlying liver disease   Hyperlipidemia:  -Noncompliant with Lipitor   Alcohol abuse -CIWA protocol, folic acid, multivitamin and thiamine -Continue home naltrexone 50 mg daily   Alcoholic cirrhosis (HCC):  -Elevated ammonia but normal mentation.  Continue lactulose twice daily   CAD (coronary artery disease): No chest pain, patient's not -Observe closely   Atrial fibrillation, chronic (HCC): -Continue Coreg and Eliquis   Essential hypertension Coreg.  IV PRNs   COPD (chronic obstructive pulmonary disease)  (HCC): No wheezing -bronchodilators   Gout -Allopurinol   Barrett's esophagus -Protonix   Thrombocytopenia (HCC):  -Appears to be chronic.  No evidence of bleeding. -Follow-up CBC   Hypomagnesemia: -As needed repletion   Macrocytic anemia: -Hb stable.    Out of bed to chair  DVT prophylaxis: Eliquis Code Status: Full Family Communication:   Status is: Inpatient Ongoing management for fluid overload on Lasix drip and hyponatremia      Diet Orders (From admission, onward)     Start     Ordered   01/21/23 1527  Diet regular Fluid consistency: Thin; Fluid restriction: 1200 mL Fluid  Diet effective now       Question Answer Comment  Fluid consistency: Thin   Fluid restriction: 1200 mL Fluid      01/21/23 1526            Subjective: Mild neck pain and discomfort.  No other complaints.  Tells me his shortness of breath is slightly better today.  Examination: Constitutional: Not in acute distress Respiratory: Bibasilar crackles Cardiovascular: Normal sinus rhythm, no rubs Abdomen: Nontender nondistended good bowel sounds Musculoskeletal: No edema noted Skin: No rashes seen Neurologic: CN 2-12 grossly intact.  And nonfocal Psychiatric: Normal judgment and insight. Alert and oriented x 3. Normal mood.   Objective: Vitals:   01/22/23 2355 01/23/23 0628 01/23/23 0748 01/23/23 0757  BP: (!) 117/56 119/63  122/66  Pulse: 76 71  75  Resp: 18 16  18   Temp: 98 F (36.7 C) 97.9 F (36.6 C)  98.2 F (36.8 C)  TempSrc:      SpO2: 99% 100% 99% 100%  Weight:  79.1 kg  Height:        Intake/Output Summary (Last 24 hours) at 01/23/2023 0845 Last data filed at 01/23/2023 0630 Gross per 24 hour  Intake 23.86 ml  Output 1000 ml  Net -976.14 ml   Filed Weights   01/21/23 1254 01/22/23 1525 01/23/23 0628  Weight: 75.8 kg 83.8 kg 79.1 kg    Scheduled Meds:  allopurinol  200 mg Oral Daily   apixaban  5 mg Oral BID   arformoterol  15 mcg Nebulization BID    And   umeclidinium bromide  1 puff Inhalation Daily   carvedilol  12.5 mg Oral BID WC   folic acid  1 mg Oral Daily   lactulose  20 g Oral BID   LORazepam  0-4 mg Intravenous Q6H   Followed by   LORazepam  0-4 mg Intravenous Q12H   magnesium oxide  400 mg Oral BID   multivitamin with minerals  1 tablet Oral Daily   naltrexone  50 mg Oral Daily   pantoprazole  40 mg Oral BID   potassium chloride  40 mEq Oral BID   pyridOXINE  100 mg Oral Daily   sodium chloride  2 g Oral TID WC   thiamine  100 mg Oral Daily   Or   thiamine  100 mg Intravenous Daily   cyanocobalamin  100 mcg Oral Daily   Continuous Infusions:  furosemide (LASIX) 200 mg in dextrose 5 % 100 mL (2 mg/mL) infusion 5 mg/hr (01/22/23 1259)   magnesium sulfate bolus IVPB      Nutritional status     Body mass index is 24.49 kg/m.  Data Reviewed:   CBC: Recent Labs  Lab 01/21/23 1252 01/22/23 0422 01/23/23 0408  WBC 4.0 4.8 4.5  HGB 8.0* 8.7* 8.2*  HCT 23.9* 25.4* 23.5*  MCV 104.8* 102.0* 101.3*  PLT 77* 92* 85*   Basic Metabolic Panel: Recent Labs  Lab 01/21/23 1252 01/21/23 1550 01/22/23 0422 01/22/23 1500 01/22/23 1950 01/23/23 0408  NA 118* 118* 119* 120* 121* 124*  K 4.5 4.5 3.9 4.1 4.1 3.4*  CL 90* 89* 88* 90* 91* 92*  CO2 21* 22 23 24 23 25   GLUCOSE 133* 113* 111* 121* 122* 103*  BUN 12 12 12 12 11 10   CREATININE 0.81 0.76 0.75 0.71 0.80 0.73  CALCIUM 8.4* 8.5* 8.5* 8.5* 8.3* 8.4*  MG 1.6*  --  2.0  --   --  1.8  PHOS 2.5  --   --   --   --   --    GFR: Estimated Creatinine Clearance: 80.4 mL/min (by C-G formula based on SCr of 0.73 mg/dL). Liver Function Tests: Recent Labs  Lab 01/21/23 1550  AST 31  ALT 16  ALKPHOS 131*  BILITOT 3.1*  PROT 7.0  ALBUMIN 3.0*   No results for input(s): "LIPASE", "AMYLASE" in the last 168 hours. Recent Labs  Lab 01/21/23 1550  AMMONIA 84*   Coagulation Profile: Recent Labs  Lab 01/21/23 1550  INR 2.7*   Cardiac Enzymes: No results  for input(s): "CKTOTAL", "CKMB", "CKMBINDEX", "TROPONINI" in the last 168 hours. BNP (last 3 results) No results for input(s): "PROBNP" in the last 8760 hours. HbA1C: No results for input(s): "HGBA1C" in the last 72 hours. CBG: No results for input(s): "GLUCAP" in the last 168 hours. Lipid Profile: No results for input(s): "CHOL", "HDL", "LDLCALC", "TRIG", "CHOLHDL", "LDLDIRECT" in the last 72 hours. Thyroid Function Tests: No results for input(s): "TSH", "T4TOTAL", "FREET4", "T3FREE", "THYROIDAB" in  the last 72 hours. Anemia Panel: No results for input(s): "VITAMINB12", "FOLATE", "FERRITIN", "TIBC", "IRON", "RETICCTPCT" in the last 72 hours. Sepsis Labs: No results for input(s): "PROCALCITON", "LATICACIDVEN" in the last 168 hours.  No results found for this or any previous visit (from the past 240 hour(s)).       Radiology Studies: ECHOCARDIOGRAM COMPLETE  Result Date: 01/22/2023    ECHOCARDIOGRAM REPORT   Patient Name:   BYRAN HOLDER Date of Exam: 01/22/2023 Medical Rec #:  [id number]     Height:       70.7 in Accession #:    [id number]    Weight:       167.0 lb Date of Birth:  Apr 07, 1946     BSA:          1.948 m Patient Age:    77 years      BP:           116/57 mmHg Patient Gender: M             HR:           62 bpm. Exam Location:  [facility] Procedure: 2D Echo, Color Doppler and Cardiac Doppler Indications:     CHF-acute diastolic I50.31  History:         Patient has no prior history of Echocardiogram examinations.                  CHF, Prior CABG; Risk Factors:Hypertension.  Sonographer:     Cristela Blue Referring Phys:  [id number] Brien Few NIU Diagnosing Phys: Yvonne Kendall MD IMPRESSIONS  1. Left ventricular ejection fraction, by estimation, is 55 to 60%. The left ventricle has normal function. The left ventricle has no regional wall motion abnormalities. There is mild left ventricular hypertrophy. Left ventricular diastolic parameters were normal.  2. Right ventricular systolic function is  moderately reduced. The right ventricular size is normal.  3. Left atrial size was mild to moderately dilated.  4. Right atrial size was mild to moderately dilated.  5. The mitral valve is degenerative. Mild to moderate mitral valve regurgitation. No evidence of mitral stenosis.  6. Tricuspid valve regurgitation is mild to moderate.  7. The aortic valve is tricuspid. There is mild calcification of the aortic valve. There is moderate thickening of the aortic valve. Aortic valve regurgitation is not visualized. Aortic valve sclerosis/calcification is present, without any evidence of aortic stenosis. FINDINGS  Left Ventricle: Left ventricular ejection fraction, by estimation, is 55 to 60%. The left ventricle has normal function. The left ventricle has no regional wall motion abnormalities. The left ventricular internal cavity size was normal in size. There is  mild left ventricular hypertrophy. Left ventricular diastolic parameters were normal. Right Ventricle: Pulmonary artery pressure is upper normal to mildly elevated (RVSP 30 mmHg plus central venous pressure). The right ventricular size is normal. No increase in right ventricular wall thickness. Right ventricular systolic function is moderately reduced. Left Atrium: Left atrial size was mild to moderately dilated. Right Atrium: Right atrial size was mild to moderately dilated. Pericardium: The pericardium was not well visualized. Mitral Valve: The mitral valve is degenerative in appearance. There is mild thickening of the mitral valve leaflet(s). Mild to moderate mitral valve regurgitation. No evidence of mitral valve stenosis. Tricuspid Valve: The tricuspid valve is normal in structure. Tricuspid valve regurgitation is mild to moderate. Aortic Valve: The aortic valve is tricuspid. There is mild calcification of the aortic valve. There is moderate thickening of the aortic valve. Aortic valve  regurgitation is not visualized. Aortic valve sclerosis/calcification is  present, without any evidence of aortic stenosis. Aortic valve mean gradient measures 5.0 mmHg. Aortic valve peak gradient measures 9.1 mmHg. Aortic valve area, by VTI measures 1.59 cm. Pulmonic Valve: The pulmonic valve was not well visualized. Pulmonic valve regurgitation is mild. Aorta: The aortic root is normal in size and structure. Pulmonary Artery: The pulmonary artery is not well seen. Venous: The inferior vena cava was not well visualized. IAS/Shunts: The interatrial septum was not well visualized.  LEFT VENTRICLE PLAX 2D LVIDd:         3.50 cm   Diastology LVIDs:         2.50 cm   LV e' medial:    9.36 cm/s LV PW:         1.12 cm   LV E/e' medial:  10.9 LV IVS:        1.20 cm   LV e' lateral:   13.20 cm/s LVOT diam:     2.00 cm   LV E/e' lateral: 7.7 LV SV:         49 LV SV Index:   25 LVOT Area:     3.14 cm  RIGHT VENTRICLE RV Basal diam:  5.12 cm RV Mid diam:    3.50 cm LEFT ATRIUM           Index        RIGHT ATRIUM           Index LA diam:      5.70 cm 2.93 cm/m   RA Area:     25.30 cm LA Vol (A2C): 85.8 ml 44.04 ml/m  RA Volume:   81.10 ml  41.63 ml/m LA Vol (A4C): 58.3 ml 29.92 ml/m  AORTIC VALVE AV Area (Vmax):    1.51 cm AV Area (Vmean):   1.49 cm AV Area (VTI):     1.59 cm AV Vmax:           150.67 cm/s AV Vmean:          101.967 cm/s AV VTI:            0.306 m AV Peak Grad:      9.1 mmHg AV Mean Grad:      5.0 mmHg LVOT Vmax:         72.30 cm/s LVOT Vmean:        48.200 cm/s LVOT VTI:          0.155 m LVOT/AV VTI ratio: 0.51  AORTA Ao Root diam: 3.00 cm MITRAL VALVE                TRICUSPID VALVE MV Area (PHT): 4.21 cm     TR Peak grad:   29.4 mmHg MV Decel Time: 180 msec     TR Vmax:        271.00 cm/s MV E velocity: 102.00 cm/s MV A velocity: 47.70 cm/s   SHUNTS MV E/A ratio:  2.14         Systemic VTI:  0.16 m                             Systemic Diam: 2.00 cm Yvonne Kendall MD Electronically signed by Yvonne Kendall MD Signature Date/Time: 01/22/2023/5:01:20 PM    Final    DG  Chest 2 View  Result Date: 01/21/2023 CLINICAL DATA:  Chest pain, shortness of breath EXAM: CHEST - 2 VIEW COMPARISON:  12/02/2022  FINDINGS: Transverse diameter of heart is increased. Central pulmonary vessels are prominent. Increased interstitial markings are seen in the parahilar regions and lower lung fields. There is blunting of both lateral CP angles. There is no pneumothorax. Metallic sutures are seen in the sternum, possibly from previous coronary bypass surgery. IMPRESSION: Cardiomegaly. Increased interstitial markings are seen in both parahilar regions and lower lung fields suggesting interstitial pulmonary edema. Part of this finding may suggest underlying scarring. Small bilateral pleural effusions, more so on the right side. Electronically Signed   By: Ernie Avena M.D.   On: 01/21/2023 13:36           LOS: 2 days   Time spent= 35 mins    Saraphina Lauderbaugh Joline Maxcy, MD [facility]  If 7PM-7AM, please contact night-coverage  01/23/2023, 8:45 AM

## 2023-01-23 NOTE — Consult Note (Addendum)
   Heart Failure Nurse Navigator Note  HFpEF 55-60%.  Mild LVH.  Right ventricular systolic function is moderately reduced.  Mild to moderate biatrial enlargement.  He presented to the emergency room with complaints of worsening shortness of breath.  He had also noted some swelling in his feet and legs.  BNP 559.  Chest x-ray cardiomegaly with interstitial pulmonary edema.  Had not been compliant with his Lasix.   Comorbidities:  Alcohol abuse Alcoholic liver cirrhosis Hypertension Hyperlipidemia Coronary artery disease/coronary artery bypass grafting Atrial fibrillation on Eliquis Gout GERD Thrombocytopenia  Medications:  Apixaban to 5 mg 2 times a day Carvedilol 12.5 mg 2 times a day with meals Furosemide infusion at 5 mg an hour  Labs:  Sodium 124, potassium 3.4, chloride 92, CO2 25, BUN 10, creatinine 0.73 Weight 79.1 kg Intake 23 mL Output  1000 mL  Initial meeting with patient who is lying quietly in bed in no acute distress stress.  He states that he lives at home with his wife and youngest daughter also lives with them to help take care of his wife who has Parkinson's.  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  Admits  to salt use with flavoring his food.  Explained the relationship between salt/sodium and fluids.  He states that they do eat at restaurants such as [employer], [employer] and [employer].  Went over making wise choices, what to avoid.  Also discussed the importance of sticking with the 64 ounce fluid restriction. Patient asks if he has beer also counts in  the fluid restriction.  Listed all the things that make up liquid, including things that melt at room temperature.  Discussed daily weights and what to report along with changes in symptoms.  Went over being compliant with his medications to help and avoid further admissions with heart failure.  Explained the outpatient heart failure clinic to the patient.  He states that he  follows at the [place] and does not wish to follow-up in the local clinic here.  He was given information about the clinic and told that he could call and make an appointment should he change his mind.  He was given the living with heart failure teaching booklet, zone magnet, info on low-sodium and heart failure with weight chart.  Tresa Endo RN [facility]

## 2023-01-23 NOTE — Consult Note (Addendum)
PHARMACY CONSULT NOTE  Pharmacy Consult for Electrolyte Monitoring and Replacement   Recent Labs: Potassium (mmol/L)  Date Value  01/23/2023 3.4 (L)  05/03/2013 4.0   Magnesium (mg/dL)  Date Value  16/05/9603 1.8   Calcium (mg/dL)  Date Value  54/04/8118 8.4 (L)   Calcium, Total (mg/dL)  Date Value  14/78/2956 9.1   Albumin (g/dL)  Date Value  21/30/8657 3.0 (L)   Phosphorus (mg/dL)  Date Value  84/69/6295 2.5   Sodium (mmol/L)  Date Value  01/23/2023 124 (L)  05/03/2013 133 (L)   Assessment: 77 y.o. male with PMH EtOH misuse, alcoholic cirrhosis, CAD, CABG, CHF, Afib on Eliquis, gout who presents with SOB requiring 2 L O2. Was recently hospitalized 4/9-4/20 for heart failure exacerbation and pt endorses non-adherence to furosemide as of late.  Pertinent meds: NaCl 2 g PO TIDM MagOx 400 mg BID Lasix gtt at 5 mg/hr  Diet: 1.2L fluid restriction  Goal of Therapy:  Electrolytes within normal limits  Plan:  --Na 124, trending up with diuresis, NaCl tablets, and fluid restriction --K 3.4, Kcl 40 mEq PO BID x 2 doses --Mg 1.8, continue MagOx 400 mg BID + magnesium sulfate 2 g IV x 1 --Follow-up electrolytes with AM labs tomorrow  Tressie Ellis 01/23/2023 7:51 AM

## 2023-01-23 NOTE — Plan of Care (Signed)
  Problem: Education: Goal: Ability to demonstrate management of disease process will improve Outcome: Progressing Goal: Ability to verbalize understanding of medication therapies will improve Outcome: Progressing Goal: Individualized Educational Video(s) Outcome: Progressing   Problem: Activity: Goal: Capacity to carry out activities will improve Outcome: Progressing   Problem: Cardiac: Goal: Ability to achieve and maintain adequate cardiopulmonary perfusion will improve Outcome: Progressing   Problem: Education: Goal: Knowledge of disease or condition will improve Outcome: Progressing Goal: Knowledge of the prescribed therapeutic regimen will improve Outcome: Progressing Goal: Individualized Educational Video(s) Outcome: Progressing   Problem: Activity: Goal: Ability to tolerate increased activity will improve Outcome: Progressing Goal: Will verbalize the importance of balancing activity with adequate rest periods Outcome: Progressing   Problem: Respiratory: Goal: Ability to maintain a clear airway will improve Outcome: Progressing Goal: Levels of oxygenation will improve Outcome: Progressing Goal: Ability to maintain adequate ventilation will improve Outcome: Progressing   Problem: Education: Goal: Knowledge of General Education information will improve Description Including pain rating scale, medication(s)/side effects and non-pharmacologic comfort measures Outcome: Progressing   Problem: Health Behavior/Discharge Planning: Goal: Ability to manage health-related needs will improve Outcome: Progressing   Problem: Clinical Measurements: Goal: Ability to maintain clinical measurements within normal limits will improve Outcome: Progressing Goal: Will remain free from infection Outcome: Progressing Goal: Diagnostic test results will improve Outcome: Progressing Goal: Respiratory complications will improve Outcome: Progressing Goal: Cardiovascular complication  will be avoided Outcome: Progressing   Problem: Activity: Goal: Risk for activity intolerance will decrease Outcome: Progressing   Problem: Nutrition: Goal: Adequate nutrition will be maintained Outcome: Progressing   Problem: Coping: Goal: Level of anxiety will decrease Outcome: Progressing   Problem: Elimination: Goal: Will not experience complications related to bowel motility Outcome: Progressing Goal: Will not experience complications related to urinary retention Outcome: Progressing   Problem: Pain Managment: Goal: General experience of comfort will improve Outcome: Progressing   Problem: Safety: Goal: Ability to remain free from injury will improve Outcome: Progressing   Problem: Skin Integrity: Goal: Risk for impaired skin integrity will decrease Outcome: Progressing   

## 2023-01-24 DIAGNOSIS — I5033 Acute on chronic diastolic (congestive) heart failure: Secondary | ICD-10-CM | POA: Diagnosis not present

## 2023-01-24 DIAGNOSIS — F1010 Alcohol abuse, uncomplicated: Secondary | ICD-10-CM | POA: Diagnosis not present

## 2023-01-24 DIAGNOSIS — E871 Hypo-osmolality and hyponatremia: Secondary | ICD-10-CM | POA: Diagnosis not present

## 2023-01-24 DIAGNOSIS — K7030 Alcoholic cirrhosis of liver without ascites: Secondary | ICD-10-CM | POA: Diagnosis not present

## 2023-01-24 LAB — CBC
HCT: 23.5 % — ABNORMAL LOW (ref 39.0–52.0)
Hemoglobin: 8.1 g/dL — ABNORMAL LOW (ref 13.0–17.0)
MCH: 35.2 pg — ABNORMAL HIGH (ref 26.0–34.0)
MCHC: 34.5 g/dL (ref 30.0–36.0)
MCV: 102.2 fL — ABNORMAL HIGH (ref 80.0–100.0)
Platelets: 88 10*3/uL — ABNORMAL LOW (ref 150–400)
RBC: 2.3 MIL/uL — ABNORMAL LOW (ref 4.22–5.81)
RDW: 16.6 % — ABNORMAL HIGH (ref 11.5–15.5)
WBC: 5.1 10*3/uL (ref 4.0–10.5)
nRBC: 0 % (ref 0.0–0.2)

## 2023-01-24 LAB — BASIC METABOLIC PANEL
Anion gap: 8 (ref 5–15)
BUN: 9 mg/dL (ref 8–23)
CO2: 28 mmol/L (ref 22–32)
Calcium: 8.3 mg/dL — ABNORMAL LOW (ref 8.9–10.3)
Chloride: 90 mmol/L — ABNORMAL LOW (ref 98–111)
Creatinine, Ser: 0.7 mg/dL (ref 0.61–1.24)
GFR, Estimated: >60 mL/min (ref >60)
Glucose, Bld: 107 mg/dL — ABNORMAL HIGH (ref 70–99)
Potassium: 3.4 mmol/L — ABNORMAL LOW (ref 3.5–5.1)
Sodium: 126 mmol/L — ABNORMAL LOW (ref 135–145)

## 2023-01-24 LAB — MAGNESIUM: Magnesium: 1.7 mg/dL (ref 1.7–2.4)

## 2023-01-24 LAB — OSMOLALITY, URINE: Osmolality, Ur: 334 mOsm/kg (ref 300–900)

## 2023-01-24 LAB — SODIUM, URINE, RANDOM: Sodium, Ur: 60 mmol/L

## 2023-01-24 MED ORDER — MAGNESIUM SULFATE 2 GM/50ML IV SOLN
2.0000 g | Freq: Once | INTRAVENOUS | Status: AC
  Administered 2023-01-24: 2 g via INTRAVENOUS
  Filled 2023-01-24: qty 50

## 2023-01-24 MED ORDER — POTASSIUM CHLORIDE CRYS ER 20 MEQ PO TBCR
40.0000 meq | EXTENDED_RELEASE_TABLET | Freq: Once | ORAL | Status: AC
  Administered 2023-01-24: 40 meq via ORAL
  Filled 2023-01-24: qty 2

## 2023-01-24 MED ORDER — SALINE SPRAY 0.65 % NA SOLN
1.0000 | NASAL | Status: DC | PRN
  Filled 2023-01-24: qty 44

## 2023-01-24 NOTE — Progress Notes (Signed)
[facility]  ROUNDING NOTE   Subjective:   Furosemide gtt 5mg /hr UOP .  Patient states he is feeling better than yesterday.   Objective:  Vital signs in last 24 hours:  Temp:  [97.6 F (36.4 C)-98.5 F (36.9 C)] 98.5 F (36.9 C) (06/01 1148) Pulse Rate:  [63-84] 63 (06/01 1148) Resp:  [18-20] 20 (06/01 1148) BP: (95-117)/(49-65) 101/52 (06/01 1148) SpO2:  [86 %-98 %] 98 % (06/01 1148) Weight:  [77.8 kg] 77.8 kg (06/01 0530)  Weight change: -6 kg Filed Weights   01/22/23 1525 01/23/23 0628 01/24/23 0530  Weight: 83.8 kg 79.1 kg 77.8 kg    Intake/Output: I/O last 3 completed shifts: In: 342.6 [P.O.:240; I.V.:52.6; IV Piggyback:50] Out: 3550 [Urine:3550]   Intake/Output this shift:  Total I/O In: 240 [P.O.:240] Out: -   Physical Exam: General: NAD, laying in bed  Head: Normocephalic, atraumatic. Moist oral mucosal membranes  Eyes: Anicteric  Lungs:  Clear to auscultation, room air  Heart: Regular rate and rhythm  Abdomen:  Soft, nontender,   Extremities:  trace peripheral edema.  Neurologic: Nonfocal, moving all four extremities  Skin: No lesions        Basic Metabolic Panel: Recent Labs  Lab 01/21/23 1252 01/21/23 1550 01/22/23 0422 01/22/23 1500 01/22/23 1950 01/23/23 0408 01/24/23 0425  NA 118*   < > 119* 120* 121* 124* 126*  K 4.5   < > 3.9 4.1 4.1 3.4* 3.4*  CL 90*   < > 88* 90* 91* 92* 90*  CO2 21*   < > 23 24 23 25 28   GLUCOSE 133*   < > 111* 121* 122* 103* 107*  BUN 12   < > 12 12 11 10 9   CREATININE 0.81   < > 0.75 0.71 0.80 0.73 0.70  CALCIUM 8.4*   < > 8.5* 8.5* 8.3* 8.4* 8.3*  MG 1.6*  --  2.0  --   --  1.8 1.7  PHOS 2.5  --   --   --   --   --   --    < > = values in this interval not displayed.     Liver Function Tests: Recent Labs  Lab 01/21/23 1550  AST 31  ALT 16  ALKPHOS 131*  BILITOT 3.1*  PROT 7.0  ALBUMIN 3.0*    No results for input(s): "LIPASE", "AMYLASE" in the last 168 hours. Recent Labs   Lab 01/21/23 1550  AMMONIA 84*     CBC: Recent Labs  Lab 01/21/23 1252 01/22/23 0422 01/23/23 0408 01/24/23 0425  WBC 4.0 4.8 4.5 5.1  HGB 8.0* 8.7* 8.2* 8.1*  HCT 23.9* 25.4* 23.5* 23.5*  MCV 104.8* 102.0* 101.3* 102.2*  PLT 77* 92* 85* 88*     Cardiac Enzymes: No results for input(s): "CKTOTAL", "CKMB", "CKMBINDEX", "TROPONINI" in the last 168 hours.  BNP: Invalid input(s): "POCBNP"  CBG: No results for input(s): "GLUCAP" in the last 168 hours.  Microbiology: Results for orders placed or performed during the hospital encounter of 08/29/22  Resp panel by RT-PCR (RSV, Flu A&B, Covid) Anterior Nasal Swab     Status: None   Collection Time: 08/29/22  6:35 AM   Specimen: Anterior Nasal Swab  Result Value Ref Range Status   SARS Coronavirus 2 by RT PCR NEGATIVE NEGATIVE Final    Comment: (NOTE) SARS-CoV-2 target nucleic acids are NOT DETECTED.  The SARS-CoV-2 RNA is generally detectable in upper respiratory specimens during the acute phase of infection. The lowest  concentration of SARS-CoV-2 viral copies this assay can detect is 138 copies/mL. A negative result does not preclude SARS-Cov-2 infection and should not be used as the sole basis for treatment or other patient management decisions. A negative result may occur with  improper specimen collection/handling, submission of specimen other than nasopharyngeal swab, presence of viral mutation(s) within the areas targeted by this assay, and inadequate number of viral copies(<138 copies/mL). A negative result must be combined with clinical observations, patient history, and epidemiological information. The expected result is Negative.  Fact Sheet for Patients:  BloggerCourse.com  Fact Sheet for Healthcare Providers:  SeriousBroker.it  This test is no t yet approved or cleared by the [place] FDA and  has been authorized for detection and/or diagnosis of  SARS-CoV-2 by FDA under an Emergency Use Authorization (EUA). This EUA will remain  in effect (meaning this test can be used) for the duration of the COVID-19 declaration under Section 564(b)(1) of the Act, 21 U.S.C.section 360bbb-3(b)(1), unless the authorization is terminated  or revoked sooner.       Influenza A by PCR NEGATIVE NEGATIVE Final   Influenza B by PCR NEGATIVE NEGATIVE Final    Comment: (NOTE) The Xpert Xpress SARS-CoV-2/FLU/RSV plus assay is intended as an aid in the diagnosis of influenza from Nasopharyngeal swab specimens and should not be used as a sole basis for treatment. Nasal washings and aspirates are unacceptable for Xpert Xpress SARS-CoV-2/FLU/RSV testing.  Fact Sheet for Patients: BloggerCourse.com  Fact Sheet for Healthcare Providers: SeriousBroker.it  This test is not yet approved or cleared by the [place] FDA and has been authorized for detection and/or diagnosis of SARS-CoV-2 by FDA under an Emergency Use Authorization (EUA). This EUA will remain in effect (meaning this test can be used) for the duration of the COVID-19 declaration under Section 564(b)(1) of the Act, 21 U.S.C. section 360bbb-3(b)(1), unless the authorization is terminated or revoked.     Resp Syncytial Virus by PCR NEGATIVE NEGATIVE Final    Comment: (NOTE) Fact Sheet for Patients: BloggerCourse.com  Fact Sheet for Healthcare Providers: SeriousBroker.it  This test is not yet approved or cleared by the [place] FDA and has been authorized for detection and/or diagnosis of SARS-CoV-2 by FDA under an Emergency Use Authorization (EUA). This EUA will remain in effect (meaning this test can be used) for the duration of the COVID-19 declaration under Section 564(b)(1) of the Act, 21 U.S.C. section 360bbb-3(b)(1), unless the authorization is terminated  or revoked.  Performed at [facility], [address] [place], [place] [address]   Blood culture (routine single)     Status: None   Collection Time: 08/29/22  7:37 AM   Specimen: BLOOD  Result Value Ref Range Status   Specimen Description BLOOD  LEFT [facility]  Final   Special Requests   Final    BOTTLES DRAWN AEROBIC AND ANAEROBIC Blood Culture adequate volume   Culture   Final    NO GROWTH 5 DAYS Performed at [facility], [address], [place], [place] [address]    Report Status 09/03/2022 FINAL  Final  Urine Culture     Status: None   Collection Time: 08/29/22  9:06 AM   Specimen: In/Out Cath Urine  Result Value Ref Range Status   Specimen Description   Final    IN/OUT CATH URINE Performed at [facility], [address], [place], [place] [address]    Special Requests   Final    NONE Performed at  [facility], [address], [place], [place] [address]    Culture   Final    NO GROWTH Performed at [facility], [address] [place], [place] [address]    Report Status 08/30/2022 FINAL  Final    Coagulation Studies: Recent Labs    01/21/23 1550  LABPROT 28.6*  INR 2.7*     Urinalysis: No results for input(s): "COLORURINE", "LABSPEC", "PHURINE", "GLUCOSEU", "HGBUR", "BILIRUBINUR", "KETONESUR", "PROTEINUR", "UROBILINOGEN", "NITRITE", "LEUKOCYTESUR" in the last 72 hours.  Invalid input(s): "APPERANCEUR"    Imaging: No results found.   Medications:    furosemide (LASIX) 200 mg in dextrose 5 % 100 mL (2 mg/mL) infusion 5 mg/hr (01/22/23 1259)    allopurinol  200 mg Oral Daily   apixaban  5 mg Oral BID   arformoterol  15 mcg Nebulization BID   And   umeclidinium bromide  1 puff Inhalation Daily   carvedilol  12.5 mg Oral BID WC   folic acid  1 mg Oral Daily   lactulose  20 g Oral BID   LORazepam  0-4 mg Intravenous Q12H   magnesium oxide  400 mg Oral BID   multivitamin with minerals  1 tablet Oral Daily    naltrexone  50 mg Oral Daily   pantoprazole  40 mg Oral BID   potassium chloride  40 mEq Oral Once   pyridOXINE  100 mg Oral Daily   sodium chloride  2 g Oral TID WC   thiamine  100 mg Oral Daily   cyanocobalamin  100 mcg Oral Daily   acetaminophen, diphenhydrAMINE, guaiFENesin, hydrALAZINE, ipratropium-albuterol, LORazepam **OR** LORazepam, metoprolol tartrate, ondansetron (ZOFRAN) IV, oxyCODONE, polyvinyl alcohol, senna-docusate, sodium chloride, traZODone  Assessment/ Plan:  Mr. Kenneth Stevenson is a 77 y.o.  male with  past medical conditions including GERD, hypertension, alcoholic liver cirrhosis, hyperlipidemia, CHF, CAD, and atrial fibrillation on Eliquis, who was admitted to [facility] on 01/21/2023 for Hyponatremia [E87.1] CHF exacerbation (HCC) [I50.9] Acute on chronic congestive heart failure, unspecified heart failure type (HCC) [I50.9]   Hyponatremia: hypervolemic - Continue loop diuretic infusion.  - torsemide 20mg  daily on discharge.  - patient to stop drinking alcohol.   Hypokalemia: secondary to loop diuretic.  - PO potassium chloride  Hypertension: on carvedilol. Holding home lisinopril.   Anemia : macrocytic. Secondary to alcohol abuse. On folic acid. Needs full anemia work up.       LOS: 3 Kenneth Stevenson 6/1/20243:07 PM

## 2023-01-24 NOTE — Consult Note (Signed)
PHARMACY CONSULT NOTE  Pharmacy Consult for Electrolyte Monitoring and Replacement   Recent Labs: Potassium (mmol/L)  Date Value  01/24/2023 3.4 (L)  05/03/2013 4.0   Magnesium (mg/dL)  Date Value  16/05/9603 1.7   Calcium (mg/dL)  Date Value  54/04/8118 8.3 (L)   Calcium, Total (mg/dL)  Date Value  14/78/2956 9.1   Albumin (g/dL)  Date Value  21/30/8657 3.0 (L)   Phosphorus (mg/dL)  Date Value  84/69/6295 2.5   Sodium (mmol/L)  Date Value  01/24/2023 126 (L)  05/03/2013 133 (L)   Assessment: 77 y.o. male with PMH EtOH misuse, alcoholic cirrhosis, CAD, CABG, CHF, Afib on Eliquis, gout who presents with SOB requiring 2 L O2. Was recently hospitalized 4/9-4/20 for heart failure exacerbation and pt endorses non-adherence to furosemide as of late.  Pertinent current meds: NaCl 2 g PO TIDM MagOx 400 mg BID Lasix gtt at 5 mg/hr  Diet: 1.2L fluid restriction  Goal of Therapy:  Electrolytes within normal limits  Plan:  --Na 124>126 over 24 hrs, trending up with diuresis, NaCl tablets, and fluid restriction --K 3.4, Kcl 40 mEq PO x1 in am and x1 in pm --Mg 1.7,  will order magnesium sulfate 2 g IV x 1 and continue MagOx 400 mg BID --Follow-up electrolytes with AM labs tomorrow  Shalisha Clausing A 01/24/2023 8:47 AM

## 2023-01-24 NOTE — Progress Notes (Signed)
[facility]  - [place] [facility]   PATIENT NAME: Kenneth Stevenson    MR#:  [id number]  DATE OF BIRTH:  Aug 21, 1946  SUBJECTIVE:  no family bedside. Overall doing well. Good urine output. Denies any abdominal pain. Discussed about abstaining from drinking alcohol. Patient voice understanding.    VITALS:  Blood pressure (!) 101/52, pulse 63, temperature 98.5 F (36.9 C), temperature source Oral, resp. rate 20, height 5' 10.75" (1.797 m), weight 77.8 kg, SpO2 98 %.  PHYSICAL EXAMINATION:   GENERAL:  77 y.o.-year-old patient with no acute distress.  LUNGS: Normal breath sounds bilaterally, no wheezing CARDIOVASCULAR: S1, S2 normal. No murmur   ABDOMEN: Soft, nontender, nondistended. EXTREMITIES: Trace+ edema b/l.    NEUROLOGIC: nonfocal  patient is alert and awake  LABORATORY PANEL:  CBC Recent Labs  Lab 01/24/23 0425  WBC 5.1  HGB 8.1*  HCT 23.5*  PLT 88*    Chemistries  Recent Labs  Lab 01/21/23 1550 01/22/23 0422 01/24/23 0425  NA 118*   < > 126*  K 4.5   < > 3.4*  CL 89*   < > 90*  CO2 22   < > 28  GLUCOSE 113*   < > 107*  BUN 12   < > 9  CREATININE 0.76   < > 0.70  CALCIUM 8.5*   < > 8.3*  MG  --    < > 1.7  AST 31  --   --   ALT 16  --   --   ALKPHOS 131*  --   --   BILITOT 3.1*  --   --    < > = values in this interval not displayed.    Assessment and Plan  77 with history of alcohol abuse, cirrhosis, HTN, HLD, CAD status post CABG, unspecified CHF, A-fib on Eliquis, gout, GERD, thrombocytopenia admitted for shortness of breath.  Upon admission found to have some evidence of fluid overload and significant hyponatremia.  Admitting provider consulted nephrology.  Per nephrology patient is currently on Lasix drip, sodium slowly improving.   Acute congestive heart failure with preserved ejection fraction, 60% - Echocardiogram here shows dilated cardiomyopathy with preserved EF of 60%.  Patient is clearly volume overloaded.  Currently on  IV Lasix drip.   --UOP ~3500 ml   Hyponatremia:  - Nephrology team is following.  Admission sodium was 118 slowly improving, this morning 126.  Continue Lasix drip.  Continue to monitor urine output.  Nephrology recommending against tolvaptan given underlying liver disease   Hyperlipidemia:  -Noncompliant with Lipitor   Alcohol abuse -CIWA protocol, folic acid, multivitamin and thiamine -Continue home naltrexone 50 mg daily   Alcoholic cirrhosis (HCC):  -Elevated ammonia but normal mentation.  Continue lactulose twice daily   CAD (coronary artery disease): No chest pain, patient's not -Observe closely   Atrial fibrillation, chronic (HCC): -Continue Coreg and Eliquis   Essential hypertension Coreg.  IV PRNs   COPD (chronic obstructive pulmonary disease) (HCC): No wheezing -bronchodilators   Gout -Allopurinol   Barrett's esophagus -Protonix   Thrombocytopenia (HCC):  -Appears to be chronic.  No evidence of bleeding. -Follow-up CBC   Hypomagnesemia: -As needed repletion   Macrocytic anemia: -Hb stable.    Out of bed to chair MT to ambulate pt   DVT prophylaxis: Eliquis Code Status: Full Family Communication:   Status is: Inpatient Ongoing management for fluid overload on Lasix drip and hyponatremia If remains stable likley d/c 1-2 day Level of  care: Progressive    TOTAL TIME TAKING CARE OF THIS PATIENT: 35 minutes.  >50% time spent on counselling and coordination of care  Note: This dictation was prepared with Dragon dictation along with smaller phrase technology. Any transcriptional errors that result from this process are unintentional.  [name] M.D    [facility]   CC: Primary care physician; Center, [facility]

## 2023-01-25 DIAGNOSIS — I482 Chronic atrial fibrillation, unspecified: Secondary | ICD-10-CM | POA: Diagnosis not present

## 2023-01-25 DIAGNOSIS — E871 Hypo-osmolality and hyponatremia: Secondary | ICD-10-CM | POA: Diagnosis not present

## 2023-01-25 DIAGNOSIS — I509 Heart failure, unspecified: Secondary | ICD-10-CM | POA: Diagnosis not present

## 2023-01-25 DIAGNOSIS — K7030 Alcoholic cirrhosis of liver without ascites: Secondary | ICD-10-CM | POA: Diagnosis not present

## 2023-01-25 LAB — BASIC METABOLIC PANEL
Anion gap: 8 (ref 5–15)
BUN: 10 mg/dL (ref 8–23)
CO2: 28 mmol/L (ref 22–32)
Calcium: 8.5 mg/dL — ABNORMAL LOW (ref 8.9–10.3)
Chloride: 93 mmol/L — ABNORMAL LOW (ref 98–111)
Creatinine, Ser: 0.86 mg/dL (ref 0.61–1.24)
GFR, Estimated: >60 mL/min (ref >60)
Glucose, Bld: 98 mg/dL (ref 70–99)
Potassium: 3.5 mmol/L (ref 3.5–5.1)
Sodium: 129 mmol/L — ABNORMAL LOW (ref 135–145)

## 2023-01-25 LAB — MAGNESIUM: Magnesium: 1.9 mg/dL (ref 1.7–2.4)

## 2023-01-25 LAB — CBC
HCT: 23.7 % — ABNORMAL LOW (ref 39.0–52.0)
Hemoglobin: 8 g/dL — ABNORMAL LOW (ref 13.0–17.0)
MCH: 34.9 pg — ABNORMAL HIGH (ref 26.0–34.0)
MCHC: 33.8 g/dL (ref 30.0–36.0)
MCV: 103.5 fL — ABNORMAL HIGH (ref 80.0–100.0)
Platelets: 89 10*3/uL — ABNORMAL LOW (ref 150–400)
RBC: 2.29 MIL/uL — ABNORMAL LOW (ref 4.22–5.81)
RDW: 17.2 % — ABNORMAL HIGH (ref 11.5–15.5)
WBC: 4.8 10*3/uL (ref 4.0–10.5)
nRBC: 0 % (ref 0.0–0.2)

## 2023-01-25 MED ORDER — FOLIC ACID 1 MG PO TABS: 1.0000 mg | ORAL_TABLET | Freq: Every day | ORAL | 1 refills | Status: AC

## 2023-01-25 MED ORDER — LACTULOSE 10 GM/15ML PO SOLN: 20.0000 g | Freq: Two times a day (BID) | ORAL | 0 refills | Status: AC

## 2023-01-25 MED ORDER — TORSEMIDE 20 MG PO TABS
20.0000 mg | ORAL_TABLET | Freq: Every day | ORAL | Status: DC
  Administered 2023-01-25: 20 mg via ORAL
  Filled 2023-01-25: qty 1

## 2023-01-25 MED ORDER — TORSEMIDE 20 MG PO TABS: 20.0000 mg | ORAL_TABLET | Freq: Every day | ORAL | 1 refills | Status: DC

## 2023-01-25 MED ORDER — THIAMINE MONONITRATE 100 MG PO TABS: 100.0000 mg | ORAL_TABLET | Freq: Every day | ORAL | 0 refills | Status: AC

## 2023-01-25 MED ORDER — POTASSIUM CHLORIDE CRYS ER 20 MEQ PO TBCR
40.0000 meq | EXTENDED_RELEASE_TABLET | Freq: Two times a day (BID) | ORAL | Status: DC
  Administered 2023-01-25: 40 meq via ORAL
  Filled 2023-01-25: qty 2

## 2023-01-25 MED ORDER — POTASSIUM CHLORIDE CRYS ER 20 MEQ PO TBCR: 20.0000 meq | EXTENDED_RELEASE_TABLET | Freq: Two times a day (BID) | ORAL | Status: DC

## 2023-01-25 MED ORDER — ADULT MULTIVITAMIN W/MINERALS CH: 1.0000 | ORAL_TABLET | Freq: Every day | ORAL | 0 refills | Status: AC

## 2023-01-25 MED ORDER — POTASSIUM CHLORIDE CRYS ER 20 MEQ PO TBCR: 20.0000 meq | EXTENDED_RELEASE_TABLET | Freq: Every day | ORAL | 0 refills | Status: AC

## 2023-01-25 NOTE — Progress Notes (Signed)
[facility]  ROUNDING NOTE   Subjective:   Furosemide gtt 5mg /hr UOP 2400  On salt tabs  Objective:  Vital signs in last 24 hours:  Temp:  [98 F (36.7 C)-98.7 F (37.1 C)] 98.7 F (37.1 C) (06/02 1211) Pulse Rate:  [74-81] 74 (06/02 1211) Resp:  [15-20] 20 (06/02 1211) BP: (93-115)/(57-63) 104/63 (06/02 1211) SpO2:  [90 %-95 %] 95 % (06/02 1211) Weight:  [75.2 kg] 75.2 kg (06/02 0431)  Weight change: -2.639 kg Filed Weights   01/23/23 0628 01/24/23 0530 01/25/23 0431  Weight: 79.1 kg 77.8 kg 75.2 kg    Intake/Output: I/O last 3 completed shifts: In: 530.3 [P.O.:460; I.V.:20.3; IV Piggyback:50] Out: 4000 [Urine:4000]   Intake/Output this shift:  No intake/output data recorded.  Physical Exam: General: NAD, sitting up in bed  Head: Normocephalic, atraumatic. Moist oral mucosal membranes  Eyes: Anicteric  Lungs:  Clear to auscultation, room air  Heart: Regular rate and rhythm  Abdomen:  Soft, nontender,   Extremities:  no peripheral edema.  Neurologic: Nonfocal, moving all four extremities  Skin: No lesions        Basic Metabolic Panel: Recent Labs  Lab 01/21/23 1252 01/21/23 1550 01/22/23 0422 01/22/23 1500 01/22/23 1950 01/23/23 0408 01/24/23 0425 01/25/23 0404  NA 118*   < > 119* 120* 121* 124* 126* 129*  K 4.5   < > 3.9 4.1 4.1 3.4* 3.4* 3.5  CL 90*   < > 88* 90* 91* 92* 90* 93*  CO2 21*   < > 23 24 23 25 28 28   GLUCOSE 133*   < > 111* 121* 122* 103* 107* 98  BUN 12   < > 12 12 11 10 9 10   CREATININE 0.81   < > 0.75 0.71 0.80 0.73 0.70 0.86  CALCIUM 8.4*   < > 8.5* 8.5* 8.3* 8.4* 8.3* 8.5*  MG 1.6*  --  2.0  --   --  1.8 1.7 1.9  PHOS 2.5  --   --   --   --   --   --   --    < > = values in this interval not displayed.     Liver Function Tests: Recent Labs  Lab 01/21/23 1550  AST 31  ALT 16  ALKPHOS 131*  BILITOT 3.1*  PROT 7.0  ALBUMIN 3.0*    No results for input(s): "LIPASE", "AMYLASE" in the last 168  hours. Recent Labs  Lab 01/21/23 1550  AMMONIA 84*     CBC: Recent Labs  Lab 01/21/23 1252 01/22/23 0422 01/23/23 0408 01/24/23 0425 01/25/23 0404  WBC 4.0 4.8 4.5 5.1 4.8  HGB 8.0* 8.7* 8.2* 8.1* 8.0*  HCT 23.9* 25.4* 23.5* 23.5* 23.7*  MCV 104.8* 102.0* 101.3* 102.2* 103.5*  PLT 77* 92* 85* 88* 89*     Cardiac Enzymes: No results for input(s): "CKTOTAL", "CKMB", "CKMBINDEX", "TROPONINI" in the last 168 hours.  BNP: Invalid input(s): "POCBNP"  CBG: No results for input(s): "GLUCAP" in the last 168 hours.  Microbiology: Results for orders placed or performed during the hospital encounter of 08/29/22  Resp panel by RT-PCR (RSV, Flu A&B, Covid) Anterior Nasal Swab     Status: None   Collection Time: 08/29/22  6:35 AM   Specimen: Anterior Nasal Swab  Result Value Ref Range Status   SARS Coronavirus 2 by RT PCR NEGATIVE NEGATIVE Final    Comment: (NOTE) SARS-CoV-2 target nucleic acids are NOT DETECTED.  The SARS-CoV-2 RNA is generally detectable  in upper respiratory specimens during the acute phase of infection. The lowest concentration of SARS-CoV-2 viral copies this assay can detect is 138 copies/mL. A negative result does not preclude SARS-Cov-2 infection and should not be used as the sole basis for treatment or other patient management decisions. A negative result may occur with  improper specimen collection/handling, submission of specimen other than nasopharyngeal swab, presence of viral mutation(s) within the areas targeted by this assay, and inadequate number of viral copies(<138 copies/mL). A negative result must be combined with clinical observations, patient history, and epidemiological information. The expected result is Negative.  Fact Sheet for Patients:  BloggerCourse.com  Fact Sheet for Healthcare Providers:  SeriousBroker.it  This test is no t yet approved or cleared by the [place] FDA  and  has been authorized for detection and/or diagnosis of SARS-CoV-2 by FDA under an Emergency Use Authorization (EUA). This EUA will remain  in effect (meaning this test can be used) for the duration of the COVID-19 declaration under Section 564(b)(1) of the Act, 21 U.S.C.section 360bbb-3(b)(1), unless the authorization is terminated  or revoked sooner.       Influenza A by PCR NEGATIVE NEGATIVE Final   Influenza B by PCR NEGATIVE NEGATIVE Final    Comment: (NOTE) The Xpert Xpress SARS-CoV-2/FLU/RSV plus assay is intended as an aid in the diagnosis of influenza from Nasopharyngeal swab specimens and should not be used as a sole basis for treatment. Nasal washings and aspirates are unacceptable for Xpert Xpress SARS-CoV-2/FLU/RSV testing.  Fact Sheet for Patients: BloggerCourse.com  Fact Sheet for Healthcare Providers: SeriousBroker.it  This test is not yet approved or cleared by the [place] FDA and has been authorized for detection and/or diagnosis of SARS-CoV-2 by FDA under an Emergency Use Authorization (EUA). This EUA will remain in effect (meaning this test can be used) for the duration of the COVID-19 declaration under Section 564(b)(1) of the Act, 21 U.S.C. section 360bbb-3(b)(1), unless the authorization is terminated or revoked.     Resp Syncytial Virus by PCR NEGATIVE NEGATIVE Final    Comment: (NOTE) Fact Sheet for Patients: BloggerCourse.com  Fact Sheet for Healthcare Providers: SeriousBroker.it  This test is not yet approved or cleared by the [place] FDA and has been authorized for detection and/or diagnosis of SARS-CoV-2 by FDA under an Emergency Use Authorization (EUA). This EUA will remain in effect (meaning this test can be used) for the duration of the COVID-19 declaration under Section 564(b)(1) of the Act, 21 U.S.C. section 360bbb-3(b)(1),  unless the authorization is terminated or revoked.  Performed at [facility], [address] [place], [place] [address]   Blood culture (routine single)     Status: None   Collection Time: 08/29/22  7:37 AM   Specimen: BLOOD  Result Value Ref Range Status   Specimen Description BLOOD  LEFT [facility]  Final   Special Requests   Final    BOTTLES DRAWN AEROBIC AND ANAEROBIC Blood Culture adequate volume   Culture   Final    NO GROWTH 5 DAYS Performed at [facility], [address], [place], [place] [address]    Report Status 09/03/2022 FINAL  Final  Urine Culture     Status: None   Collection Time: 08/29/22  9:06 AM   Specimen: In/Out Cath Urine  Result Value Ref Range Status   Specimen Description   Final    IN/OUT CATH URINE Performed at [facility], [address], [place], [place] [address]  Special Requests   Final    NONE Performed at [facility], [address], [place], [place] [address]    Culture   Final    NO GROWTH Performed at [facility], [address] [place], [place] [address]    Report Status 08/30/2022 FINAL  Final    Coagulation Studies: No results for input(s): "LABPROT", "INR" in the last 72 hours.   Urinalysis: No results for input(s): "COLORURINE", "LABSPEC", "PHURINE", "GLUCOSEU", "HGBUR", "BILIRUBINUR", "KETONESUR", "PROTEINUR", "UROBILINOGEN", "NITRITE", "LEUKOCYTESUR" in the last 72 hours.  Invalid input(s): "APPERANCEUR"    Imaging: No results found.   Medications:      allopurinol  200 mg Oral Daily   apixaban  5 mg Oral BID   arformoterol  15 mcg Nebulization BID   And   umeclidinium bromide  1 puff Inhalation Daily   carvedilol  12.5 mg Oral BID WC   folic acid  1 mg Oral Daily   lactulose  20 g Oral BID   magnesium oxide  400 mg Oral BID   multivitamin with minerals  1 tablet Oral Daily   naltrexone  50 mg Oral Daily   pantoprazole  40 mg Oral BID   potassium chloride  20  mEq Oral BID   pyridOXINE  100 mg Oral Daily   thiamine  100 mg Oral Daily   torsemide  20 mg Oral Daily   cyanocobalamin  100 mcg Oral Daily   acetaminophen, diphenhydrAMINE, guaiFENesin, hydrALAZINE, ipratropium-albuterol, metoprolol tartrate, ondansetron (ZOFRAN) IV, oxyCODONE, polyvinyl alcohol, senna-docusate, sodium chloride, traZODone  Assessment/ Plan:  Mr. Kenneth Stevenson is a 77 y.o.  male with  past medical conditions including GERD, hypertension, alcoholic liver cirrhosis, hyperlipidemia, CHF, CAD, and atrial fibrillation on Eliquis, who was admitted to [facility] on 01/21/2023 for Hyponatremia [E87.1] CHF exacerbation (HCC) [I50.9] Acute on chronic congestive heart failure, unspecified heart failure type (HCC) [I50.9]   Hyponatremia: hypervolemic - discontinued furosemide gtt - torsemide 20mg  daily on discharge.  - patient to stop drinking alcohol.  - fluid restriction - discontinue salt tabs  Hypokalemia: secondary to loop diuretic.  - PO potassium chloride  Hypertension: on carvedilol. Holding home lisinopril.  - may restart lisinopril  Anemia : macrocytic. Secondary to alcohol abuse. On folic acid. Needs full anemia work up.   Patient to follow up with Nephrology at the [facility].       LOS: 4 Kenneth Stevenson 6/2/20242:35 PM

## 2023-01-25 NOTE — Discharge Summary (Signed)
Physician Discharge Summary   Patient: Kenneth Stevenson MRN: [id number] DOB: 17-Feb-1946  Admit date:     01/21/2023  Discharge date: 01/25/23  Discharge Physician: [name]   [facility]: [facility]   Recommendations at discharge:   [facility] 1 to 2 week patient to also follow-up with the [facility] nephrologist. Will defer to PCP doing referral for nephrology  Discharge Diagnoses: Principal Problem:   CHF exacerbation [name]) Active Problems:   Hyponatremia   Hyperlipidemia   Alcohol abuse   Alcoholic cirrhosis (HCC)   CAD (coronary artery disease)   Atrial fibrillation, chronic (HCC)   Essential hypertension   COPD (chronic obstructive pulmonary disease) (HCC)   Gout   Barrett's esophagus   Thrombocytopenia (HCC)   Hypomagnesemia   Macrocytic anemia  77 with history of alcohol abuse, cirrhosis, HTN, HLD, CAD status post CABG, unspecified CHF, A-fib on Eliquis, gout, GERD, thrombocytopenia admitted for shortness of breath.  Upon admission found to have some evidence of fluid overload and significant hyponatremia.  Admitting provider consulted nephrology.  Per nephrology patient is currently on Lasix drip, sodium slowly improving.   Acute congestive heart failure with preserved ejection fraction, 60% - Echocardiogram here shows dilated cardiomyopathy with preserved EF of 60%.  Patient is clearly volume overloaded.   on IV Lasix drip.--change to po torsemide 20 mg qd  --UOP ~5900 ml   Hyponatremia:  - Nephrology team is following.  Admission sodium was 118 slowly improving, this morning 129.  Continue Lasix drip.  Continue to monitor urine output.  Nephrology recommending against tolvaptan given underlying liver disease -- patient now on 20 mg PO torsemide. He'll follow-up with nephrology as outpatient. Patient is advised to abstain from drinking alcohol   Hyperlipidemia:  -Noncompliant with Lipitor   Alcohol abuse -CIWA protocol-- no signs of withdrawal,  folic acid, multivitamin and thiamine -Continue home naltrexone 50 mg daily -- abstain from drinking alcohol   Alcoholic cirrhosis (HCC):  -Elevated ammonia but normal mentation.  Continue lactulose twice daily   CAD (coronary artery disease): No chest pain, patient's not -Observe closely   Atrial fibrillation, chronic (HCC): -Continue Coreg and Eliquis   Essential hypertension Coreg.   COPD (chronic obstructive pulmonary disease) (HCC): No wheezing -bronchodilators   Gout -Allopurinol   Barrett's esophagus -Protonix   Thrombocytopenia (HCC):  -Appears to be chronic.  No evidence of bleeding. -Counts remains stable   Hypomagnesemia: -As needed repletion   Macrocytic anemia: -Hb stable.    MT to ambulate pt today. Discussed discharge plan with patient and patient's daughter Kenneth Stevenson on the phone. Both are in agreement.  Outpatient palliative care referral send. Patient has multiple comorbidities.   DVT prophylaxis: Eliquis Code Status: Full Family Communication:   Status is: Inpatient Ongoing management for fluid overload on Lasix drip and hyponatremia If remains stable likley d/c 1-2 day      Diet recommendation:  Discharge Diet Orders (From admission, onward)     Start     Ordered   01/25/23 0000  Diet - low sodium heart healthy        01/25/23 1107            DISCHARGE MEDICATION: Allergies as of 01/25/2023       Reactions   Simvastatin    Empagliflozin Other (See Comments)   Other reaction(s): Tremor, Disorientated, Increased frequency of urination        Medication List     STOP taking these medications  colchicine 0.6 MG tablet   furosemide 20 MG tablet Commonly known as: Lasix       TAKE these medications    acetaminophen 325 MG tablet Commonly known as: TYLENOL Take 975 mg by mouth every 6 (six) hours as needed for mild pain.   albuterol 108 (90 Base) MCG/ACT inhaler Commonly known as: VENTOLIN HFA Inhale 1 puff into  the lungs every 6 (six) hours as needed for wheezing or shortness of breath.   allopurinol 100 MG tablet Commonly known as: ZYLOPRIM Take 200 mg by mouth daily.   carvedilol 12.5 MG tablet Commonly known as: COREG Take 12.5 mg by mouth 2 (two) times daily with a meal.   cyanocobalamin 100 MCG tablet Take 1 tablet by mouth daily. What changed: Another medication with the same name was removed. Continue taking this medication, and follow the directions you see here.   Eliquis 5 MG Tabs tablet Generic drug: apixaban Take 5 mg by mouth daily. Per Patient   eucerin lotion Apply 1 Application topically as needed for dry skin.   folic acid 1 MG tablet Commonly known as: FOLVITE Take 1 tablet (1 mg total) by mouth daily. Start taking on: January 26, 2023   guaiFENesin 600 MG 12 hr tablet Commonly known as: MUCINEX Take 600 mg by mouth 2 (two) times daily.   lactulose 10 GM/15ML solution Commonly known as: CHRONULAC Take 30 mLs (20 g total) by mouth 2 (two) times daily.   magnesium oxide 400 MG tablet Commonly known as: MAG-OX Take 400 mg by mouth 2 (two) times daily.   multivitamin with minerals Tabs tablet Take 1 tablet by mouth daily. Start taking on: January 26, 2023   naltrexone 50 MG tablet Commonly known as: DEPADE Take 50 mg by mouth daily.   omeprazole 20 MG capsule Commonly known as: PRILOSEC Take 20 mg by mouth daily. Per Pt   potassium chloride SA 20 MEQ tablet Commonly known as: KLOR-CON M Take 1 tablet (20 mEq total) by mouth daily.   pyridOXINE 100 MG tablet Commonly known as: VITAMIN B6 Take 100 mg by mouth daily.   sodium chloride 0.65 % Soln nasal spray Commonly known as: OCEAN Place 1 spray into both nostrils as needed for congestion.   Stiolto Respimat 2.5-2.5 MCG/ACT Aers Generic drug: Tiotropium Bromide-Olodaterol Inhale 2 each into the lungs daily.   thiamine 100 MG tablet Commonly known as: VITAMIN B1 Take 1 tablet (100 mg total) by mouth  daily. Start taking on: January 26, 2023   torsemide 20 MG tablet Commonly known as: DEMADEX Take 1 tablet (20 mg total) by mouth daily. Start taking on: January 26, 2023        Follow-up Information     [facility]. Schedule an appointment as soon as possible for a visit in 1 week(s).   Specialty: General Practice Why: hospital f/u Contact information: [address] [place] [address] [phone]                Discharge Exam: Ceasar Mons Weights   01/23/23 0628 01/24/23 0530 01/25/23 0431  Weight: 79.1 kg 77.8 kg 75.2 kg  GENERAL:  77 y.o.-year-old patient with no acute distress.  LUNGS: Normal breath sounds bilaterally, no wheezing CARDIOVASCULAR: S1, S2 normal. No murmur   ABDOMEN: Soft, nontender, nondistended. EXTREMITIES: Trace+ edema b/l.    NEUROLOGIC: nonfocal  patient is alert and awake   Condition at discharge: fair  The results of significant diagnostics from this hospitalization (including imaging,  microbiology, ancillary and laboratory) are listed below for reference.   Imaging Studies: ECHOCARDIOGRAM COMPLETE  Result Date: 01/22/2023    ECHOCARDIOGRAM REPORT   Patient Name:   WILKENS PANCOAST Date of Exam: 01/22/2023 Medical Rec #:  [id number]     Height:       70.7 in Accession #:    [id number]    Weight:       167.0 lb Date of Birth:  Nov 06, 1945     BSA:          1.948 m Patient Age:    11 years      BP:           116/57 mmHg Patient Gender: M             HR:           62 bpm. Exam Location:  [facility] Procedure: 2D Echo, Color Doppler and Cardiac Doppler Indications:     CHF-acute diastolic I50.31  History:         Patient has no prior history of Echocardiogram examinations.                  CHF, Prior CABG; Risk Factors:Hypertension.  Sonographer:     Cristela Blue Referring Phys:  [id number] Brien Few NIU Diagnosing Phys: Yvonne Kendall MD IMPRESSIONS  1. Left ventricular ejection fraction, by estimation, is 55 to 60%. The left ventricle has normal function. The left  ventricle has no regional wall motion abnormalities. There is mild left ventricular hypertrophy. Left ventricular diastolic parameters were normal.  2. Right ventricular systolic function is moderately reduced. The right ventricular size is normal.  3. Left atrial size was mild to moderately dilated.  4. Right atrial size was mild to moderately dilated.  5. The mitral valve is degenerative. Mild to moderate mitral valve regurgitation. No evidence of mitral stenosis.  6. Tricuspid valve regurgitation is mild to moderate.  7. The aortic valve is tricuspid. There is mild calcification of the aortic valve. There is moderate thickening of the aortic valve. Aortic valve regurgitation is not visualized. Aortic valve sclerosis/calcification is present, without any evidence of aortic stenosis. FINDINGS  Left Ventricle: Left ventricular ejection fraction, by estimation, is 55 to 60%. The left ventricle has normal function. The left ventricle has no regional wall motion abnormalities. The left ventricular internal cavity size was normal in size. There is  mild left ventricular hypertrophy. Left ventricular diastolic parameters were normal. Right Ventricle: Pulmonary artery pressure is upper normal to mildly elevated (RVSP 30 mmHg plus central venous pressure). The right ventricular size is normal. No increase in right ventricular wall thickness. Right ventricular systolic function is moderately reduced. Left Atrium: Left atrial size was mild to moderately dilated. Right Atrium: Right atrial size was mild to moderately dilated. Pericardium: The pericardium was not well visualized. Mitral Valve: The mitral valve is degenerative in appearance. There is mild thickening of the mitral valve leaflet(s). Mild to moderate mitral valve regurgitation. No evidence of mitral valve stenosis. Tricuspid Valve: The tricuspid valve is normal in structure. Tricuspid valve regurgitation is mild to moderate. Aortic Valve: The aortic valve is  tricuspid. There is mild calcification of the aortic valve. There is moderate thickening of the aortic valve. Aortic valve regurgitation is not visualized. Aortic valve sclerosis/calcification is present, without any evidence of aortic stenosis. Aortic valve mean gradient measures 5.0 mmHg. Aortic valve peak gradient measures 9.1 mmHg. Aortic valve area, by VTI measures 1.59 cm. Pulmonic Valve: The pulmonic valve was  not well visualized. Pulmonic valve regurgitation is mild. Aorta: The aortic root is normal in size and structure. Pulmonary Artery: The pulmonary artery is not well seen. Venous: The inferior vena cava was not well visualized. IAS/Shunts: The interatrial septum was not well visualized.  LEFT VENTRICLE PLAX 2D LVIDd:         3.50 cm   Diastology LVIDs:         2.50 cm   LV e' medial:    9.36 cm/s LV PW:         1.12 cm   LV E/e' medial:  10.9 LV IVS:        1.20 cm   LV e' lateral:   13.20 cm/s LVOT diam:     2.00 cm   LV E/e' lateral: 7.7 LV SV:         49 LV SV Index:   25 LVOT Area:     3.14 cm  RIGHT VENTRICLE RV Basal diam:  5.12 cm RV Mid diam:    3.50 cm LEFT ATRIUM           Index        RIGHT ATRIUM           Index LA diam:      5.70 cm 2.93 cm/m   RA Area:     25.30 cm LA Vol (A2C): 85.8 ml 44.04 ml/m  RA Volume:   81.10 ml  41.63 ml/m LA Vol (A4C): 58.3 ml 29.92 ml/m  AORTIC VALVE AV Area (Vmax):    1.51 cm AV Area (Vmean):   1.49 cm AV Area (VTI):     1.59 cm AV Vmax:           150.67 cm/s AV Vmean:          101.967 cm/s AV VTI:            0.306 m AV Peak Grad:      9.1 mmHg AV Mean Grad:      5.0 mmHg LVOT Vmax:         72.30 cm/s LVOT Vmean:        48.200 cm/s LVOT VTI:          0.155 m LVOT/AV VTI ratio: 0.51  AORTA Ao Root diam: 3.00 cm MITRAL VALVE                TRICUSPID VALVE MV Area (PHT): 4.21 cm     TR Peak grad:   29.4 mmHg MV Decel Time: 180 msec     TR Vmax:        271.00 cm/s MV E velocity: 102.00 cm/s MV A velocity: 47.70 cm/s   SHUNTS MV E/A ratio:  2.14          Systemic VTI:  0.16 m                             Systemic Diam: 2.00 cm Yvonne Kendall MD Electronically signed by Yvonne Kendall MD Signature Date/Time: 01/22/2023/5:01:20 PM    Final    DG Chest 2 View  Result Date: 01/21/2023 CLINICAL DATA:  Chest pain, shortness of breath EXAM: CHEST - 2 VIEW COMPARISON:  12/02/2022 FINDINGS: Transverse diameter of heart is increased. Central pulmonary vessels are prominent. Increased interstitial markings are seen in the parahilar regions and lower lung fields. There is blunting of both lateral CP angles. There is no pneumothorax. Metallic sutures are seen in the sternum,  possibly from previous coronary bypass surgery. IMPRESSION: Cardiomegaly. Increased interstitial markings are seen in both parahilar regions and lower lung fields suggesting interstitial pulmonary edema. Part of this finding may suggest underlying scarring. Small bilateral pleural effusions, more so on the right side. Electronically Signed   By: Ernie Avena M.D.   On: 01/21/2023 13:36    Microbiology: Results for orders placed or performed during the hospital encounter of 08/29/22  Resp panel by RT-PCR (RSV, Flu A&B, Covid) Anterior Nasal Swab     Status: None   Collection Time: 08/29/22  6:35 AM   Specimen: Anterior Nasal Swab  Result Value Ref Range Status   SARS Coronavirus 2 by RT PCR NEGATIVE NEGATIVE Final    Comment: (NOTE) SARS-CoV-2 target nucleic acids are NOT DETECTED.  The SARS-CoV-2 RNA is generally detectable in upper respiratory specimens during the acute phase of infection. The lowest concentration of SARS-CoV-2 viral copies this assay can detect is 138 copies/mL. A negative result does not preclude SARS-Cov-2 infection and should not be used as the sole basis for treatment or other patient management decisions. A negative result may occur with  improper specimen collection/handling, submission of specimen other than nasopharyngeal swab, presence of viral  mutation(s) within the areas targeted by this assay, and inadequate number of viral copies(<138 copies/mL). A negative result must be combined with clinical observations, patient history, and epidemiological information. The expected result is Negative.  Fact Sheet for Patients:  BloggerCourse.com  Fact Sheet for Healthcare Providers:  SeriousBroker.it  This test is no t yet approved or cleared by the [place] FDA and  has been authorized for detection and/or diagnosis of SARS-CoV-2 by FDA under an Emergency Use Authorization (EUA). This EUA will remain  in effect (meaning this test can be used) for the duration of the COVID-19 declaration under Section 564(b)(1) of the Act, 21 U.S.C.section 360bbb-3(b)(1), unless the authorization is terminated  or revoked sooner.       Influenza A by PCR NEGATIVE NEGATIVE Final   Influenza B by PCR NEGATIVE NEGATIVE Final    Comment: (NOTE) The Xpert Xpress SARS-CoV-2/FLU/RSV plus assay is intended as an aid in the diagnosis of influenza from Nasopharyngeal swab specimens and should not be used as a sole basis for treatment. Nasal washings and aspirates are unacceptable for Xpert Xpress SARS-CoV-2/FLU/RSV testing.  Fact Sheet for Patients: BloggerCourse.com  Fact Sheet for Healthcare Providers: SeriousBroker.it  This test is not yet approved or cleared by the [place] FDA and has been authorized for detection and/or diagnosis of SARS-CoV-2 by FDA under an Emergency Use Authorization (EUA). This EUA will remain in effect (meaning this test can be used) for the duration of the COVID-19 declaration under Section 564(b)(1) of the Act, 21 U.S.C. section 360bbb-3(b)(1), unless the authorization is terminated or revoked.     Resp Syncytial Virus by PCR NEGATIVE NEGATIVE Final    Comment: (NOTE) Fact Sheet for  Patients: BloggerCourse.com  Fact Sheet for Healthcare Providers: SeriousBroker.it  This test is not yet approved or cleared by the [place] FDA and has been authorized for detection and/or diagnosis of SARS-CoV-2 by FDA under an Emergency Use Authorization (EUA). This EUA will remain in effect (meaning this test can be used) for the duration of the COVID-19 declaration under Section 564(b)(1) of the Act, 21 U.S.C. section 360bbb-3(b)(1), unless the authorization is terminated or revoked.  Performed at [facility], [address] [place], [place] [address]   Blood culture (routine single)  Status: None   Collection Time: 08/29/22  7:37 AM   Specimen: BLOOD  Result Value Ref Range Status   Specimen Description BLOOD  LEFT [facility]  Final   Special Requests   Final    BOTTLES DRAWN AEROBIC AND ANAEROBIC Blood Culture adequate volume   Culture   Final    NO GROWTH 5 DAYS Performed at [facility], [address], [place], [place] [address]    Report Status 09/03/2022 FINAL  Final  Urine Culture     Status: None   Collection Time: 08/29/22  9:06 AM   Specimen: In/Out Cath Urine  Result Value Ref Range Status   Specimen Description   Final    IN/OUT CATH URINE Performed at [facility], [address] [place], [place] [address]    Special Requests   Final    NONE Performed at [facility], [address] [place], [place] [address]    Culture   Final    NO GROWTH Performed at [facility], [address], [place], [place] [address]    Report Status 08/30/2022 FINAL  Final    Labs: CBC: Recent Labs  Lab 01/21/23 1252 01/22/23 0422 01/23/23 0408 01/24/23 0425 01/25/23 0404  WBC 4.0 4.8 4.5 5.1 4.8  HGB 8.0* 8.7* 8.2* 8.1* 8.0*  HCT 23.9* 25.4* 23.5* 23.5* 23.7*  MCV 104.8* 102.0* 101.3* 102.2* 103.5*  PLT 77* 92* 85* 88* 89*   Basic Metabolic Panel: Recent Labs   Lab 01/21/23 1252 01/21/23 1550 01/22/23 0422 01/22/23 1500 01/22/23 1950 01/23/23 0408 01/24/23 0425 01/25/23 0404  NA 118*   < > 119* 120* 121* 124* 126* 129*  K 4.5   < > 3.9 4.1 4.1 3.4* 3.4* 3.5  CL 90*   < > 88* 90* 91* 92* 90* 93*  CO2 21*   < > 23 24 23 25 28 28   GLUCOSE 133*   < > 111* 121* 122* 103* 107* 98  BUN 12   < > 12 12 11 10 9 10   CREATININE 0.81   < > 0.75 0.71 0.80 0.73 0.70 0.86  CALCIUM 8.4*   < > 8.5* 8.5* 8.3* 8.4* 8.3* 8.5*  MG 1.6*  --  2.0  --   --  1.8 1.7 1.9  PHOS 2.5  --   --   --   --   --   --   --    < > = values in this interval not displayed.   Liver Function Tests: Recent Labs  Lab 01/21/23 1550  AST 31  ALT 16  ALKPHOS 131*  BILITOT 3.1*  PROT 7.0  ALBUMIN 3.0*    Discharge time spent: greater than 30 minutes.  Signed: [name], MD [facility] 01/25/2023

## 2023-01-25 NOTE — Progress Notes (Signed)
Mobility Specialist - Progress Note    01/25/23 1153  Mobility  Activity Ambulated independently in hallway  Level of Assistance Independent  Assistive Device Front wheel walker  Distance Ambulated (ft) 140 ft  Range of Motion/Exercises Active  Activity Response Tolerated well  Mobility Referral Yes  $Mobility charge 1 Mobility  Mobility Specialist Start Time (ACUTE ONLY) 1140  Mobility Specialist Stop Time (ACUTE ONLY) 1153  Mobility Specialist Time Calculation (min) (ACUTE ONLY) 13 min   Pt resting EOB upon entry on RA. Pt STS and ambulates to hallway Indep with RW. Pt endorses weakness but tolerates well during the session. Pt returns to recliner and left with needs in reach.  Marland Kitchenaap

## 2023-01-25 NOTE — Consult Note (Signed)
PHARMACY CONSULT NOTE  Pharmacy Consult for Electrolyte Monitoring and Replacement   Recent Labs: Potassium (mmol/L)  Date Value  01/25/2023 3.5  05/03/2013 4.0   Magnesium (mg/dL)  Date Value  16/05/9603 1.9   Calcium (mg/dL)  Date Value  54/04/8118 8.5 (L)   Calcium, Total (mg/dL)  Date Value  14/78/2956 9.1   Albumin (g/dL)  Date Value  21/30/8657 3.0 (L)   Phosphorus (mg/dL)  Date Value  84/69/6295 2.5   Sodium (mmol/L)  Date Value  01/25/2023 129 (L)  05/03/2013 133 (L)   Assessment: 77 y.o. male with PMH EtOH misuse, alcoholic cirrhosis, CAD, CABG, CHF, Afib on Eliquis, gout who presents with SOB requiring 2 L O2. Was recently hospitalized 4/9-4/20 for heart failure exacerbation and pt endorses non-adherence to furosemide as of late.  Pertinent current meds: NaCl 2 g PO TIDM MagOx 400 mg BID Lasix gtt at 5 mg/hr (2.5 ml/hr)  Diet: 1.2L fluid restriction  Goal of Therapy:  Electrolytes within normal limits  Plan:  --Na 126>129 over 24 hrs, trending up with diuresis, NaCl tablets, and fluid restriction --K 3.5, Kcl 40 mEq PO bid x 2 doses as still on lasix drip --Mg 1.9,  continue MagOx 400 mg PO BID --Follow-up electrolytes with AM labs tomorrow  Dortha Neighbors A 01/25/2023 7:33 AM

## 2023-01-30 ENCOUNTER — Encounter: Payer: No Typology Code available for payment source | Admitting: Family

## 2023-03-03 ENCOUNTER — Emergency Department: Payer: No Typology Code available for payment source

## 2023-03-03 ENCOUNTER — Other Ambulatory Visit: Payer: Self-pay

## 2023-03-03 ENCOUNTER — Inpatient Hospital Stay
Admission: EM | Admit: 2023-03-03 | Discharge: 2023-03-07 | DRG: 291 | Disposition: A | Discharge status: Home / Self Care | Payer: No Typology Code available for payment source | Attending: Internal Medicine | Admitting: Internal Medicine

## 2023-03-03 ENCOUNTER — Encounter: Payer: Self-pay | Admitting: *Deleted

## 2023-03-03 DIAGNOSIS — I2510 Atherosclerotic heart disease of native coronary artery without angina pectoris: Secondary | ICD-10-CM | POA: Diagnosis present

## 2023-03-03 DIAGNOSIS — K766 Portal hypertension: Secondary | ICD-10-CM | POA: Diagnosis present

## 2023-03-03 DIAGNOSIS — E785 Hyperlipidemia, unspecified: Secondary | ICD-10-CM | POA: Diagnosis present

## 2023-03-03 DIAGNOSIS — Z951 Presence of aortocoronary bypass graft: Secondary | ICD-10-CM

## 2023-03-03 DIAGNOSIS — Z1152 Encounter for screening for COVID-19: Secondary | ICD-10-CM

## 2023-03-03 DIAGNOSIS — D61818 Other pancytopenia: Secondary | ICD-10-CM | POA: Diagnosis present

## 2023-03-03 DIAGNOSIS — I482 Chronic atrial fibrillation, unspecified: Secondary | ICD-10-CM | POA: Diagnosis present

## 2023-03-03 DIAGNOSIS — I5033 Acute on chronic diastolic (congestive) heart failure: Secondary | ICD-10-CM | POA: Diagnosis present

## 2023-03-03 DIAGNOSIS — N5089 Other specified disorders of the male genital organs: Secondary | ICD-10-CM | POA: Diagnosis present

## 2023-03-03 DIAGNOSIS — R601 Generalized edema: Secondary | ICD-10-CM | POA: Diagnosis present

## 2023-03-03 DIAGNOSIS — E871 Hypo-osmolality and hyponatremia: Secondary | ICD-10-CM | POA: Diagnosis present

## 2023-03-03 DIAGNOSIS — Z79899 Other long term (current) drug therapy: Secondary | ICD-10-CM

## 2023-03-03 DIAGNOSIS — M7989 Other specified soft tissue disorders: Secondary | ICD-10-CM | POA: Diagnosis present

## 2023-03-03 DIAGNOSIS — K219 Gastro-esophageal reflux disease without esophagitis: Secondary | ICD-10-CM | POA: Diagnosis present

## 2023-03-03 DIAGNOSIS — K7031 Alcoholic cirrhosis of liver with ascites: Secondary | ICD-10-CM | POA: Diagnosis present

## 2023-03-03 DIAGNOSIS — I10 Essential (primary) hypertension: Secondary | ICD-10-CM | POA: Diagnosis present

## 2023-03-03 DIAGNOSIS — J449 Chronic obstructive pulmonary disease, unspecified: Secondary | ICD-10-CM | POA: Diagnosis present

## 2023-03-03 DIAGNOSIS — Z7901 Long term (current) use of anticoagulants: Secondary | ICD-10-CM

## 2023-03-03 DIAGNOSIS — I110 Hypertensive heart disease with heart failure: Principal | ICD-10-CM | POA: Diagnosis present

## 2023-03-03 DIAGNOSIS — E872 Acidosis, unspecified: Secondary | ICD-10-CM | POA: Diagnosis present

## 2023-03-03 DIAGNOSIS — Z888 Allergy status to other drugs, medicaments and biological substances status: Secondary | ICD-10-CM

## 2023-03-03 DIAGNOSIS — I509 Heart failure, unspecified: Principal | ICD-10-CM

## 2023-03-03 DIAGNOSIS — M109 Gout, unspecified: Secondary | ICD-10-CM | POA: Diagnosis present

## 2023-03-03 DIAGNOSIS — K3189 Other diseases of stomach and duodenum: Secondary | ICD-10-CM | POA: Diagnosis present

## 2023-03-03 DIAGNOSIS — F1010 Alcohol abuse, uncomplicated: Secondary | ICD-10-CM | POA: Diagnosis present

## 2023-03-03 DIAGNOSIS — K2270 Barrett's esophagus without dysplasia: Secondary | ICD-10-CM | POA: Diagnosis present

## 2023-03-03 DIAGNOSIS — Z7983 Long term (current) use of bisphosphonates: Secondary | ICD-10-CM

## 2023-03-03 DIAGNOSIS — D539 Nutritional anemia, unspecified: Secondary | ICD-10-CM | POA: Diagnosis present

## 2023-03-03 HISTORY — DX: Unspecified atrial fibrillation: I48.91

## 2023-03-03 HISTORY — DX: Unspecified cirrhosis of liver: K74.60

## 2023-03-03 HISTORY — DX: Chronic obstructive pulmonary disease, unspecified: J44.9

## 2023-03-03 LAB — COMPREHENSIVE METABOLIC PANEL
ALT: 17 U/L (ref 0–44)
AST: 35 U/L (ref 15–41)
Albumin: 3.2 g/dL — ABNORMAL LOW (ref 3.5–5.0)
Alkaline Phosphatase: 148 U/L — ABNORMAL HIGH (ref 38–126)
Anion gap: 8 (ref 5–15)
BUN: 13 mg/dL (ref 8–23)
CO2: 22 mmol/L (ref 22–32)
Calcium: 8.8 mg/dL — ABNORMAL LOW (ref 8.9–10.3)
Chloride: 92 mmol/L — ABNORMAL LOW (ref 98–111)
Creatinine, Ser: 0.87 mg/dL (ref 0.61–1.24)
GFR, Estimated: >60 mL/min (ref >60)
Glucose, Bld: 107 mg/dL — ABNORMAL HIGH (ref 70–99)
Potassium: 3.9 mmol/L (ref 3.5–5.1)
Sodium: 122 mmol/L — ABNORMAL LOW (ref 135–145)
Total Bilirubin: 4.9 mg/dL — ABNORMAL HIGH (ref 0.3–1.2)
Total Protein: 8 g/dL (ref 6.5–8.1)

## 2023-03-03 LAB — CBC
HCT: 23.1 % — ABNORMAL LOW (ref 39.0–52.0)
Hemoglobin: 8 g/dL — ABNORMAL LOW (ref 13.0–17.0)
MCH: 37.2 pg — ABNORMAL HIGH (ref 26.0–34.0)
MCHC: 34.6 g/dL (ref 30.0–36.0)
MCV: 107.4 fL — ABNORMAL HIGH (ref 80.0–100.0)
Platelets: 85 10*3/uL — ABNORMAL LOW (ref 150–400)
RBC: 2.15 MIL/uL — ABNORMAL LOW (ref 4.22–5.81)
RDW: 21.2 % — ABNORMAL HIGH (ref 11.5–15.5)
WBC: 4.3 10*3/uL (ref 4.0–10.5)
nRBC: 0 % (ref 0.0–0.2)

## 2023-03-03 LAB — BRAIN NATRIURETIC PEPTIDE: B Natriuretic Peptide: 608.8 pg/mL — ABNORMAL HIGH (ref 0.0–100.0)

## 2023-03-03 LAB — TROPONIN I (HIGH SENSITIVITY): Troponin I (High Sensitivity): 14 ng/L (ref <18)

## 2023-03-03 MED ORDER — FUROSEMIDE 10 MG/ML IJ SOLN
80.0000 mg | Freq: Once | INTRAMUSCULAR | Status: AC
  Administered 2023-03-03: 80 mg via INTRAVENOUS
  Filled 2023-03-03: qty 8

## 2023-03-03 NOTE — ED Triage Notes (Addendum)
Pt arrives due to discomfort and swelling of testicles. Pt has swelling of scrotum and penis.  Pt has swelling in his legs as well.  Hx of CHF.  Pt has chronic sob, not worse at this time. Pt reports swelling in testicles began yesterday morning.

## 2023-03-03 NOTE — ED Provider Notes (Signed)
[facility] Provider Note    Event Date/Time   First MD Initiated Contact with Patient 03/03/23 2315     (approximate)   History   Groin Swelling   HPI  Kenneth Stevenson is a 77 y.o. male with a history of alcohol abuse, cirrhosis, hypertension, hyperlipidemia, CAD status post CABG, CHF, atrial fibrillation on Eliquis, gout, GERD, and thrombocytopenia who presents with scrotal edema for the last 2 days, gradual onset, worsening course, associated with bilateral lower extremity edema which has also worsened over similar period.  The patient states he is compliant with his torsemide.  He reports some increased shortness of breath with exertion.  He denies any abdominal pain or swelling.  He has no fever or chills.  He denies any urinary symptoms.  I the past medical records.  The patient's most recent inpatient admission to the hospitalist service was last month.  The patient was treated for acute on chronic CHF and hyponatremia.   Physical Exam   Triage Vital Signs: ED Triage Vitals  Enc Vitals Group     BP 03/03/23 2102 117/68     Pulse Rate 03/03/23 2102 90     Resp 03/03/23 2102 16     Temp 03/03/23 2102 98 F (36.7 C)     Temp Source 03/03/23 2102 Oral     SpO2 03/03/23 2102 94 %     Weight --      Height --      Head Circumference --      Peak Flow --      Pain Score 03/03/23 2106 3     Pain Loc --      Pain Edu? --      Excl. in GC? --     Most recent vital signs: Vitals:   03/04/23 0045 03/04/23 0145  BP:  130/83  Pulse: 93 93  Resp: 20 18  Temp:    SpO2:       General: Awake, no distress.  CV:  Good peripheral perfusion.  Resp:  Normal effort.  Diminished breath sounds bilaterally with some scattered wheezes. Abd:  No distention.  Other:  2+ bilateral lower extremity edema.  Moderate scrotal edema with no erythema, induration, abnormal warmth, or tenderness.   ED Results / Procedures / Treatments   Labs (all labs ordered are  listed, but only abnormal results are displayed) Labs Reviewed  CBC - Abnormal; Notable for the following components:      Result Value   RBC 2.15 (*)    Hemoglobin 8.0 (*)    HCT 23.1 (*)    MCV 107.4 (*)    MCH 37.2 (*)    RDW 21.2 (*)    Platelets 85 (*)    All other components within normal limits  BRAIN NATRIURETIC PEPTIDE - Abnormal; Notable for the following components:   B Natriuretic Peptide 608.8 (*)    All other components within normal limits  COMPREHENSIVE METABOLIC PANEL - Abnormal; Notable for the following components:   Sodium 122 (*)    Chloride 92 (*)    Glucose, Bld 107 (*)    Calcium 8.8 (*)    Albumin 3.2 (*)    Alkaline Phosphatase 148 (*)    Total Bilirubin 4.9 (*)    All other components within normal limits  AMMONIA  TROPONIN I (HIGH SENSITIVITY)  TROPONIN I (HIGH SENSITIVITY)     EKG  ED ECG REPORT I, Dionne Bucy, the attending physician, personally viewed and interpreted  this ECG.  Date: 03/03/2023 EKG Time: 2112 Rate: 94 Rhythm: Atrial fibrillation QRS Axis: normal Intervals: normal ST/T Wave abnormalities: Nonspecific ST abnormalities Narrative Interpretation: no evidence of acute ischemia    RADIOLOGY  Chest x-ray: I independently viewed and interpreted the images; there is some interstitial edema with no focal consolidation or effusions  US scrotum:   IMPRESSION:  1. Bilateral scrotal edema.    PROCEDURES:  Critical Care performed: No  Procedures   MEDICATIONS ORDERED IN ED: Medications  furosemide (LASIX) injection 80 mg (80 mg Intravenous Given 03/03/23 2356)     IMPRESSION / MDM / ASSESSMENT AND PLAN / ED COURSE  I reviewed the triage vital signs and the nursing notes.  77 year old male with PMH as noted above presents with worsening scrotal edema, bilateral lower extremity edema, and shortness of breath over the last several days despite being compliant with his diuretic.  Differential diagnosis  includes, but is not limited to, CHF exacerbation, edema due to liver cirrhosis or chronic kidney disease, dependent edema.  There is no evidence of scrotal cellulitis or of any acute testicular abnormality.  Patient's presentation is most consistent with severe exacerbation of chronic illness.  The patient is on the cardiac monitor to evaluate for evidence of arrhythmia and/or significant heart rate changes.  Lab workup shows elevated BNP.  Troponin is negative.  Bilirubin is elevated which is chronic for the patient.  Chest x-ray shows pulmonary edema.  Scrotal ultrasound shows edema with no other acute findings.  I have ordered a dose of IV Lasix.  The patient will need admission for further treatment.  ----------------------------------------- 1:30 AM on 03/04/2023 -----------------------------------------  I consulted Dr. Para March from the hospitalist service; based on our discussion she agrees to evaluate the patient for admission.  FINAL CLINICAL IMPRESSION(S) / ED DIAGNOSES   Final diagnoses:  Acute on chronic congestive heart failure, unspecified heart failure type (HCC)     Rx / DC Orders   ED Discharge Orders     None        Note:  This document was prepared using Dragon voice recognition software and may include unintentional dictation errors.    Dionne Bucy, MD 03/04/23 [phone]

## 2023-03-03 NOTE — ED Notes (Signed)
Pt has been declining for the past 2-3 days with his memory being worse per daughter.  Daughter also states that pt is an alcoholic and has cirrhosis and still drinks.  Pt is alert and oriented in triage

## 2023-03-04 ENCOUNTER — Encounter: Payer: Self-pay | Admitting: Internal Medicine

## 2023-03-04 DIAGNOSIS — D61818 Other pancytopenia: Secondary | ICD-10-CM | POA: Diagnosis present

## 2023-03-04 DIAGNOSIS — D539 Nutritional anemia, unspecified: Secondary | ICD-10-CM | POA: Diagnosis present

## 2023-03-04 DIAGNOSIS — E871 Hypo-osmolality and hyponatremia: Secondary | ICD-10-CM | POA: Diagnosis present

## 2023-03-04 DIAGNOSIS — I2510 Atherosclerotic heart disease of native coronary artery without angina pectoris: Secondary | ICD-10-CM | POA: Diagnosis present

## 2023-03-04 DIAGNOSIS — N5089 Other specified disorders of the male genital organs: Secondary | ICD-10-CM | POA: Diagnosis present

## 2023-03-04 DIAGNOSIS — R601 Generalized edema: Secondary | ICD-10-CM | POA: Diagnosis not present

## 2023-03-04 DIAGNOSIS — E785 Hyperlipidemia, unspecified: Secondary | ICD-10-CM | POA: Diagnosis present

## 2023-03-04 DIAGNOSIS — K3189 Other diseases of stomach and duodenum: Secondary | ICD-10-CM | POA: Diagnosis present

## 2023-03-04 DIAGNOSIS — I509 Heart failure, unspecified: Secondary | ICD-10-CM | POA: Diagnosis present

## 2023-03-04 DIAGNOSIS — E872 Acidosis, unspecified: Secondary | ICD-10-CM | POA: Insufficient documentation

## 2023-03-04 DIAGNOSIS — M7989 Other specified soft tissue disorders: Secondary | ICD-10-CM | POA: Diagnosis present

## 2023-03-04 DIAGNOSIS — Z79899 Other long term (current) drug therapy: Secondary | ICD-10-CM | POA: Diagnosis not present

## 2023-03-04 DIAGNOSIS — M109 Gout, unspecified: Secondary | ICD-10-CM | POA: Diagnosis present

## 2023-03-04 DIAGNOSIS — Z7983 Long term (current) use of bisphosphonates: Secondary | ICD-10-CM | POA: Diagnosis not present

## 2023-03-04 DIAGNOSIS — I5033 Acute on chronic diastolic (congestive) heart failure: Secondary | ICD-10-CM | POA: Diagnosis present

## 2023-03-04 DIAGNOSIS — Z1152 Encounter for screening for COVID-19: Secondary | ICD-10-CM | POA: Diagnosis not present

## 2023-03-04 DIAGNOSIS — Z951 Presence of aortocoronary bypass graft: Secondary | ICD-10-CM | POA: Diagnosis not present

## 2023-03-04 DIAGNOSIS — K219 Gastro-esophageal reflux disease without esophagitis: Secondary | ICD-10-CM | POA: Diagnosis present

## 2023-03-04 DIAGNOSIS — Z7901 Long term (current) use of anticoagulants: Secondary | ICD-10-CM | POA: Diagnosis not present

## 2023-03-04 DIAGNOSIS — I110 Hypertensive heart disease with heart failure: Secondary | ICD-10-CM | POA: Diagnosis present

## 2023-03-04 DIAGNOSIS — I482 Chronic atrial fibrillation, unspecified: Secondary | ICD-10-CM | POA: Diagnosis present

## 2023-03-04 DIAGNOSIS — K766 Portal hypertension: Secondary | ICD-10-CM | POA: Diagnosis present

## 2023-03-04 DIAGNOSIS — K7031 Alcoholic cirrhosis of liver with ascites: Secondary | ICD-10-CM | POA: Diagnosis present

## 2023-03-04 DIAGNOSIS — J449 Chronic obstructive pulmonary disease, unspecified: Secondary | ICD-10-CM | POA: Diagnosis present

## 2023-03-04 DIAGNOSIS — F1010 Alcohol abuse, uncomplicated: Secondary | ICD-10-CM | POA: Diagnosis present

## 2023-03-04 DIAGNOSIS — K2270 Barrett's esophagus without dysplasia: Secondary | ICD-10-CM | POA: Diagnosis present

## 2023-03-04 LAB — CBC
HCT: 21.7 % — ABNORMAL LOW (ref 39.0–52.0)
Hemoglobin: 7.7 g/dL — ABNORMAL LOW (ref 13.0–17.0)
MCH: 37 pg — ABNORMAL HIGH (ref 26.0–34.0)
MCHC: 35.5 g/dL (ref 30.0–36.0)
MCV: 104.3 fL — ABNORMAL HIGH (ref 80.0–100.0)
Platelets: 72 10*3/uL — ABNORMAL LOW (ref 150–400)
RBC: 2.08 MIL/uL — ABNORMAL LOW (ref 4.22–5.81)
RDW: 21.3 % — ABNORMAL HIGH (ref 11.5–15.5)
WBC: 4.5 10*3/uL (ref 4.0–10.5)
nRBC: 0 % (ref 0.0–0.2)

## 2023-03-04 LAB — IRON AND TIBC
Iron: 93 ug/dL (ref 45–182)
Saturation Ratios: 28 % (ref 17.9–39.5)
TIBC: 337 ug/dL (ref 250–450)
UIBC: 244 ug/dL

## 2023-03-04 LAB — BASIC METABOLIC PANEL
Anion gap: 13 (ref 5–15)
BUN: 12 mg/dL (ref 8–23)
CO2: 21 mmol/L — ABNORMAL LOW (ref 22–32)
Calcium: 8.7 mg/dL — ABNORMAL LOW (ref 8.9–10.3)
Chloride: 95 mmol/L — ABNORMAL LOW (ref 98–111)
Creatinine, Ser: 0.82 mg/dL (ref 0.61–1.24)
GFR, Estimated: >60 mL/min (ref >60)
Glucose, Bld: 90 mg/dL (ref 70–99)
Potassium: 3.6 mmol/L (ref 3.5–5.1)
Sodium: 127 mmol/L — ABNORMAL LOW (ref 135–145)

## 2023-03-04 LAB — TROPONIN I (HIGH SENSITIVITY)
Troponin I (High Sensitivity): 13 ng/L (ref <18)
Troponin I (High Sensitivity): 18 ng/L — ABNORMAL HIGH (ref <18)
Troponin I (High Sensitivity): 19 ng/L — ABNORMAL HIGH (ref <18)

## 2023-03-04 LAB — HEMOGLOBIN
Hemoglobin: 7.9 g/dL — ABNORMAL LOW (ref 13.0–17.0)
Hemoglobin: 7.8 g/dL — ABNORMAL LOW (ref 13.0–17.0)

## 2023-03-04 LAB — VITAMIN B12: Vitamin B-12: 1253 pg/mL — ABNORMAL HIGH (ref 180–914)

## 2023-03-04 LAB — FERRITIN: Ferritin: 54 ng/mL (ref 24–336)

## 2023-03-04 LAB — AMMONIA: Ammonia: 27 umol/L (ref 9–35)

## 2023-03-04 MED ORDER — FUROSEMIDE 10 MG/ML IJ SOLN
40.0000 mg | Freq: Two times a day (BID) | INTRAMUSCULAR | Status: DC
  Administered 2023-03-04 – 2023-03-07 (×7): 40 mg via INTRAVENOUS
  Filled 2023-03-04 (×7): qty 4

## 2023-03-04 MED ORDER — ONDANSETRON HCL 4 MG PO TABS: 4.0000 mg | ORAL_TABLET | Freq: Four times a day (QID) | ORAL | Status: DC | PRN

## 2023-03-04 MED ORDER — VITAMIN B-12 100 MCG PO TABS
100.0000 ug | ORAL_TABLET | Freq: Every day | ORAL | Status: DC
  Administered 2023-03-04 – 2023-03-07 (×4): 100 ug via ORAL
  Filled 2023-03-04 (×5): qty 1

## 2023-03-04 MED ORDER — THIAMINE HCL 100 MG/ML IJ SOLN: 100.0000 mg | Freq: Every day | INTRAMUSCULAR | Status: DC

## 2023-03-04 MED ORDER — CARVEDILOL 12.5 MG PO TABS
12.5000 mg | ORAL_TABLET | Freq: Two times a day (BID) | ORAL | Status: DC
  Administered 2023-03-04 – 2023-03-07 (×6): 12.5 mg via ORAL
  Filled 2023-03-04 (×6): qty 1

## 2023-03-04 MED ORDER — ALBUTEROL SULFATE (2.5 MG/3ML) 0.083% IN NEBU: 2.5000 mg | INHALATION_SOLUTION | Freq: Four times a day (QID) | RESPIRATORY_TRACT | Status: DC | PRN

## 2023-03-04 MED ORDER — APIXABAN 5 MG PO TABS
5.0000 mg | ORAL_TABLET | Freq: Every day | ORAL | Status: DC
  Administered 2023-03-04: 5 mg via ORAL
  Filled 2023-03-04: qty 1

## 2023-03-04 MED ORDER — ACETAMINOPHEN 325 MG PO TABS: 650.0000 mg | ORAL_TABLET | Freq: Four times a day (QID) | ORAL | Status: DC | PRN

## 2023-03-04 MED ORDER — ALBUTEROL SULFATE HFA 108 (90 BASE) MCG/ACT IN AERS: 1.0000 | INHALATION_SPRAY | Freq: Four times a day (QID) | RESPIRATORY_TRACT | Status: DC | PRN

## 2023-03-04 MED ORDER — ACETAMINOPHEN 650 MG RE SUPP: 650.0000 mg | Freq: Four times a day (QID) | RECTAL | Status: DC | PRN

## 2023-03-04 MED ORDER — UMECLIDINIUM BROMIDE 62.5 MCG/ACT IN AEPB
1.0000 | INHALATION_SPRAY | Freq: Every day | RESPIRATORY_TRACT | Status: DC
  Administered 2023-03-04 – 2023-03-07 (×4): 1 via RESPIRATORY_TRACT
  Filled 2023-03-04 (×2): qty 7

## 2023-03-04 MED ORDER — THIAMINE MONONITRATE 100 MG PO TABS
100.0000 mg | ORAL_TABLET | Freq: Every day | ORAL | Status: DC
  Administered 2023-03-04 – 2023-03-07 (×4): 100 mg via ORAL
  Filled 2023-03-04 (×4): qty 1

## 2023-03-04 MED ORDER — LORAZEPAM 1 MG PO TABS
1.0000 mg | ORAL_TABLET | ORAL | Status: AC | PRN
  Administered 2023-03-04: 1 mg via ORAL
  Filled 2023-03-04: qty 1

## 2023-03-04 MED ORDER — FUROSEMIDE 10 MG/ML IJ SOLN: 40.0000 mg | Freq: Two times a day (BID) | INTRAMUSCULAR | Status: DC

## 2023-03-04 MED ORDER — PANTOPRAZOLE SODIUM 40 MG PO TBEC
40.0000 mg | DELAYED_RELEASE_TABLET | Freq: Every day | ORAL | Status: DC
  Administered 2023-03-04 – 2023-03-07 (×4): 40 mg via ORAL
  Filled 2023-03-04 (×4): qty 1

## 2023-03-04 MED ORDER — MAGNESIUM OXIDE -MG SUPPLEMENT 400 (240 MG) MG PO TABS
400.0000 mg | ORAL_TABLET | Freq: Two times a day (BID) | ORAL | Status: DC
  Administered 2023-03-04 – 2023-03-07 (×7): 400 mg via ORAL
  Filled 2023-03-04 (×8): qty 1

## 2023-03-04 MED ORDER — MORPHINE SULFATE (PF) 2 MG/ML IV SOLN: 2.0000 mg | INTRAVENOUS | Status: DC | PRN

## 2023-03-04 MED ORDER — LORAZEPAM 2 MG/ML IJ SOLN: 1.0000 mg | INTRAMUSCULAR | Status: AC | PRN

## 2023-03-04 MED ORDER — ADULT MULTIVITAMIN W/MINERALS CH
1.0000 | ORAL_TABLET | Freq: Every day | ORAL | Status: DC
  Administered 2023-03-04 – 2023-03-07 (×4): 1 via ORAL
  Filled 2023-03-04 (×4): qty 1

## 2023-03-04 MED ORDER — ORAL CARE MOUTH RINSE: 15.0000 mL | OROMUCOSAL | Status: DC | PRN

## 2023-03-04 MED ORDER — ONDANSETRON HCL 4 MG/2ML IJ SOLN: 4.0000 mg | Freq: Four times a day (QID) | INTRAMUSCULAR | Status: DC | PRN

## 2023-03-04 MED ORDER — FOLIC ACID 1 MG PO TABS
1.0000 mg | ORAL_TABLET | Freq: Every day | ORAL | Status: DC
  Administered 2023-03-04 – 2023-03-07 (×4): 1 mg via ORAL
  Filled 2023-03-04 (×4): qty 1

## 2023-03-04 MED ORDER — FOLIC ACID 1 MG PO TABS: 1.0000 mg | ORAL_TABLET | Freq: Every day | ORAL | Status: DC

## 2023-03-04 MED ORDER — OXYCODONE HCL 5 MG PO TABS: 5.0000 mg | ORAL_TABLET | ORAL | Status: DC | PRN

## 2023-03-04 MED ORDER — LACTULOSE 10 GM/15ML PO SOLN
20.0000 g | Freq: Two times a day (BID) | ORAL | Status: DC
  Administered 2023-03-04 – 2023-03-07 (×7): 20 g via ORAL
  Filled 2023-03-04 (×7): qty 30

## 2023-03-04 MED ORDER — ARFORMOTEROL TARTRATE 15 MCG/2ML IN NEBU
15.0000 ug | INHALATION_SOLUTION | Freq: Two times a day (BID) | RESPIRATORY_TRACT | Status: DC
  Administered 2023-03-04 – 2023-03-07 (×6): 15 ug via RESPIRATORY_TRACT
  Filled 2023-03-04 (×7): qty 2

## 2023-03-04 NOTE — Discharge Instructions (Signed)
                  Intensive Outpatient Programs  [facility]    [facility] [address]     [address] [place],  [place]     [place], [place] [phone]      [phone]  [facility]   [facility]  (Inpatient and outpatient)  [phone] (Suboxone and Methadone) [address]           [phone]           [facility]    [facility] - Intensive Outpatient [address]     [address] [place], [place] [address]     [place], [place]  [phone]      [phone]  [facility] (Outpatient, Inpatient, [facility]  [facility] (Groups and Residental) (insurance only) [phone]    [place], [place]          [phone]       [facility]    [facility] (for caregivers and family) [address]     [address] [place], [place]     [place], [place] [phone]      [phone]  Residential Treatment Programs  [facility]  [facility](2 years) Residential: 90 days)  [facility] [address]      [address] [place], [place]     [place], [place] [phone]      [phone] or [phone]  [facility]    [facility] [address]      [address] [place], [place]     , [place] [phone]      [phone]  [facility]   [facility] ([facility]) [address]     [address] [place], [place] [address]     [place], [place] [phone]      [phone] Admissions: 8am-3pm M-F  [facility]: Residential Program (90 Days)              [facility]:  [facility]  [place], [place]     [place],   [phone] or [phone]    (Walk in Hours over the weekend or by referral)   [facility]: [facility]:[phone] (for crisis  response 24 hours a day) 

## 2023-03-04 NOTE — TOC Initial Note (Addendum)
Transition of Care [facility]) - Initial/Assessment Note    Patient Details  Name: Kenneth Stevenson MRN: [id number] Date of Birth: [birth year]  Transition of [facility]) CM/SW Contact:    Margarito Liner, LCSW Phone Number: 03/04/2023, 11:18 AM  Clinical Narrative:   Readmission prevention screen complete. CSW met with patient. No supports at bedside. CSW introduced role and explained that discharge planning would be discussed. PCP is Rosanne Sack, MD at the [facility] but he reports having a team of doctors there. Patient drives to his appointment at times and daughter drives him sometimes. He uses the [facility] No issues obtaining his medications. Patient lives home with his wife and daughter. No home health prior to admission but he said the [place] pays for someone to come clean his home three times a week. Patient has a RW and cane at home. CSW sent a secure email to the [facility] hospital liaison to see if they had any transportation resources for him when he or his daughter are unable to transport to appointments. Also asked her to confirm that they pay for someone to come clean his home and no other home health services. Patient is agreeable to [facility] resources. Will add to his AVS. No further concerns. CSW encouraged patient to contact CSW as needed. CSW will continue to follow patient for support and facilitate return home once stable. His daughter will transport him home at discharge.               11:53 am: [facility] hospital liaison said patient is approved for 11 hours per week of aide services through [employer]. They are not there to clean his home but to assist him with ADL's. Patient will have to follow up at the [facility] for transportation resources.  Expected Discharge Plan: Home/Self Care Barriers to Discharge: Continued Medical Work up   Patient Goals and CMS Choice            Expected Discharge Plan and Services       Living arrangements for the past 2 months: Single Family Home                                       Prior Living Arrangements/Services Living arrangements for the past 2 months: Single Family Home Lives with:: Adult Children, Spouse Patient language and need for interpreter reviewed:: Yes Do you feel safe going back to the place where you live?: Yes      Need for Family Participation in Patient Care: Yes (Comment) Care giver support system in place?: Yes (comment) Current home services: DME Criminal Activity/Legal Involvement Pertinent to Current Situation/Hospitalization: No - Comment as needed  Activities of Daily Living Home Assistive Devices/Equipment: None ADL Screening (condition at time of admission) Patient's cognitive ability adequate to safely complete daily activities?: Yes Is the patient deaf or have difficulty hearing?: No Does the patient have difficulty seeing, even when wearing glasses/contacts?: No Does the patient have difficulty concentrating, remembering, or making decisions?: No Patient able to express need for assistance with ADLs?: Yes Does the patient have difficulty dressing or bathing?: No Independently performs ADLs?: Yes (appropriate for developmental age) Does the patient have difficulty walking or climbing stairs?: No Weakness of Legs: None Weakness of Arms/Hands: None  Permission Sought/Granted Permission sought to share information with : Facility Industrial/product designer granted to share information with : Yes, Verbal Permission Granted  Permission granted to share info w AGENCY: [facility]        Emotional Assessment Appearance:: Appears stated age Attitude/Demeanor/Rapport: Engaged, Gracious Affect (typically observed): Accepting, Appropriate, Calm, Pleasant Orientation: : Oriented to Self, Oriented to Place, Oriented to  Time, Oriented to Situation Alcohol / Substance Use: Alcohol Use Psych Involvement: No (comment)  Admission diagnosis:  Anasarca [R60.1] Acute on chronic congestive  heart failure, unspecified heart failure type [facility]) [I50.9] Patient Active Problem List   Diagnosis Date Noted   Anasarca 03/04/2023   Acute on chronic diastolic CHF (congestive heart failure) (HCC) 03/04/2023   Pancytopenia (HCC) 03/04/2023   Hyponatremia 01/21/2023   CHF exacerbation (HCC) 01/21/2023   Thrombocytopenia (HCC) 01/21/2023   Hypomagnesemia 01/21/2023   Macrocytic anemia 01/21/2023   Acute on chronic heart failure (HCC) 12/02/2022   Melena 12/02/2022   Epistaxis 12/02/2022   Alcoholic cirrhosis of liver with ascites (HCC) 12/02/2022   Seizure-like activity (HCC) 11/06/2022   Alcohol withdrawal seizure (HCC) 08/29/2022   Alcohol abuse 07/24/2021   Barrett's esophagus 07/24/2021   Other and unspecified angina pectoris 07/24/2021   Complete rotator cuff tear or rupture of left shoulder, not specified as traumatic 07/24/2021   Cough, unspecified 07/24/2021   Dermatitis, unspecified 07/24/2021   Difficulty in walking, not elsewhere classified 07/24/2021   Dyspnea, unspecified 07/24/2021   Fracture of unspecified part of neck of unspecified femur, initial encounter for closed fracture (HCC) 07/24/2021   Gastroesophageal reflux disease without esophagitis 07/24/2021   Gout, unspecified 07/24/2021   Gout 07/24/2021   Hepatitis A 07/24/2021   Hypo-osmolality and hyponatremia 07/24/2021   Impotence of organic origin 07/24/2021   Inflammation of joint of right shoulder region 07/24/2021   Low back pain, unspecified 07/24/2021   Need for assistance with personal care 07/24/2021   Neoplasm of uncertain behavior of skin 07/24/2021   Nummular dermatitis 07/24/2021   Occlusion and stenosis of right carotid artery 07/24/2021   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   Osteoarthritis of bilateral glenohumeral joints 07/24/2021   Other abnormalities of gait and mobility 07/24/2021   Other acute postprocedural pain 07/24/2021   Other ill-defined and unknown causes of morbidity and mortality  07/24/2021   Other osteonecrosis, unspecified femur (HCC) 07/24/2021   Other secondary cataract, right eye 07/24/2021   Other specified abnormal findings of blood chemistry 07/24/2021   Pathological fracture, hip, unspecified, initial encounter for fracture (HCC) 07/24/2021   Personal history of noncompliance with medical treatment, presenting hazards to health 07/24/2021   Polymyalgia rheumatica (HCC) 07/24/2021   Pain in left hip 07/24/2021   Problem related to unspecified psychosocial circumstances 07/24/2021   Psychological and behavioral factors associated with disorders or diseases classified elsewhere 07/24/2021   Rotator cuff tear arthropathy 07/24/2021   Senile nuclear sclerosis 07/24/2021   Spondylosis without myelopathy or radiculopathy, lumbar region 07/24/2021   Syncope and collapse 07/24/2021   Undiagnosed cardiac murmurs 07/24/2021   Unilateral primary osteoarthritis, left hip 07/24/2021   CAD (coronary artery disease) 07/31/2020   Atrial fibrillation, chronic (HCC) 07/31/2020   COPD (chronic obstructive pulmonary disease) (HCC) 07/31/2020   Hyperlipidemia 02/12/2010   Essential hypertension 02/12/2010   PCP:  [facility]:   [facility] [place], [place] - [address] [place] [address] Phone: [phone] Fax: [phone]  [id number] [place], [place] - [address] [place] [address] Phone: [phone] Fax: [phone]     Social Determinants of Health (SDOH) Social History: SDOH Screenings  Food Insecurity: No Food Insecurity (03/04/2023)  Housing: Low Risk  (03/04/2023)  Transportation Needs: No Transportation Needs (03/04/2023)  Utilities: Not At Risk (03/04/2023)  Tobacco Use: Low Risk  (03/04/2023)   SDOH Interventions:     Readmission Risk Interventions    03/04/2023   11:13 AM 01/23/2023   11:20 AM  Readmission Risk Prevention Plan  Transportation Screening  Complete Complete  PCP or Specialist Appt within 3-5 Days Complete   Social Work Consult for Recovery Care Planning/Counseling Complete   Palliative Care Screening Not Applicable   Medication Review Oceanographer) Complete Complete  PCP or Specialist appointment within 3-5 days of discharge  Complete  SW Recovery Care/Counseling Consult  Complete  Palliative Care Screening  Not Applicable  Skilled Nursing Facility  Not Applicable

## 2023-03-04 NOTE — H&P (Signed)
History and Physical    Patient: Kenneth Stevenson DGU:[id number] DOB: [birth year] DOA: 03/03/2023 DOS: the patient was seen and examined on 03/04/2023 PCP: [facility]  Patient coming from: Home  Chief Complaint:  Chief Complaint  Patient presents with   Groin Swelling    HPI: Kenneth Stevenson is a 77 y.o. male with medical history significant for alcohol abuse, cirrhosis, HTN, , CAD status post CABG, CHF, A-fib on Eliquis, who presents to the ED with fluid overload.  He is had increasing swelling in his legs and in the past day it is now up to his scrotum.  He is compliant with his diuretics.  He denies shortness of breath beyond his baseline.  Patient was hospitalized from 5/29 to 6/2 for the same and required Lasix infusion. ED course and data review: Vitals within normal limits.Labs at baseline with macrocytic anemia with hemoglobin of 8, platelets 85.  Total bilirubin 4.9 with normal transaminases, alk phos 148.  Hyponatremia of 122, baseline around 126.  Troponin 13 and BNP 608.  Ammonia 27. EKG, personally viewed and interpreted showing A-fib at 94 with no acute ST-T wave changes.  Chest x-ray showing mild cardiomegaly with mild interstitial edema Scrotal ultrasound with bilateral scrotal edema Patient given Lasix IV 80 mg. Hospitalist consulted for admission.     Past Medical History:  Diagnosis Date   Atrial fibrillation (HCC)    CHF (congestive heart failure) (HCC)    Cirrhosis (HCC)    COPD (chronic obstructive pulmonary disease) (HCC)    Hypertension    S/P CABG x 3    Past Surgical History:  Procedure Laterality Date   ESOPHAGOGASTRODUODENOSCOPY (EGD) WITH PROPOFOL N/A 12/03/2022   Procedure: ESOPHAGOGASTRODUODENOSCOPY (EGD) WITH PROPOFOL;  Surgeon: Jaynie Collins, DO;  Location: [facility] ENDOSCOPY;  Service: Gastroenterology;  Laterality: N/A;   Social History:  reports that he has never smoked. He has never used smokeless tobacco. He reports current alcohol  use. He reports that he does not use drugs.  Allergies  Allergen Reactions   Simvastatin    Empagliflozin Other (See Comments)    Other reaction(s): Tremor, Disorientated, Increased frequency of urination    Family History  Problem Relation Age of Onset   Dementia Sister     Prior to Admission medications   Medication Sig Start Date End Date Taking? Authorizing Provider  acetaminophen (TYLENOL) 325 MG tablet Take 975 mg by mouth every 6 (six) hours as needed for mild pain.    [provider]  albuterol (PROVENTIL HFA;VENTOLIN HFA) 108 (90 Base) MCG/ACT inhaler Inhale 1 puff into the lungs every 6 (six) hours as needed for wheezing or shortness of breath.    [provider]  allopurinol (ZYLOPRIM) 100 MG tablet Take 200 mg by mouth daily.    [provider]  apixaban (ELIQUIS) 5 MG TABS tablet Take 5 mg by mouth daily. Per Patient    [provider]  carvedilol (COREG) 12.5 MG tablet Take 12.5 mg by mouth 2 (two) times daily with a meal.    [provider]  cyanocobalamin 100 MCG tablet Take 1 tablet by mouth daily. 06/24/05   [provider]  Emollient (EUCERIN) lotion Apply 1 Application topically as needed for dry skin.    [provider]  folic acid (FOLVITE) 1 MG tablet Take 1 tablet (1 mg total) by mouth daily. 01/26/23   [name], MD  guaiFENesin (MUCINEX) 600 MG 12 hr tablet Take 600 mg by mouth 2 (two) times  daily.    [provider]  lactulose (CHRONULAC) 10 GM/15ML solution Take 30 mLs (20 g total) by mouth 2 (two) times daily. 01/25/23   [name], MD  magnesium oxide (MAG-OX) 400 MG tablet Take 400 mg by mouth 2 (two) times daily. 09/16/22   [provider]  Multiple Vitamin (MULTIVITAMIN WITH MINERALS) TABS tablet Take 1 tablet by mouth daily. 01/26/23   [name], MD  naltrexone (DEPADE) 50 MG tablet Take 50 mg by mouth daily.    [provider]  omeprazole (PRILOSEC) 20 MG capsule  Take 20 mg by mouth daily. Per Pt    [provider]  potassium chloride SA (KLOR-CON M) 20 MEQ tablet Take 1 tablet (20 mEq total) by mouth daily. 01/25/23   [name], MD  pyridOXINE (VITAMIN B-6) 100 MG tablet Take 100 mg by mouth daily.    [provider]  sodium chloride (OCEAN) 0.65 % SOLN nasal spray Place 1 spray into both nostrils as needed for congestion.    [provider]  thiamine (VITAMIN B1) 100 MG tablet Take 1 tablet (100 mg total) by mouth daily. 01/26/23   [name], MD  Tiotropium Bromide-Olodaterol (STIOLTO RESPIMAT) 2.5-2.5 MCG/ACT AERS Inhale 2 each into the lungs daily.    [provider]  torsemide (DEMADEX) 20 MG tablet Take 1 tablet (20 mg total) by mouth daily. 01/26/23   [name], MD    Physical Exam: Vitals:   03/03/23 2330 03/04/23 0045 03/04/23 0145 03/04/23 0200  BP: (!) 143/79  130/83 124/79  Pulse: 91 93 93 82  Resp: 15 20 18 20   Temp:      TempSrc:      SpO2:       Physical Exam Vitals and nursing note reviewed.  Constitutional:      General: He is not in acute distress. HENT:     Head: Normocephalic and atraumatic.  Cardiovascular:     Rate and Rhythm: Normal rate and regular rhythm.     Heart sounds: Normal heart sounds.  Pulmonary:     Effort: Pulmonary effort is normal.     Breath sounds: Normal breath sounds.  Abdominal:     Palpations: Abdomen is soft.     Tenderness: There is no abdominal tenderness.  Musculoskeletal:     Right lower leg: Edema present.     Left lower leg: Edema present.  Neurological:     Mental Status: Mental status is at baseline.     Labs on Admission: I have personally reviewed following labs and imaging studies  CBC: Recent Labs  Lab 03/03/23 2114  WBC 4.3  HGB 8.0*  HCT 23.1*  MCV 107.4*  PLT 85*   Basic Metabolic Panel: Recent Labs  Lab 03/03/23 2114  NA 122*  K 3.9  CL 92*  CO2 22  GLUCOSE 107*  BUN 13  CREATININE 0.87  CALCIUM 8.8*   GFR: CrCl  cannot be calculated (Unknown ideal weight.). Liver Function Tests: Recent Labs  Lab 03/03/23 2114  AST 35  ALT 17  ALKPHOS 148*  BILITOT 4.9*  PROT 8.0  ALBUMIN 3.2*   No results for input(s): "LIPASE", "AMYLASE" in the last 168 hours. Recent Labs  Lab 03/04/23 0143  AMMONIA 27   Coagulation Profile: No results for input(s): "INR", "PROTIME" in the last 168 hours. Cardiac Enzymes: No results for input(s): "CKTOTAL", "CKMB", "CKMBINDEX", "TROPONINI" in the last 168 hours. BNP (last 3 results) No results for input(s): "PROBNP" in the  last 8760 hours. HbA1C: No results for input(s): "HGBA1C" in the last 72 hours. CBG: No results for input(s): "GLUCAP" in the last 168 hours. Lipid Profile: No results for input(s): "CHOL", "HDL", "LDLCALC", "TRIG", "CHOLHDL", "LDLDIRECT" in the last 72 hours. Thyroid Function Tests: No results for input(s): "TSH", "T4TOTAL", "FREET4", "T3FREE", "THYROIDAB" in the last 72 hours. Anemia Panel: No results for input(s): "VITAMINB12", "FOLATE", "FERRITIN", "TIBC", "IRON", "RETICCTPCT" in the last 72 hours. Urine analysis:    Component Value Date/Time   COLORURINE YELLOW (A) 08/29/2022 0906   APPEARANCEUR CLEAR (A) 08/29/2022 0906   LABSPEC 1.006 08/29/2022 0906   PHURINE 8.0 08/29/2022 0906   GLUCOSEU NEGATIVE 08/29/2022 0906   HGBUR NEGATIVE 08/29/2022 0906   BILIRUBINUR NEGATIVE 08/29/2022 0906   KETONESUR NEGATIVE 08/29/2022 0906   PROTEINUR NEGATIVE 08/29/2022 0906   NITRITE NEGATIVE 08/29/2022 0906   LEUKOCYTESUR NEGATIVE 08/29/2022 0906    Radiological Exams on Admission: US SCROTUM W/DOPPLER  Result Date: 03/03/2023 CLINICAL DATA:  Scrotal pain and swelling. EXAM: SCROTAL ULTRASOUND DOPPLER ULTRASOUND OF THE TESTICLES TECHNIQUE: Complete ultrasound examination of the testicles, epididymis, and other scrotal structures was performed. Color and spectral Doppler ultrasound were also utilized to evaluate blood flow to the testicles.  COMPARISON:  None Available. FINDINGS: Right testicle Measurements: 3.2 cm x 2.3 cm x 1.9 cm. No mass or microlithiasis visualized. Left testicle Measurements: 3.1 cm x 2.2 cm x 2.2 cm. No mass or microlithiasis visualized. Right epididymis:  Normal in size and appearance. Left epididymis:  Normal in size and appearance. Hydrocele:  None visualized. Varicocele:  None visualized. Pulsed Doppler interrogation of both testes demonstrates normal low resistance arterial and venous waveforms bilaterally. Other: Diffuse scrotal edema is noted. IMPRESSION: 1. Bilateral scrotal edema. Electronically Signed   By: Aram Candela M.D.   On: 03/03/2023 23:27   DG Chest 2 View  Result Date: 03/03/2023 CLINICAL DATA:  Bilateral lower extremity edema. EXAM: CHEST - 2 VIEW COMPARISON:  Chest radiograph dated 01/21/2023. FINDINGS: There is background of chronic interstitial coarsening. There is mild cardiomegaly with mild interstitial edema. Trace right pleural effusion suspected. No pneumothorax. Median sternotomy wires. No acute osseous pathology. Degenerative changes of the spine and shoulders. IMPRESSION: Mild cardiomegaly with mild interstitial edema. Electronically Signed   By: Elgie Collard M.D.   On: 03/03/2023 21:40     Data Reviewed: Relevant notes from primary care and specialist visits, past discharge summaries as available in EHR, including Care Everywhere. Prior diagnostic testing as pertinent to current admission diagnoses Updated medications and problem lists for reconciliation ED course, including vitals, labs, imaging, treatment and response to treatment Triage notes, nursing and pharmacy notes and ED provider's notes Notable results as noted in HPI   Assessment and Plan:   Anasarca Acute congestive heart failure with preserved ejection fraction, 60% - Clinically overloaded, BNP over 600 and chest x-ray showing interstitial edema -Echocardiogram in May with EF of 60%.   -IV Lasix and can  consider Lasix drip - Daily weights with intake and output monitoring  Hyponatremia:  - Sodium 122, secondary to fluid overload, alcohol and cirrhosis - Fluid restrict - Nephrology consulted  Alcohol abuse -CIWA protocol--    Alcoholic cirrhosis (HCC):  -Ammonia level normal.    CAD (coronary artery disease):  No chest pain, patient's not -Observe closely   Atrial fibrillation, chronic (HCC): -Continue Coreg and Eliquis   Essential hypertension Coreg.   COPD (chronic obstructive pulmonary disease) (HCC): No wheezing -bronchodilators   Gout -Allopurinol  Barrett's esophagus -Protonix   Thrombocytopenia (HCC):  -Appears to be chronic related to cirrhosis   No evidence of bleeding. -Counts remains stable   Macrocytic anemia: -Hb stable.        DVT prophylaxis: Eliquis  Consults: Nephrology  Advance Care Planning:   Code Status: Prior   Family Communication: none  Disposition Plan: Back to previous home environment  Severity of Illness: The appropriate patient status for this patient is INPATIENT. Inpatient status is judged to be reasonable and necessary in order to provide the required intensity of service to ensure the patient's safety. The patient's presenting symptoms, physical exam findings, and initial radiographic and laboratory data in the context of their chronic comorbidities is felt to place them at high risk for further clinical deterioration. Furthermore, it is not anticipated that the patient will be medically stable for discharge from the hospital within 2 midnights of admission.   * I certify that at the point of admission it is my clinical judgment that the patient will require inpatient hospital care spanning beyond 2 midnights from the point of admission due to high intensity of service, high risk for further deterioration and high frequency of surveillance required.*  Author: Andris Baumann, MD 03/04/2023 2:20 AM  For on call review  [email or web address].

## 2023-03-04 NOTE — Progress Notes (Signed)
[facility]  ROUNDING NOTE   Subjective:   Kenneth Stevenson  is a 77 y.o. male with past medical conditions including GERD, hypertension, alcoholic liver cirrhosis, hyperlipidemia, CHF, CAD, and atrial fibrillation on Eliquis. Patient presented to the ED with a swollen scrotum and lower extremities. He has been admitted for Anasarca [R60.1] Acute on chronic congestive heart failure, unspecified heart failure type (HCC) [I50.9]  Patient is known to our practice from previous admission. He is seen sitting up in bed Alert and oriented Denies nausea or vomiting prior to ED arrival. Only states he developed swelling in his genital area and legs. Does report mild tremors and weakness in both hands.   Labs on ED arrival include sodium 122, albumin 3.2, and BNP 608 and hgb 8.0. Treating with IV furosemide. Chest xray shows mild interstitial edema. Scrotal US shows edema.   We have been consulted to help manage hyponatremia.   Objective:  Vital signs in last 24 hours:  Temp:  [98 F (36.7 C)-98.2 F (36.8 C)] 98.2 F (36.8 C) (07/10 1246) Pulse Rate:  [54-98] 85 (07/10 1246) Resp:  [15-21] 16 (07/10 1246) BP: (111-143)/(60-83) 117/61 (07/10 1246) SpO2:  [92 %-94 %] 92 % (07/10 1246)  Weight change:  There were no vitals filed for this visit.  Intake/Output: I/O last 3 completed shifts: In: -  Out: 2300 [Urine:2300]   Intake/Output this shift:  Total I/O In: -  Out: 1100 [Urine:1100]  Physical Exam: General: NAD  Head: Normocephalic, atraumatic. Moist oral mucosal membranes  Eyes: Anicteric  Lungs:  Clear to auscultation  Heart: Regular rate and rhythm  Abdomen:  Soft, nontender,   Extremities:  2+ peripheral edema. Scrotal edema  Neurologic: Nonfocal, moving all four extremities  Skin: No lesions  Access: None    Basic Metabolic Panel: Recent Labs  Lab 03/03/23 2114 03/04/23 0732  NA 122* 127*  K 3.9 3.6  CL 92* 95*  CO2 22 21*  GLUCOSE 107* 90  BUN  13 12  CREATININE 0.87 0.82  CALCIUM 8.8* 8.7*    Liver Function Tests: Recent Labs  Lab 03/03/23 2114  AST 35  ALT 17  ALKPHOS 148*  BILITOT 4.9*  PROT 8.0  ALBUMIN 3.2*   No results for input(s): "LIPASE", "AMYLASE" in the last 168 hours. Recent Labs  Lab 03/04/23 0143  AMMONIA 27    CBC: Recent Labs  Lab 03/03/23 2114 03/04/23 0732  WBC 4.3 4.5  HGB 8.0* 7.7*  HCT 23.1* 21.7*  MCV 107.4* 104.3*  PLT 85* 72*    Cardiac Enzymes: No results for input(s): "CKTOTAL", "CKMB", "CKMBINDEX", "TROPONINI" in the last 168 hours.  BNP: Invalid input(s): "POCBNP"  CBG: No results for input(s): "GLUCAP" in the last 168 hours.  Microbiology: Results for orders placed or performed during the hospital encounter of 08/29/22  Resp panel by RT-PCR (RSV, Flu A&B, Covid) Anterior Nasal Swab     Status: None   Collection Time: 08/29/22  6:35 AM   Specimen: Anterior Nasal Swab  Result Value Ref Range Status   SARS Coronavirus 2 by RT PCR NEGATIVE NEGATIVE Final    Comment: (NOTE) SARS-CoV-2 target nucleic acids are NOT DETECTED.  The SARS-CoV-2 RNA is generally detectable in upper respiratory specimens during the acute phase of infection. The lowest concentration of SARS-CoV-2 viral copies this assay can detect is 138 copies/mL. A negative result does not preclude SARS-Cov-2 infection and should not be used as the sole basis for treatment or other patient management  decisions. A negative result may occur with  improper specimen collection/handling, submission of specimen other than nasopharyngeal swab, presence of viral mutation(s) within the areas targeted by this assay, and inadequate number of viral copies(<138 copies/mL). A negative result must be combined with clinical observations, patient history, and epidemiological information. The expected result is Negative.  Fact Sheet for Patients:  BloggerCourse.com  Fact Sheet for Healthcare  Providers:  SeriousBroker.it  This test is no t yet approved or cleared by the [place] FDA and  has been authorized for detection and/or diagnosis of SARS-CoV-2 by FDA under an Emergency Use Authorization (EUA). This EUA will remain  in effect (meaning this test can be used) for the duration of the COVID-19 declaration under Section 564(b)(1) of the Act, 21 U.S.C.section 360bbb-3(b)(1), unless the authorization is terminated  or revoked sooner.       Influenza A by PCR NEGATIVE NEGATIVE Final   Influenza B by PCR NEGATIVE NEGATIVE Final    Comment: (NOTE) The Xpert Xpress SARS-CoV-2/FLU/RSV plus assay is intended as an aid in the diagnosis of influenza from Nasopharyngeal swab specimens and should not be used as a sole basis for treatment. Nasal washings and aspirates are unacceptable for Xpert Xpress SARS-CoV-2/FLU/RSV testing.  Fact Sheet for Patients: BloggerCourse.com  Fact Sheet for Healthcare Providers: SeriousBroker.it  This test is not yet approved or cleared by the [place] FDA and has been authorized for detection and/or diagnosis of SARS-CoV-2 by FDA under an Emergency Use Authorization (EUA). This EUA will remain in effect (meaning this test can be used) for the duration of the COVID-19 declaration under Section 564(b)(1) of the Act, 21 U.S.C. section 360bbb-3(b)(1), unless the authorization is terminated or revoked.     Resp Syncytial Virus by PCR NEGATIVE NEGATIVE Final    Comment: (NOTE) Fact Sheet for Patients: BloggerCourse.com  Fact Sheet for Healthcare Providers: SeriousBroker.it  This test is not yet approved or cleared by the [place] FDA and has been authorized for detection and/or diagnosis of SARS-CoV-2 by FDA under an Emergency Use Authorization (EUA). This EUA will remain in effect (meaning this test can be  used) for the duration of the COVID-19 declaration under Section 564(b)(1) of the Act, 21 U.S.C. section 360bbb-3(b)(1), unless the authorization is terminated or revoked.  Performed at [facility], [address] [place], [place] [address]   Blood culture (routine single)     Status: None   Collection Time: 08/29/22  7:37 AM   Specimen: BLOOD  Result Value Ref Range Status   Specimen Description BLOOD  LEFT [facility]  Final   Special Requests   Final    BOTTLES DRAWN AEROBIC AND ANAEROBIC Blood Culture adequate volume   Culture   Final    NO GROWTH 5 DAYS Performed at [facility], [address] [place], [place] [address]    Report Status 09/03/2022 FINAL  Final  Urine Culture     Status: None   Collection Time: 08/29/22  9:06 AM   Specimen: In/Out Cath Urine  Result Value Ref Range Status   Specimen Description   Final    IN/OUT CATH URINE Performed at [facility], [address] [place], [place] [address]    Special Requests   Final    NONE Performed at [facility], [address] [place], [place] [address]    Culture   Final    NO GROWTH Performed at [facility], [address], [place], [place] [address]  Report Status 08/30/2022 FINAL  Final    Coagulation Studies: No results for input(s): "LABPROT", "INR" in the last 72 hours.  Urinalysis: No results for input(s): "COLORURINE", "LABSPEC", "PHURINE", "GLUCOSEU", "HGBUR", "BILIRUBINUR", "KETONESUR", "PROTEINUR", "UROBILINOGEN", "NITRITE", "LEUKOCYTESUR" in the last 72 hours.  Invalid input(s): "APPERANCEUR"    Imaging: US SCROTUM W/DOPPLER  Result Date: 03/03/2023 CLINICAL DATA:  Scrotal pain and swelling. EXAM: SCROTAL ULTRASOUND DOPPLER ULTRASOUND OF THE TESTICLES TECHNIQUE: Complete ultrasound examination of the testicles, epididymis, and other scrotal structures was performed. Color and spectral Doppler ultrasound were also utilized to evaluate blood flow to  the testicles. COMPARISON:  None Available. FINDINGS: Right testicle Measurements: 3.2 cm x 2.3 cm x 1.9 cm. No mass or microlithiasis visualized. Left testicle Measurements: 3.1 cm x 2.2 cm x 2.2 cm. No mass or microlithiasis visualized. Right epididymis:  Normal in size and appearance. Left epididymis:  Normal in size and appearance. Hydrocele:  None visualized. Varicocele:  None visualized. Pulsed Doppler interrogation of both testes demonstrates normal low resistance arterial and venous waveforms bilaterally. Other: Diffuse scrotal edema is noted. IMPRESSION: 1. Bilateral scrotal edema. Electronically Signed   By: Aram Candela M.D.   On: 03/03/2023 23:27   DG Chest 2 View  Result Date: 03/03/2023 CLINICAL DATA:  Bilateral lower extremity edema. EXAM: CHEST - 2 VIEW COMPARISON:  Chest radiograph dated 01/21/2023. FINDINGS: There is background of chronic interstitial coarsening. There is mild cardiomegaly with mild interstitial edema. Trace right pleural effusion suspected. No pneumothorax. Median sternotomy wires. No acute osseous pathology. Degenerative changes of the spine and shoulders. IMPRESSION: Mild cardiomegaly with mild interstitial edema. Electronically Signed   By: Elgie Collard M.D.   On: 03/03/2023 21:40     Medications:     arformoterol  15 mcg Nebulization BID   And   umeclidinium bromide  1 puff Inhalation Daily   carvedilol  12.5 mg Oral BID WC   folic acid  1 mg Oral Daily   furosemide  40 mg Intravenous Q12H   lactulose  20 g Oral BID   magnesium oxide  400 mg Oral BID   multivitamin with minerals  1 tablet Oral Daily   pantoprazole  40 mg Oral Daily   thiamine  100 mg Oral Daily   Or   thiamine  100 mg Intravenous Daily   cyanocobalamin  100 mcg Oral Daily   acetaminophen **OR** acetaminophen, albuterol, LORazepam **OR** LORazepam, morphine injection, ondansetron **OR** ondansetron (ZOFRAN) IV, oxyCODONE  Assessment/ Plan:  Mr. Kenneth Stevenson is a 77 y.o.   male with past medical conditions including GERD, hypertension, alcoholic liver cirrhosis, hyperlipidemia, CHF, CAD, and atrial fibrillation on Eliquis  Patient has been admitted for Anasarca [R60.1] Acute on chronic congestive heart failure, unspecified heart failure type (HCC) [I50.9]   Hyponatremia likely secondary to increased free water. Sodium on ED arrival 122. Patient given IV furosemide 40 mg. Sodium 127 today. Continue IV Furosemide 40 mg twice daily. Encouraged fluid restriction.     LOS: 0 Leslea Vowles 7/10/20241:03 PM

## 2023-03-04 NOTE — Progress Notes (Signed)
  Progress Note   Patient: Kenneth Stevenson ZOX:[id number] DOB: 1946/01/31 DOA: 03/03/2023     0 DOS: the patient was seen and examined on 03/04/2023   Brief hospital course: Kenneth Stevenson is a 77 y.o. male with medical history significant for alcohol abuse, cirrhosis, HTN, , CAD status post CABG, CHF, A-fib on Eliquis, who presents to the ED with fluid overload.  He is had increasing swelling in his legs and in the past day it is now up to his scrotum.  Increase edema appears to be secondary to congestive heart failure exacerbation.  He was started on IV Lasix.   Principal Problem:   Anasarca Active Problems:   Hyponatremia   Alcoholic cirrhosis of liver with ascites (HCC)   Atrial fibrillation, chronic (HCC)   Essential hypertension   COPD (chronic obstructive pulmonary disease) (HCC)   Acute on chronic diastolic CHF (congestive heart failure) (HCC)   Pancytopenia (HCC)   Assessment and Plan: Anasarca Acute congestive heart failure with preserved ejection fraction, 60% -Echocardiogram in May with EF of 60%.   Patient started on IV Lasix, appears to be diuresing well.  Continue to monitor electrolytes.   Hyponatremia:  Metabolic acidosis. Sodium level is better after giving Lasix.  Continue to follow.   Alcohol abuse Alcoholic cirrhosis (HCC):  -Ammonia level normal.  Advised to quit.   CAD (coronary artery disease):  Troponin only minimally elevated, patient has complaints of mild chest pain.  But this does not appear to be due to non-STEMI.   Atrial fibrillation, chronic (HCC): On Coreg and Eliquis Hemoglobin has been low, patient does not have any stool or rectal bleeding.  However, due to significant drop in hemoglobin of 7.7, I will hold off Eliquis at this point.  Pancytopenia. Patient hemoglobin had dropped down to 7.7 today, hold off anticoagulation.  Follow hemoglobin every 8 hours, transfuse as needed. Check iron B12 level. Reviewed outside labs, patient does not  have any CBC or hemoglobin for the last 10 years.  Continue to follow   Essential hypertension Coreg.   COPD (chronic obstructive pulmonary disease) (HCC): No wheezing -bronchodilators   Gout -Allopurinol   Barrett's esophagus -Protonix          Subjective:  Patient feel better today, denies any black stool or rectal bleeding.  Physical Exam: Vitals:   03/04/23 0645 03/04/23 0700 03/04/23 0731 03/04/23 0808  BP:  130/65  135/70  Pulse:  91  97  Resp: 17 (!) 21  20  Temp:   98 F (36.7 C) 98 F (36.7 C)  TempSrc:   Oral Oral  SpO2:    94%   General exam: Appears calm and comfortable  Respiratory system: Clear to auscultation. Respiratory effort normal. Cardiovascular system: Irregular. No JVD, murmurs, rubs, gallops or clicks.  Gastrointestinal system: Abdomen is nondistended, soft and nontender. No organomegaly or masses felt. Normal bowel sounds heard. Central nervous system: Alert and oriented. No focal neurological deficits. Extremities: 2-3+ leg edema Skin: No rashes, lesions or ulcers Psychiatry: Judgement and insight appear normal. Mood & affect appropriate.    Data Reviewed:  Review lab results, ultrasound and chest x-ray results.  Family Communication:   Disposition: Status is: Inpatient Remains inpatient appropriate because: Severity of disease.  IV treatment.     Time spent: No charge minutes  Author: Marrion Coy, MD 03/04/2023 12:02 PM  For on call review [email or web address].

## 2023-03-04 NOTE — Hospital Course (Signed)
Kenneth Stevenson is a 78 y.o. male with medical history significant for alcohol abuse, cirrhosis, HTN, , CAD status post CABG, CHF, A-fib on Eliquis, who presents to the ED with fluid overload.  He is had increasing swelling in his legs and in the past day it is now up to his scrotum.  Increase edema appears to be secondary to congestive heart failure exacerbation.  He was started on IV Lasix.

## 2023-03-05 ENCOUNTER — Inpatient Hospital Stay: Payer: No Typology Code available for payment source

## 2023-03-05 DIAGNOSIS — R601 Generalized edema: Secondary | ICD-10-CM

## 2023-03-05 DIAGNOSIS — I5033 Acute on chronic diastolic (congestive) heart failure: Secondary | ICD-10-CM

## 2023-03-05 DIAGNOSIS — D61818 Other pancytopenia: Secondary | ICD-10-CM

## 2023-03-05 LAB — BASIC METABOLIC PANEL
Anion gap: 6 (ref 5–15)
BUN: 15 mg/dL (ref 8–23)
CO2: 26 mmol/L (ref 22–32)
Calcium: 8.5 mg/dL — ABNORMAL LOW (ref 8.9–10.3)
Chloride: 94 mmol/L — ABNORMAL LOW (ref 98–111)
Creatinine, Ser: 1.06 mg/dL (ref 0.61–1.24)
GFR, Estimated: >60 mL/min (ref >60)
Glucose, Bld: 122 mg/dL — ABNORMAL HIGH (ref 70–99)
Potassium: 3.5 mmol/L (ref 3.5–5.1)
Sodium: 126 mmol/L — ABNORMAL LOW (ref 135–145)

## 2023-03-05 LAB — CBC
HCT: 20.9 % — ABNORMAL LOW (ref 39.0–52.0)
Hemoglobin: 7.5 g/dL — ABNORMAL LOW (ref 13.0–17.0)
MCH: 37.9 pg — ABNORMAL HIGH (ref 26.0–34.0)
MCHC: 35.9 g/dL (ref 30.0–36.0)
MCV: 105.6 fL — ABNORMAL HIGH (ref 80.0–100.0)
Platelets: 65 10*3/uL — ABNORMAL LOW (ref 150–400)
RBC: 1.98 MIL/uL — ABNORMAL LOW (ref 4.22–5.81)
RDW: 21.8 % — ABNORMAL HIGH (ref 11.5–15.5)
WBC: 4.5 10*3/uL (ref 4.0–10.5)
nRBC: 0 % (ref 0.0–0.2)

## 2023-03-05 LAB — DIRECT ANTIGLOBULIN TEST (NOT AT ARMC)
DAT, IgG: NEGATIVE
DAT, complement: NEGATIVE

## 2023-03-05 LAB — HEMOGLOBIN
Hemoglobin: 7.5 g/dL — ABNORMAL LOW (ref 13.0–17.0)
Hemoglobin: 7.9 g/dL — ABNORMAL LOW (ref 13.0–17.0)

## 2023-03-05 LAB — LACTATE DEHYDROGENASE: LDH: 234 U/L — ABNORMAL HIGH (ref 98–192)

## 2023-03-05 LAB — FOLATE: Folate: 35 ng/mL (ref >5.9)

## 2023-03-05 LAB — PHOSPHORUS: Phosphorus: 3.2 mg/dL (ref 2.5–4.6)

## 2023-03-05 LAB — MAGNESIUM: Magnesium: 1.8 mg/dL (ref 1.7–2.4)

## 2023-03-05 MED ORDER — SODIUM CHLORIDE 1 G PO TABS
1.0000 g | ORAL_TABLET | Freq: Three times a day (TID) | ORAL | Status: DC
  Administered 2023-03-05 – 2023-03-07 (×7): 1 g via ORAL
  Filled 2023-03-05 (×7): qty 1

## 2023-03-05 NOTE — Progress Notes (Signed)
[facility]  ROUNDING NOTE   Subjective:   Kenneth Stevenson  is a 77 y.o. male with past medical conditions including GERD, hypertension, alcoholic liver cirrhosis, hyperlipidemia, CHF, CAD, and atrial fibrillation on Eliquis. Patient presented to the ED with a swollen scrotum and lower extremities. He has been admitted for Anasarca [R60.1] Acute on chronic congestive heart failure, unspecified heart failure type (HCC) [I50.9]  Patient is known to our practice from previous admission.  Patient laying in bed Alert and oriented  States he drinks less alcohol since last admission Tolerating small meals  Sodium 126  Objective:  Vital signs in last 24 hours:  Temp:  [98.1 F (36.7 C)-98.7 F (37.1 C)] 98.7 F (37.1 C) (07/11 1206) Pulse Rate:  [70-91] 88 (07/11 1206) Resp:  [16-25] 25 (07/11 1206) BP: (97-114)/(50-67) 105/55 (07/11 1206) SpO2:  [84 %-94 %] 92 % (07/11 1206) Weight:  [75.3 kg] 75.3 kg (07/11 0348)  Weight change:  Filed Weights   03/05/23 0348  Weight: 75.3 kg    Intake/Output: I/O last 3 completed shifts: In: -  Out: 3400 [Urine:3400]   Intake/Output this shift:  Total I/O In: 720 [P.O.:720] Out: -   Physical Exam: General: NAD  Head: Normocephalic, atraumatic. Moist oral mucosal membranes  Eyes: Anicteric  Lungs:  Clear to auscultation  Heart: Regular rate and rhythm  Abdomen:  Soft, nontender,   Extremities:  2+ peripheral edema. Scrotal edema (Improving)  Neurologic: Nonfocal, moving all four extremities  Skin: No lesions  Access: None    Basic Metabolic Panel: Recent Labs  Lab 03/03/23 2114 03/04/23 0732 03/05/23 0450  NA 122* 127* 126*  K 3.9 3.6 3.5  CL 92* 95* 94*  CO2 22 21* 26  GLUCOSE 107* 90 122*  BUN 13 12 15   CREATININE 0.87 0.82 1.06  CALCIUM 8.8* 8.7* 8.5*  MG  --   --  1.8  PHOS  --   --  3.2    Liver Function Tests: Recent Labs  Lab 03/03/23 2114  AST 35  ALT 17  ALKPHOS 148*  BILITOT 4.9*   PROT 8.0  ALBUMIN 3.2*   No results for input(s): "LIPASE", "AMYLASE" in the last 168 hours. Recent Labs  Lab 03/04/23 0143  AMMONIA 27    CBC: Recent Labs  Lab 03/03/23 2114 03/04/23 0732 03/04/23 1124 03/04/23 2001 03/05/23 0450 03/05/23 0451  WBC 4.3 4.5  --   --  4.5  --   HGB 8.0* 7.7* 7.9* 7.8* 7.5* 7.5*  HCT 23.1* 21.7*  --   --  20.9*  --   MCV 107.4* 104.3*  --   --  105.6*  --   PLT 85* 72*  --   --  65*  --     Cardiac Enzymes: No results for input(s): "CKTOTAL", "CKMB", "CKMBINDEX", "TROPONINI" in the last 168 hours.  BNP: Invalid input(s): "POCBNP"  CBG: No results for input(s): "GLUCAP" in the last 168 hours.  Microbiology: Results for orders placed or performed during the hospital encounter of 08/29/22  Resp panel by RT-PCR (RSV, Flu A&B, Covid) Anterior Nasal Swab     Status: None   Collection Time: 08/29/22  6:35 AM   Specimen: Anterior Nasal Swab  Result Value Ref Range Status   SARS Coronavirus 2 by RT PCR NEGATIVE NEGATIVE Final    Comment: (NOTE) SARS-CoV-2 target nucleic acids are NOT DETECTED.  The SARS-CoV-2 RNA is generally detectable in upper respiratory specimens during the acute phase of infection. The  lowest concentration of SARS-CoV-2 viral copies this assay can detect is 138 copies/mL. A negative result does not preclude SARS-Cov-2 infection and should not be used as the sole basis for treatment or other patient management decisions. A negative result may occur with  improper specimen collection/handling, submission of specimen other than nasopharyngeal swab, presence of viral mutation(s) within the areas targeted by this assay, and inadequate number of viral copies(<138 copies/mL). A negative result must be combined with clinical observations, patient history, and epidemiological information. The expected result is Negative.  Fact Sheet for Patients:  BloggerCourse.com  Fact Sheet for Healthcare  Providers:  SeriousBroker.it  This test is no t yet approved or cleared by the [place] FDA and  has been authorized for detection and/or diagnosis of SARS-CoV-2 by FDA under an Emergency Use Authorization (EUA). This EUA will remain  in effect (meaning this test can be used) for the duration of the COVID-19 declaration under Section 564(b)(1) of the Act, 21 U.S.C.section 360bbb-3(b)(1), unless the authorization is terminated  or revoked sooner.       Influenza A by PCR NEGATIVE NEGATIVE Final   Influenza B by PCR NEGATIVE NEGATIVE Final    Comment: (NOTE) The Xpert Xpress SARS-CoV-2/FLU/RSV plus assay is intended as an aid in the diagnosis of influenza from Nasopharyngeal swab specimens and should not be used as a sole basis for treatment. Nasal washings and aspirates are unacceptable for Xpert Xpress SARS-CoV-2/FLU/RSV testing.  Fact Sheet for Patients: BloggerCourse.com  Fact Sheet for Healthcare Providers: SeriousBroker.it  This test is not yet approved or cleared by the [place] FDA and has been authorized for detection and/or diagnosis of SARS-CoV-2 by FDA under an Emergency Use Authorization (EUA). This EUA will remain in effect (meaning this test can be used) for the duration of the COVID-19 declaration under Section 564(b)(1) of the Act, 21 U.S.C. section 360bbb-3(b)(1), unless the authorization is terminated or revoked.     Resp Syncytial Virus by PCR NEGATIVE NEGATIVE Final    Comment: (NOTE) Fact Sheet for Patients: BloggerCourse.com  Fact Sheet for Healthcare Providers: SeriousBroker.it  This test is not yet approved or cleared by the [place] FDA and has been authorized for detection and/or diagnosis of SARS-CoV-2 by FDA under an Emergency Use Authorization (EUA). This EUA will remain in effect (meaning this test can be  used) for the duration of the COVID-19 declaration under Section 564(b)(1) of the Act, 21 U.S.C. section 360bbb-3(b)(1), unless the authorization is terminated or revoked.  Performed at [facility], [address] [place], [place] [address]   Blood culture (routine single)     Status: None   Collection Time: 08/29/22  7:37 AM   Specimen: BLOOD  Result Value Ref Range Status   Specimen Description BLOOD  LEFT [facility]  Final   Special Requests   Final    BOTTLES DRAWN AEROBIC AND ANAEROBIC Blood Culture adequate volume   Culture   Final    NO GROWTH 5 DAYS Performed at [facility], [address] [place], [place] [address]    Report Status 09/03/2022 FINAL  Final  Urine Culture     Status: None   Collection Time: 08/29/22  9:06 AM   Specimen: In/Out Cath Urine  Result Value Ref Range Status   Specimen Description   Final    IN/OUT CATH URINE Performed at [facility], [address], [place], [place] [address]    Special Requests   Final    NONE Performed  at [facility], [address], [place], [place] [address]    Culture   Final    NO GROWTH Performed at [facility], [address], [place], [place] [address]    Report Status 08/30/2022 FINAL  Final    Coagulation Studies: No results for input(s): "LABPROT", "INR" in the last 72 hours.  Urinalysis: No results for input(s): "COLORURINE", "LABSPEC", "PHURINE", "GLUCOSEU", "HGBUR", "BILIRUBINUR", "KETONESUR", "PROTEINUR", "UROBILINOGEN", "NITRITE", "LEUKOCYTESUR" in the last 72 hours.  Invalid input(s): "APPERANCEUR"    Imaging: US SCROTUM W/DOPPLER  Result Date: 03/03/2023 CLINICAL DATA:  Scrotal pain and swelling. EXAM: SCROTAL ULTRASOUND DOPPLER ULTRASOUND OF THE TESTICLES TECHNIQUE: Complete ultrasound examination of the testicles, epididymis, and other scrotal structures was performed. Color and spectral Doppler ultrasound were also utilized to evaluate blood flow to  the testicles. COMPARISON:  None Available. FINDINGS: Right testicle Measurements: 3.2 cm x 2.3 cm x 1.9 cm. No mass or microlithiasis visualized. Left testicle Measurements: 3.1 cm x 2.2 cm x 2.2 cm. No mass or microlithiasis visualized. Right epididymis:  Normal in size and appearance. Left epididymis:  Normal in size and appearance. Hydrocele:  None visualized. Varicocele:  None visualized. Pulsed Doppler interrogation of both testes demonstrates normal low resistance arterial and venous waveforms bilaterally. Other: Diffuse scrotal edema is noted. IMPRESSION: 1. Bilateral scrotal edema. Electronically Signed   By: Aram Candela M.D.   On: 03/03/2023 23:27   DG Chest 2 View  Result Date: 03/03/2023 CLINICAL DATA:  Bilateral lower extremity edema. EXAM: CHEST - 2 VIEW COMPARISON:  Chest radiograph dated 01/21/2023. FINDINGS: There is background of chronic interstitial coarsening. There is mild cardiomegaly with mild interstitial edema. Trace right pleural effusion suspected. No pneumothorax. Median sternotomy wires. No acute osseous pathology. Degenerative changes of the spine and shoulders. IMPRESSION: Mild cardiomegaly with mild interstitial edema. Electronically Signed   By: Elgie Collard M.D.   On: 03/03/2023 21:40     Medications:     arformoterol  15 mcg Nebulization BID   And   umeclidinium bromide  1 puff Inhalation Daily   carvedilol  12.5 mg Oral BID WC   folic acid  1 mg Oral Daily   furosemide  40 mg Intravenous Q12H   lactulose  20 g Oral BID   magnesium oxide  400 mg Oral BID   multivitamin with minerals  1 tablet Oral Daily   pantoprazole  40 mg Oral Daily   sodium chloride  1 g Oral TID WC   thiamine  100 mg Oral Daily   Or   thiamine  100 mg Intravenous Daily   cyanocobalamin  100 mcg Oral Daily   acetaminophen **OR** acetaminophen, albuterol, LORazepam **OR** LORazepam, morphine injection, ondansetron **OR** ondansetron (ZOFRAN) IV, mouth rinse,  oxyCODONE  Assessment/ Plan:  Mr. Isador Castille is a 77 y.o.  male with past medical conditions including GERD, hypertension, alcoholic liver cirrhosis, hyperlipidemia, CHF, CAD, and atrial fibrillation on Eliquis  Patient has been admitted for Anasarca [R60.1] Acute on chronic congestive heart failure, unspecified heart failure type (HCC) [I50.9]   Hyponatremia likely secondary to increased free water. Sodium on ED arrival 122.   Sodium 126 today.   Continue IV furosemide 40mg  twice daily.   Will also order salt tabs 1g three times daily.    LOS: 1 Tagen Brethauer 7/11/202412:46 PM

## 2023-03-05 NOTE — Plan of Care (Signed)
  Problem: Activity: Goal: Capacity to carry out activities will improve Outcome: Progressing   Problem: Health Behavior/Discharge Planning: Goal: Ability to manage health-related needs will improve Outcome: Progressing   Problem: Clinical Measurements: Goal: Will remain free from infection Outcome: Progressing   Problem: Clinical Measurements: Goal: Respiratory complications will improve Outcome: Progressing   Problem: Elimination: Goal: Will not experience complications related to bowel motility Outcome: Progressing   Problem: Elimination: Goal: Will not experience complications related to urinary retention Outcome: Progressing   Problem: Pain Managment: Goal: General experience of comfort will improve Outcome: Progressing   Problem: Safety: Goal: Ability to remain free from injury will improve Outcome: Progressing

## 2023-03-05 NOTE — Consult Note (Signed)
[name] , MD [address], [address], [place], [place], [address] [address], [address], [place], [place], [address] Phone: [phone]  Fax: [phone]  Consultation  Referring Provider:   Dr Chipper Herb Primary Care Physician:  [facility] Primary Gastroenterologist:  Dr. Timothy Lasso       Reason for Consultation:     Anemia  Date of Admission:  03/03/2023 Date of Consultation:  03/05/2023         HPI:   Kenneth Stevenson is a 77 y.o. male whom I have been consulted for anemia.  He has a history of cirrhosis secondary to alcohol, A-fib, CAD, hypertension CHF COPD Barrett's esophagus.  In April 2020 4 GI was consulted for concern of upper GI bleeding with melena.  As per Dr. Ferd Hibbs note in January 2023 [facility] no evidence of varices colonoscopy in February 2023 was normal.  Next Repeat EGD in April 2024 by Dr. Timothy Lasso showed mild PHG features of Barrett's esophagus no evidence of esophageal varices.   The patient is on Eliquis presented to the ER with fluid overload increased swelling in his legs started on IV Lasix no overt blood loss noted I been asked see the patient for a drop in hb 1 month back of 8 g and today 7.5 g.  MCV is 105.  B12, iron studies normal.  Denies any nose bleeds, hematemesis, melena, hematochezia, blood in urine or stool  No abdominal pain. No complaints.   Past Medical History:  Diagnosis Date   Atrial fibrillation (HCC)    CHF (congestive heart failure) (HCC)    Cirrhosis (HCC)    COPD (chronic obstructive pulmonary disease) (HCC)    Hypertension    S/P CABG x 3     Past Surgical History:  Procedure Laterality Date   ESOPHAGOGASTRODUODENOSCOPY (EGD) WITH PROPOFOL N/A 12/03/2022   Procedure: ESOPHAGOGASTRODUODENOSCOPY (EGD) WITH PROPOFOL;  Surgeon: Jaynie Collins, DO;  Location: [facility] ENDOSCOPY;  Service: Gastroenterology;  Laterality: N/A;    Prior to Admission medications   Medication Sig Start Date End Date Taking? Authorizing  Provider  acetaminophen (TYLENOL) 325 MG tablet Take 975 mg by mouth every 6 (six) hours as needed for mild pain.   Yes [provider]  albuterol (PROVENTIL HFA;VENTOLIN HFA) 108 (90 Base) MCG/ACT inhaler Inhale 1 puff into the lungs every 6 (six) hours as needed for wheezing or shortness of breath.   Yes [provider]  alendronate (FOSAMAX) 70 MG tablet Take 70 mg by mouth once a week. 01/05/23  Yes [provider]  allopurinol (ZYLOPRIM) 100 MG tablet Take 200 mg by mouth daily.   Yes [provider]  atorvastatin (LIPITOR) 80 MG tablet Take 40 mg by mouth at bedtime. 01/29/23  Yes [provider]  Calcium Carb-Cholecalciferol 600-10 MG-MCG TABS Take 1 tablet by mouth 2 (two) times daily with a meal. 01/29/23  Yes [provider]  carvedilol (COREG) 12.5 MG tablet Take 12.5 mg by mouth 2 (two) times daily with a meal.   Yes [provider]  cyanocobalamin 100 MCG tablet Take 1 tablet by mouth daily. 06/24/05  Yes [provider]  Emollient (EUCERIN) lotion Apply 1 Application topically as needed for dry skin.   Yes [provider]  folic acid (FOLVITE) 1 MG tablet Take 1 tablet (1 mg total) by mouth daily. 01/26/23  Yes [name], MD  guaiFENesin (MUCINEX) 600 MG 12 hr tablet Take 600 mg by mouth 2 (two) times  daily.   Yes [provider]  hydrOXYzine (VISTARIL) 25 MG capsule Take 25 mg by mouth every evening.   Yes [provider]  lactulose (CHRONULAC) 10 GM/15ML solution Take 30 mLs (20 g total) by mouth 2 (two) times daily. 01/25/23  Yes [name], MD  magnesium oxide (MAG-OX) 400 MG tablet Take 400 mg by mouth 2 (two) times daily. 09/16/22  Yes [provider]  Multiple Vitamin (MULTIVITAMIN WITH MINERALS) TABS tablet Take 1 tablet by mouth daily. 01/26/23  Yes [name], MD  naltrexone (DEPADE) 50 MG tablet Take 50 mg by mouth daily.   Yes [provider]  omeprazole (PRILOSEC)  20 MG capsule Take 20 mg by mouth daily. Per Pt   Yes [provider]  potassium chloride SA (KLOR-CON M) 20 MEQ tablet Take 1 tablet (20 mEq total) by mouth daily. 01/25/23  Yes [name], MD  sodium chloride (OCEAN) 0.65 % SOLN nasal spray Place 1 spray into both nostrils as needed for congestion.   Yes [provider]  spironolactone (ALDACTONE) 25 MG tablet Take 50 mg by mouth daily. 02/05/23  Yes [provider]  thiamine (VITAMIN B1) 100 MG tablet Take 1 tablet (100 mg total) by mouth daily. 01/26/23  Yes [name], MD  Tiotropium Bromide-Olodaterol (STIOLTO RESPIMAT) 2.5-2.5 MCG/ACT AERS Inhale 2 each into the lungs daily.   Yes [provider]  torsemide (DEMADEX) 20 MG tablet Take 1 tablet (20 mg total) by mouth daily. 01/26/23  Yes [name], MD  apixaban (ELIQUIS) 5 MG TABS tablet Take 5 mg by mouth daily. Per Patient Patient not taking: Reported on 03/04/2023    [provider]  pyridOXINE (VITAMIN B-6) 100 MG tablet Take 100 mg by mouth daily. Patient not taking: Reported on 03/04/2023    [provider]    Family History  Problem Relation Age of Onset   Dementia Sister      Social History   Tobacco Use   Smoking status: Never   Smokeless tobacco: Never  Substance Use Topics   Alcohol use: Yes    Comment: alcoholic   Drug use: No    Allergies as of 03/03/2023 - Review Complete 03/03/2023  Allergen Reaction Noted   Simvastatin     Empagliflozin Other (See Comments) 11/15/2021    Review of Systems:    All systems reviewed and negative except where noted in HPI.   Physical Exam:  Vital signs in last 24 hours: Temp:  [98.1 F (36.7 C)-98.7 F (37.1 C)] 98.2 F (36.8 C) (07/11 0733) Pulse Rate:  [70-91] 70 (07/11 0733) Resp:  [16-20] 20 (07/11 0733) BP: (97-117)/(50-67) 104/56 (07/11 0733) SpO2:  [84 %-94 %] 92 % (07/11 0747) Weight:  [75.3 kg] 75.3 kg (07/11 0348) Last BM Date : 03/04/23 General:   Pleasant,  cooperative in NAD Head:  Normocephalic and atraumatic. Eyes:   No icterus.   Conjunctiva pink. PERRLA. Ears:  Normal auditory acuity. Neck:  Supple; no masses or thyroidomegaly Lungs: Respirations even and unlabored. Lungs clear to auscultation bilaterally.   No wheezes, crackles, or rhonchi.  Heart: s1.s2 +  Without murmur, clicks, rubs or gallops Abdomen:  Soft, nondistended, nontender. Normal bowel sounds. No appreciable masses or hepatomegaly.  No rebound or guarding.  Neurologic:  Alert and oriented x3;  grossly normal neurologically. Skin:  Intact without significant lesions or rashes. Cervical Nodes:  No significant cervical adenopathy. Psych:  Alert and cooperative. Normal affect.  LAB RESULTS: Recent Labs  03/03/23 2114 03/04/23 0732 03/04/23 1124 03/04/23 2001 03/05/23 0450 03/05/23 0451  WBC 4.3 4.5  --   --  4.5  --   HGB 8.0* 7.7*   < > 7.8* 7.5* 7.5*  HCT 23.1* 21.7*  --   --  20.9*  --   PLT 85* 72*  --   --  65*  --    < > = values in this interval not displayed.   BMET Recent Labs    03/03/23 2114 03/04/23 0732 03/05/23 0450  NA 122* 127* 126*  K 3.9 3.6 3.5  CL 92* 95* 94*  CO2 22 21* 26  GLUCOSE 107* 90 122*  BUN 13 12 15   CREATININE 0.87 0.82 1.06  CALCIUM 8.8* 8.7* 8.5*   LFT Recent Labs    03/03/23 2114  PROT 8.0  ALBUMIN 3.2*  AST 35  ALT 17  ALKPHOS 148*  BILITOT 4.9*   PT/INR No results for input(s): "LABPROT", "INR" in the last 72 hours.  STUDIES: US SCROTUM W/DOPPLER  Result Date: 03/03/2023 CLINICAL DATA:  Scrotal pain and swelling. EXAM: SCROTAL ULTRASOUND DOPPLER ULTRASOUND OF THE TESTICLES TECHNIQUE: Complete ultrasound examination of the testicles, epididymis, and other scrotal structures was performed. Color and spectral Doppler ultrasound were also utilized to evaluate blood flow to the testicles. COMPARISON:  None Available. FINDINGS: Right testicle Measurements: 3.2 cm x 2.3 cm x 1.9 cm. No mass or microlithiasis  visualized. Left testicle Measurements: 3.1 cm x 2.2 cm x 2.2 cm. No mass or microlithiasis visualized. Right epididymis:  Normal in size and appearance. Left epididymis:  Normal in size and appearance. Hydrocele:  None visualized. Varicocele:  None visualized. Pulsed Doppler interrogation of both testes demonstrates normal low resistance arterial and venous waveforms bilaterally. Other: Diffuse scrotal edema is noted. IMPRESSION: 1. Bilateral scrotal edema. Electronically Signed   By: Aram Candela M.D.   On: 03/03/2023 23:27   DG Chest 2 View  Result Date: 03/03/2023 CLINICAL DATA:  Bilateral lower extremity edema. EXAM: CHEST - 2 VIEW COMPARISON:  Chest radiograph dated 01/21/2023. FINDINGS: There is background of chronic interstitial coarsening. There is mild cardiomegaly with mild interstitial edema. Trace right pleural effusion suspected. No pneumothorax. Median sternotomy wires. No acute osseous pathology. Degenerative changes of the spine and shoulders. IMPRESSION: Mild cardiomegaly with mild interstitial edema. Electronically Signed   By: Elgie Collard M.D.   On: 03/03/2023 21:40      Impression / Plan:   Kenneth Stevenson is a 77 y.o. y/o male with history of alcoholic liver cirrhosis no evidence of esophageal varices last endoscopy in April 2024 by Dr. Timothy Lasso.  Port will hypertensive gastropathy noted no overt blood loss reported by patient.  Mild drop in hemoglobin from baseline elevated MCV.  I have been consulted for the anemia with no overt blood loss I would not recommend any further GI evaluation although I did suggest to Dr. Chipper Herb that if the MCV is elevated and he has a chronic anemia to consider if myelodysplastic syndrome would need to be ruled out.  Could also be related to bone marrow suppression from his alcohol.  Oncology has been consulted.  Thank you for involving me in the care of this patient.      LOS: 1 day   [name], MD  03/05/2023, 11:33 AM

## 2023-03-05 NOTE — Progress Notes (Signed)
  Progress Note   Patient: Kenneth Stevenson VWU:[id number] DOB: 1946-08-01 DOA: 03/03/2023     1 DOS: the patient was seen and examined on 03/05/2023   Brief hospital course: Kenneth Stevenson is a 77 y.o. male with medical history significant for alcohol abuse, cirrhosis, HTN, , CAD status post CABG, CHF, A-fib on Eliquis, who presents to the ED with fluid overload.  He is had increasing swelling in his legs and in the past day it is now up to his scrotum.  Increase edema appears to be secondary to congestive heart failure exacerbation.  He was started on IV Lasix.   Principal Problem:   Anasarca Active Problems:   Hyponatremia   Alcoholic cirrhosis of liver with ascites (HCC)   Atrial fibrillation, chronic (HCC)   Essential hypertension   COPD (chronic obstructive pulmonary disease) (HCC)   Acute on chronic diastolic CHF (congestive heart failure) (HCC)   Pancytopenia (HCC)   Metabolic acidosis   Assessment and Plan: Anasarca Acute congestive heart failure with preserved ejection fraction, 60% -Echocardiogram in May with EF of 60%.   Patient started on IV Lasix, appears to be diuresing well.   Edema is improving, renal function still normal.  Continue IV Lasix.   Hyponatremia:  Metabolic acidosis. Sodium level still low at 126, started fluid restriction.  Appreciate nephrology consult.  Metabolic acidosis resolved.   Alcohol abuse Alcoholic cirrhosis (HCC):  Discussed with the patient and family, patient is adamant that he is not ready to quit alcohol drinking.  Long-term prognosis is poor.   CAD (coronary artery disease):  Minimally elevated troponin,   Atrial fibrillation, chronic (HCC): On Coreg. Eliquis on hold due to worsening anemia.    Pancytopenia. Patient hemoglobin had dropped, held anticoagulation.   B12 elevated, iron level normal.  Patient has no apparent bleeding.  Obtain consult from hematology/oncology to consider myelodysplastic syndrome.   Essential  hypertension Coreg.   COPD (chronic obstructive pulmonary disease) (HCC): No wheezing -bronchodilators   Gout -Allopurinol   Barrett's esophagus -Protonix       Subjective:  Patient feels better today, denies any short of breath.  Physical Exam: Vitals:   03/05/23 0348 03/05/23 0733 03/05/23 0747 03/05/23 1206  BP: (!) 97/50 (!) 104/56  (!) 105/55  Pulse: 85 70  88  Resp: 16 20  (!) 25  Temp: 98.7 F (37.1 C) 98.2 F (36.8 C)  98.7 F (37.1 C)  TempSrc:      SpO2: 91% 91% 92% 92%  Weight: 75.3 kg      General exam: Appears calm and comfortable  Respiratory system: Clear to auscultation. Respiratory effort normal. Cardiovascular system: S1 & S2 heard, RRR. No JVD, murmurs, rubs, gallops or clicks. No pedal edema. Gastrointestinal system: Abdomen is nondistended, soft and nontender. No organomegaly or masses felt. Normal bowel sounds heard. Central nervous system: Alert and oriented. No focal neurological deficits. Extremities: Symmetric 5 x 5 power. Skin: No rashes, lesions or ulcers Psychiatry: Judgement and insight appear normal. Mood & affect appropriate.    Data Reviewed:  Lab results reviewed.  Family Communication: Sister and wife updated at bedside.  Disposition: Status is: Inpatient Remains inpatient appropriate because: Severity of disease, IV treatment.     Time spent: 35 minutes  Author: Marrion Coy, MD 03/05/2023 3:25 PM  For on call review [email or web address].

## 2023-03-05 NOTE — Consult Note (Signed)
[facility]  [phone] [phone]  ID: Kenneth Stevenson OB: 11-13-1945  MR#: [id number]  AOZ#:[id number]  Patient Care Team: [facility] as PCP - General (General Practice)  CHIEF COMPLAINT: Anemia and thrombocytopenia.  INTERVAL HISTORY: Patient is a 77 year old male with a longstanding history of anemia and thrombocytopenia likely secondary to alcohol abuse.  He recently presented to the emergency room with CHF exacerbation.  He currently feels improved since admission.  He has no neurologic complaints.  He denies any recent fevers or illnesses.  He has a good appetite and denies weight loss.  He has no chest pain, shortness of breath, cough, or hemoptysis.  He denies any nausea, vomiting, constipation, or diarrhea.  He has no melena or hematochezia.  He has no urinary complaints.  Patient offers no further specific complaints today.  REVIEW OF SYSTEMS:   Review of Systems  Constitutional: Negative.  Negative for fever, malaise/fatigue and weight loss.  Respiratory:  Positive for shortness of breath. Negative for cough and hemoptysis.   Cardiovascular: Negative.  Negative for chest pain.  Gastrointestinal: Negative.  Negative for abdominal pain, blood in stool and melena.  Genitourinary: Negative.  Negative for hematuria.  Musculoskeletal: Negative.  Negative for back pain.  Skin: Negative.  Negative for rash.  Neurological: Negative.  Negative for dizziness, focal weakness, weakness and headaches.  Endo/Heme/Allergies:  Does not bruise/bleed easily.  Psychiatric/Behavioral: Negative.  The patient is not nervous/anxious.     As per HPI. Otherwise, a complete review of systems is negative.  PAST MEDICAL HISTORY: Past Medical History:  Diagnosis Date   Atrial fibrillation (HCC)    CHF (congestive heart failure) (HCC)    Cirrhosis (HCC)    COPD (chronic obstructive pulmonary disease) (HCC)    Hypertension    S/P CABG x 3      PAST SURGICAL HISTORY: Past Surgical History:  Procedure Laterality Date   ESOPHAGOGASTRODUODENOSCOPY (EGD) WITH PROPOFOL N/A 12/03/2022   Procedure: ESOPHAGOGASTRODUODENOSCOPY (EGD) WITH PROPOFOL;  Surgeon: Jaynie Collins, DO;  Location: [facility] ENDOSCOPY;  Service: Gastroenterology;  Laterality: N/A;    FAMILY HISTORY: Family History  Problem Relation Age of Onset   Dementia Sister     ADVANCED DIRECTIVES (Y/N):  @ADVDIR @  HEALTH MAINTENANCE: Social History   Tobacco Use   Smoking status: Never   Smokeless tobacco: Never  Substance Use Topics   Alcohol use: Yes    Comment: alcoholic   Drug use: No     Colonoscopy:  PAP:  Bone density:  Lipid panel:  Allergies  Allergen Reactions   Simvastatin    Empagliflozin Other (See Comments)    Other reaction(s): Tremor, Disorientated, Increased frequency of urination    Current Facility-Administered Medications  Medication Dose Route Frequency Provider Last Rate Last Admin   acetaminophen (TYLENOL) tablet 650 mg  650 mg Oral Q6H PRN Andris Baumann, MD       Or   acetaminophen (TYLENOL) suppository 650 mg  650 mg Rectal Q6H PRN Andris Baumann, MD       albuterol (PROVENTIL) (2.5 MG/3ML) 0.083% nebulizer solution 2.5 mg  2.5 mg Nebulization Q6H PRN Belue, Lendon Collar, RPH       arformoterol (BROVANA) nebulizer solution 15 mcg  15 mcg Nebulization BID Andris Baumann, MD   15 mcg at 03/05/23 0746   And   umeclidinium bromide (INCRUSE ELLIPTA) 62.5 MCG/ACT 1 puff  1 puff Inhalation Daily Andris Baumann, MD   1 puff at 03/05/23  0840   carvedilol (COREG) tablet 12.5 mg  12.5 mg Oral BID WC Lindajo Royal V, MD   12.5 mg at 03/05/23 1610   folic acid (FOLVITE) tablet 1 mg  1 mg Oral Daily Andris Baumann, MD   1 mg at 03/05/23 9604   furosemide (LASIX) injection 40 mg  40 mg Intravenous Q12H Otelia Sergeant, RPH   40 mg at 03/05/23 0832   lactulose (CHRONULAC) 10 GM/15ML solution 20 g  20 g Oral BID Andris Baumann, MD   20  g at 03/05/23 5409   LORazepam (ATIVAN) tablet 1-4 mg  1-4 mg Oral Q1H PRN Andris Baumann, MD   1 mg at 03/04/23 1222   Or   LORazepam (ATIVAN) injection 1-4 mg  1-4 mg Intravenous Q1H PRN Andris Baumann, MD       magnesium oxide (MAG-OX) tablet 400 mg  400 mg Oral BID Lindajo Royal V, MD   400 mg at 03/05/23 8119   morphine (PF) 2 MG/ML injection 2 mg  2 mg Intravenous Q2H PRN Andris Baumann, MD       multivitamin with minerals tablet 1 tablet  1 tablet Oral Daily Andris Baumann, MD   1 tablet at 03/05/23 0833   ondansetron (ZOFRAN) tablet 4 mg  4 mg Oral Q6H PRN Andris Baumann, MD       Or   ondansetron [facility]) injection 4 mg  4 mg Intravenous Q6H PRN Andris Baumann, MD       Oral care mouth rinse  15 mL Mouth Rinse PRN Andris Baumann, MD       oxyCODONE (Oxy IR/ROXICODONE) immediate release tablet 5 mg  5 mg Oral Q4H PRN Andris Baumann, MD       pantoprazole (PROTONIX) EC tablet 40 mg  40 mg Oral Daily Lindajo Royal V, MD   40 mg at 03/05/23 1478   sodium chloride tablet 1 g  1 g Oral TID WC Breeze, Gery Pray, NP   1 g at 03/05/23 1711   thiamine (VITAMIN B1) tablet 100 mg  100 mg Oral Daily Andris Baumann, MD   100 mg at 03/05/23 2956   Or   thiamine (VITAMIN B1) injection 100 mg  100 mg Intravenous Daily Andris Baumann, MD       vitamin B-12 (CYANOCOBALAMIN) tablet 100 mcg  100 mcg Oral Daily Lindajo Royal V, MD   100 mcg at 03/05/23 0833    OBJECTIVE: Vitals:   03/05/23 1206 03/05/23 1612  BP: (!) 105/55 (!) 97/56  Pulse: 88 85  Resp: (!) 25 20  Temp: 98.7 F (37.1 C) 97.7 F (36.5 C)  SpO2: 92% 92%     Body mass index is 23.32 kg/m.    ECOG FS:0 - Asymptomatic  General: Well-developed, well-nourished, no acute distress. Eyes: Pink conjunctiva, anicteric sclera. HEENT: Normocephalic, moist mucous membranes. Lungs: No audible wheezing or coughing. Heart: Regular rate and rhythm. Abdomen: Soft, nontender, no obvious distention. Musculoskeletal: No edema,  cyanosis, or clubbing. Neuro: Alert, answering all questions appropriately. Cranial nerves grossly intact. Skin: No rashes or petechiae noted. Psych: Normal affect. Lymphatics: No cervical, calvicular, axillary or inguinal LAD.   LAB RESULTS:  Lab Results  Component Value Date   NA 126 (L) 03/05/2023   K 3.5 03/05/2023   CL 94 (L) 03/05/2023   CO2 26 03/05/2023   GLUCOSE 122 (H) 03/05/2023   BUN 15 03/05/2023   CREATININE 1.06 03/05/2023  CALCIUM 8.5 (L) 03/05/2023   PROT 8.0 03/03/2023   ALBUMIN 3.2 (L) 03/03/2023   AST 35 03/03/2023   ALT 17 03/03/2023   ALKPHOS 148 (H) 03/03/2023   BILITOT 4.9 (H) 03/03/2023   GFRNONAA >60 03/05/2023   GFRAA >60 05/05/2020    Lab Results  Component Value Date   WBC 4.5 03/05/2023   NEUTROABS 3.6 12/03/2022   HGB 7.9 (L) 03/05/2023   HCT 20.9 (L) 03/05/2023   MCV 105.6 (H) 03/05/2023   PLT 65 (L) 03/05/2023     STUDIES: US SPLEEN (ABDOMEN LIMITED)  Result Date: 03/05/2023 CLINICAL DATA:  Thrombocytopenia, evaluate spleen EXAM: ULTRASOUND ABDOMEN LIMITED COMPARISON:  None Available. FINDINGS: Spleen measures 11.4 x 3.4 x 12.4 cm. Splenic volume is estimated to be 251 cm3. No focal sonographic abnormalities are seen in spleen. IMPRESSION: Spleen measures 11.4 x 3.4 x 12.4 cm. Splenic volume is estimated to be 251 cm3. No focal sonographic abnormalities are seen in spleen. Electronically Signed   By: Ernie Avena M.D.   On: 03/05/2023 17:48   US SCROTUM W/DOPPLER  Result Date: 03/03/2023 CLINICAL DATA:  Scrotal pain and swelling. EXAM: SCROTAL ULTRASOUND DOPPLER ULTRASOUND OF THE TESTICLES TECHNIQUE: Complete ultrasound examination of the testicles, epididymis, and other scrotal structures was performed. Color and spectral Doppler ultrasound were also utilized to evaluate blood flow to the testicles. COMPARISON:  None Available. FINDINGS: Right testicle Measurements: 3.2 cm x 2.3 cm x 1.9 cm. No mass or microlithiasis visualized.  Left testicle Measurements: 3.1 cm x 2.2 cm x 2.2 cm. No mass or microlithiasis visualized. Right epididymis:  Normal in size and appearance. Left epididymis:  Normal in size and appearance. Hydrocele:  None visualized. Varicocele:  None visualized. Pulsed Doppler interrogation of both testes demonstrates normal low resistance arterial and venous waveforms bilaterally. Other: Diffuse scrotal edema is noted. IMPRESSION: 1. Bilateral scrotal edema. Electronically Signed   By: Aram Candela M.D.   On: 03/03/2023 23:27   DG Chest 2 View  Result Date: 03/03/2023 CLINICAL DATA:  Bilateral lower extremity edema. EXAM: CHEST - 2 VIEW COMPARISON:  Chest radiograph dated 01/21/2023. FINDINGS: There is background of chronic interstitial coarsening. There is mild cardiomegaly with mild interstitial edema. Trace right pleural effusion suspected. No pneumothorax. Median sternotomy wires. No acute osseous pathology. Degenerative changes of the spine and shoulders. IMPRESSION: Mild cardiomegaly with mild interstitial edema. Electronically Signed   By: Elgie Collard M.D.   On: 03/03/2023 21:40    ASSESSMENT: Anemia and thrombocytopenia.  PLAN:    Thrombocytopenia: Chronic and unchanged since at least January 2024 although platelet count has trended down slightly to 65.  He does not appear to have significant splenomegaly.  Likely secondary to cirrhosis and bone marrow suppression from alcohol abuse.  No intervention is needed at this time.  Patient does not require bone marrow biopsy.  Have recommended follow-up in the cancer center upon discharge, but patient reports he gets all of his care at the [place] in [place].  Please ensure he has follow-up there. Anemia: Chronic and unchanged.  Iron stores, B12, and folate are within normal limits.  He has no evidence of bleeding.  Hemolysis labs are pending at time of dictation.  Also likely secondary to bone marrow suppression from alcohol abuse.  Monitor.  Patient does not  require transfusion at this time. Shortness of breath/CHF: Significantly improved with IV Lasix.   Appreciate consult, will follow.  Jeralyn Ruths, MD   03/05/2023 5:57 PM

## 2023-03-06 LAB — CBC
HCT: 21.4 % — ABNORMAL LOW (ref 39.0–52.0)
Hemoglobin: 7.5 g/dL — ABNORMAL LOW (ref 13.0–17.0)
MCH: 36.9 pg — ABNORMAL HIGH (ref 26.0–34.0)
MCHC: 35 g/dL (ref 30.0–36.0)
MCV: 105.4 fL — ABNORMAL HIGH (ref 80.0–100.0)
Platelets: 60 10*3/uL — ABNORMAL LOW (ref 150–400)
RBC: 2.03 MIL/uL — ABNORMAL LOW (ref 4.22–5.81)
RDW: 21.8 % — ABNORMAL HIGH (ref 11.5–15.5)
WBC: 4.5 10*3/uL (ref 4.0–10.5)
nRBC: 0 % (ref 0.0–0.2)

## 2023-03-06 LAB — BASIC METABOLIC PANEL
Anion gap: 9 (ref 5–15)
BUN: 16 mg/dL (ref 8–23)
CO2: 26 mmol/L (ref 22–32)
Calcium: 8.9 mg/dL (ref 8.9–10.3)
Chloride: 96 mmol/L — ABNORMAL LOW (ref 98–111)
Creatinine, Ser: 0.94 mg/dL (ref 0.61–1.24)
GFR, Estimated: >60 mL/min (ref >60)
Glucose, Bld: 100 mg/dL — ABNORMAL HIGH (ref 70–99)
Potassium: 3.2 mmol/L — ABNORMAL LOW (ref 3.5–5.1)
Sodium: 131 mmol/L — ABNORMAL LOW (ref 135–145)

## 2023-03-06 LAB — MAGNESIUM: Magnesium: 1.9 mg/dL (ref 1.7–2.4)

## 2023-03-06 MED ORDER — TRAZODONE HCL 50 MG PO TABS
25.0000 mg | ORAL_TABLET | Freq: Every evening | ORAL | Status: DC | PRN
  Administered 2023-03-06: 25 mg via ORAL
  Filled 2023-03-06: qty 1

## 2023-03-06 MED ORDER — POTASSIUM CHLORIDE CRYS ER 20 MEQ PO TBCR
40.0000 meq | EXTENDED_RELEASE_TABLET | ORAL | Status: AC
  Administered 2023-03-06 (×2): 40 meq via ORAL
  Filled 2023-03-06 (×2): qty 2

## 2023-03-06 MED ORDER — POTASSIUM CHLORIDE 10 MEQ/100ML IV SOLN
10.0000 meq | Freq: Once | INTRAVENOUS | Status: AC
  Administered 2023-03-06: 10 meq via INTRAVENOUS
  Filled 2023-03-06: qty 100

## 2023-03-06 MED ORDER — METOLAZONE 2.5 MG PO TABS
2.5000 mg | ORAL_TABLET | Freq: Once | ORAL | Status: AC
  Administered 2023-03-06: 2.5 mg via ORAL
  Filled 2023-03-06: qty 1

## 2023-03-06 NOTE — Progress Notes (Addendum)
Pt is requesting something for sleep. MD Mansy made aware. Will continue to monitor.  Update 2313: MD Mansy made aware. Will continue to monitor.

## 2023-03-06 NOTE — Progress Notes (Signed)
  Progress Note   Patient: Kenneth Stevenson GNF:[id number] DOB: 03-09-1946 DOA: 03/03/2023     2 DOS: the patient was seen and examined on 03/06/2023   Brief hospital course: Terrick Barcellona is a 77 y.o. male with medical history significant for alcohol abuse, cirrhosis, HTN, , CAD status post CABG, CHF, A-fib on Eliquis, who presents to the ED with fluid overload.  He is had increasing swelling in his legs and in the past day it is now up to his scrotum.  Increase edema appears to be secondary to congestive heart failure exacerbation.  He was started on IV Lasix.   Principal Problem:   Anasarca Active Problems:   Hyponatremia   Alcoholic cirrhosis of liver with ascites (HCC)   Atrial fibrillation, chronic (HCC)   Essential hypertension   COPD (chronic obstructive pulmonary disease) (HCC)   Acute on chronic diastolic CHF (congestive heart failure) (HCC)   Pancytopenia (HCC)   Metabolic acidosis   Assessment and Plan: Anasarca Acute congestive heart failure with preserved ejection fraction, 60% -Echocardiogram in May with EF of 60%.   Patient started on IV Lasix, renal function still normal, continue Lasix, give a dose of metolazone.    Hyponatremia:  Metabolic acidosis. Condition improving.   Alcohol abuse Alcoholic cirrhosis (HCC):  Discussed with the patient and family, patient is adamant that he is not ready to quit alcohol drinking.  Long-term prognosis is poor.   CAD (coronary artery disease):  Minimally elevated troponin,   Atrial fibrillation, chronic (HCC): On Coreg. Eliquis on hold due to worsening anemia.     Pancytopenia. Anticoagulation on hold due to drop in hemoglobin.  Patient has been seen by oncology, suspect this is due to liver cirrhosis.  Continue to follow, transfuse as needed..   Essential hypertension Coreg.   COPD (chronic obstructive pulmonary disease) (HCC): No wheezing -bronchodilators   Gout -Allopurinol   Barrett's  esophagus -Protonix       Subjective:  Patient feels well today, short of breath much improved.  Edema improving.  Physical Exam: Vitals:   03/05/23 2027 03/06/23 0516 03/06/23 0726 03/06/23 1115  BP:  (!) 98/54 (!) 105/48 (!) 105/39  Pulse:  83 80 89  Resp:  18 16   Temp:  98 F (36.7 C) 98 F (36.7 C) 97.9 F (36.6 C)  TempSrc:    Oral  SpO2: 91% 92% 96% 97%  Weight:  74.8 kg     General exam: Appears calm and comfortable  Respiratory system: Clear to auscultation. Respiratory effort normal. Cardiovascular system: S1 & S2 heard, RRR. No JVD, murmurs, rubs, gallops or clicks.  Gastrointestinal system: Abdomen is nondistended, soft and nontender. No organomegaly or masses felt. Normal bowel sounds heard. Central nervous system: Alert and oriented. No focal neurological deficits. Extremities: Symmetric 5 x 5 power. Skin: No rashes, lesions or ulcers Psychiatry: Judgement and insight appear normal. Mood & affect appropriate.    Data Reviewed:  Lab results reviewed.  Family Communication: None  Disposition: Status is: Inpatient Remains inpatient appropriate because: Severity of disease, IV treatment.     Time spent: 35 minutes  Author: Marrion Coy, MD 03/06/2023 1:02 PM  For on call review [email or web address].

## 2023-03-06 NOTE — TOC Progression Note (Signed)
Transition of Care [facility]) - Progression Note    Patient Details  Name: Kenneth Stevenson MRN: [id number] Date of Birth: Mar 16, 1946  Transition of Care [facility]) CM/SW Contact  Margarito Liner, LCSW Phone Number: 03/06/2023, 2:41 PM  Clinical Narrative:   CSW continues to follow for discharge needs.  Expected Discharge Plan: Home/Self Care Barriers to Discharge: Continued Medical Work up  Expected Discharge Plan and Services       Living arrangements for the past 2 months: Single Family Home                                       Social Determinants of Health (SDOH) Interventions SDOH Screenings   Food Insecurity: No Food Insecurity (03/04/2023)  Housing: Low Risk  (03/04/2023)  Transportation Needs: No Transportation Needs (03/04/2023)  Utilities: Not At Risk (03/04/2023)  Tobacco Use: Low Risk  (03/04/2023)    Readmission Risk Interventions    03/04/2023   11:13 AM 01/23/2023   11:20 AM  Readmission Risk Prevention Plan  Transportation Screening Complete Complete  PCP or Specialist Appt within 3-5 Days Complete   Social Work Consult for Recovery Care Planning/Counseling Complete   Palliative Care Screening Not Applicable   Medication Review Oceanographer) Complete Complete  PCP or Specialist appointment within 3-5 days of discharge  Complete  SW Recovery Care/Counseling Consult  Complete  Palliative Care Screening  Not Applicable  Skilled Nursing Facility  Not Applicable

## 2023-03-06 NOTE — Progress Notes (Signed)
[facility]  ROUNDING NOTE   Subjective:   Kenneth Stevenson  is a 77 y.o. male with past medical conditions including GERD, hypertension, alcoholic liver cirrhosis, hyperlipidemia, CHF, CAD, and atrial fibrillation on Eliquis. Patient presented to the ED with a swollen scrotum and lower extremities. He has been admitted for Anasarca [R60.1] Acute on chronic congestive heart failure, unspecified heart failure type (HCC) [I50.9]  Patient is known to our practice from previous admission.  Patient says he feels well No as weak Appetite poor but improving  Sodium 131  Objective:  Vital signs in last 24 hours:  Temp:  [97.7 F (36.5 C)-98.1 F (36.7 C)] 97.9 F (36.6 C) (07/12 1115) Pulse Rate:  [80-89] 89 (07/12 1115) Resp:  [16-20] 16 (07/12 0726) BP: (97-134)/(39-63) 105/39 (07/12 1115) SpO2:  [90 %-97 %] 97 % (07/12 1115) Weight:  [74.8 kg] 74.8 kg (07/12 0516)  Weight change: -0.456 kg Filed Weights   03/05/23 0348 03/06/23 0516  Weight: 75.3 kg 74.8 kg    Intake/Output: I/O last 3 completed shifts: In: 1320 [P.O.:1320] Out: 765 [Urine:765]   Intake/Output this shift:  Total I/O In: 480 [P.O.:480] Out: -   Physical Exam: General: NAD  Head: Normocephalic, atraumatic. Moist oral mucosal membranes  Eyes: Anicteric  Lungs:  Clear to auscultation  Heart: Regular rate and rhythm  Abdomen:  Soft, nontender,   Extremities:  1+ peripheral edema. Scrotal edema (Improving)  Neurologic: Nonfocal, moving all four extremities  Skin: No lesions  Access: None    Basic Metabolic Panel: Recent Labs  Lab 03/03/23 2114 03/04/23 0732 03/05/23 0450 03/06/23 0502  NA 122* 127* 126* 131*  K 3.9 3.6 3.5 3.2*  CL 92* 95* 94* 96*  CO2 22 21* 26 26  GLUCOSE 107* 90 122* 100*  BUN 13 12 15 16   CREATININE 0.87 0.82 1.06 0.94  CALCIUM 8.8* 8.7* 8.5* 8.9  MG  --   --  1.8 1.9  PHOS  --   --  3.2  --     Liver Function Tests: Recent Labs  Lab 03/03/23 2114   AST 35  ALT 17  ALKPHOS 148*  BILITOT 4.9*  PROT 8.0  ALBUMIN 3.2*   No results for input(s): "LIPASE", "AMYLASE" in the last 168 hours. Recent Labs  Lab 03/04/23 0143  AMMONIA 27    CBC: Recent Labs  Lab 03/03/23 2114 03/04/23 0732 03/04/23 1124 03/04/23 2001 03/05/23 0450 03/05/23 0451 03/05/23 1255 03/06/23 0502  WBC 4.3 4.5  --   --  4.5  --   --  4.5  HGB 8.0* 7.7*   < > 7.8* 7.5* 7.5* 7.9* 7.5*  HCT 23.1* 21.7*  --   --  20.9*  --   --  21.4*  MCV 107.4* 104.3*  --   --  105.6*  --   --  105.4*  PLT 85* 72*  --   --  65*  --   --  60*   < > = values in this interval not displayed.    Cardiac Enzymes: No results for input(s): "CKTOTAL", "CKMB", "CKMBINDEX", "TROPONINI" in the last 168 hours.  BNP: Invalid input(s): "POCBNP"  CBG: No results for input(s): "GLUCAP" in the last 168 hours.  Microbiology: Results for orders placed or performed during the hospital encounter of 08/29/22  Resp panel by RT-PCR (RSV, Flu A&B, Covid) Anterior Nasal Swab     Status: None   Collection Time: 08/29/22  6:35 AM   Specimen: Anterior Nasal  Swab  Result Value Ref Range Status   SARS Coronavirus 2 by RT PCR NEGATIVE NEGATIVE Final    Comment: (NOTE) SARS-CoV-2 target nucleic acids are NOT DETECTED.  The SARS-CoV-2 RNA is generally detectable in upper respiratory specimens during the acute phase of infection. The lowest concentration of SARS-CoV-2 viral copies this assay can detect is 138 copies/mL. A negative result does not preclude SARS-Cov-2 infection and should not be used as the sole basis for treatment or other patient management decisions. A negative result may occur with  improper specimen collection/handling, submission of specimen other than nasopharyngeal swab, presence of viral mutation(s) within the areas targeted by this assay, and inadequate number of viral copies(<138 copies/mL). A negative result must be combined with clinical observations, patient  history, and epidemiological information. The expected result is Negative.  Fact Sheet for Patients:  BloggerCourse.com  Fact Sheet for Healthcare Providers:  SeriousBroker.it  This test is no t yet approved or cleared by the [place] FDA and  has been authorized for detection and/or diagnosis of SARS-CoV-2 by FDA under an Emergency Use Authorization (EUA). This EUA will remain  in effect (meaning this test can be used) for the duration of the COVID-19 declaration under Section 564(b)(1) of the Act, 21 U.S.C.section 360bbb-3(b)(1), unless the authorization is terminated  or revoked sooner.       Influenza A by PCR NEGATIVE NEGATIVE Final   Influenza B by PCR NEGATIVE NEGATIVE Final    Comment: (NOTE) The Xpert Xpress SARS-CoV-2/FLU/RSV plus assay is intended as an aid in the diagnosis of influenza from Nasopharyngeal swab specimens and should not be used as a sole basis for treatment. Nasal washings and aspirates are unacceptable for Xpert Xpress SARS-CoV-2/FLU/RSV testing.  Fact Sheet for Patients: BloggerCourse.com  Fact Sheet for Healthcare Providers: SeriousBroker.it  This test is not yet approved or cleared by the [place] FDA and has been authorized for detection and/or diagnosis of SARS-CoV-2 by FDA under an Emergency Use Authorization (EUA). This EUA will remain in effect (meaning this test can be used) for the duration of the COVID-19 declaration under Section 564(b)(1) of the Act, 21 U.S.C. section 360bbb-3(b)(1), unless the authorization is terminated or revoked.     Resp Syncytial Virus by PCR NEGATIVE NEGATIVE Final    Comment: (NOTE) Fact Sheet for Patients: BloggerCourse.com  Fact Sheet for Healthcare Providers: SeriousBroker.it  This test is not yet approved or cleared by the [place] FDA  and has been authorized for detection and/or diagnosis of SARS-CoV-2 by FDA under an Emergency Use Authorization (EUA). This EUA will remain in effect (meaning this test can be used) for the duration of the COVID-19 declaration under Section 564(b)(1) of the Act, 21 U.S.C. section 360bbb-3(b)(1), unless the authorization is terminated or revoked.  Performed at [facility], [address] [place], [place] [address]   Blood culture (routine single)     Status: None   Collection Time: 08/29/22  7:37 AM   Specimen: BLOOD  Result Value Ref Range Status   Specimen Description BLOOD  LEFT [facility]  Final   Special Requests   Final    BOTTLES DRAWN AEROBIC AND ANAEROBIC Blood Culture adequate volume   Culture   Final    NO GROWTH 5 DAYS Performed at [facility], [address], [place], [place] [address]    Report Status 09/03/2022 FINAL  Final  Urine Culture     Status: None   Collection Time: 08/29/22  9:06 AM  Specimen: In/Out Cath Urine  Result Value Ref Range Status   Specimen Description   Final    IN/OUT CATH URINE Performed at [facility], [address] [place], [place] [address]    Special Requests   Final    NONE Performed at [facility], [address], [place], [place] [address]    Culture   Final    NO GROWTH Performed at [facility], [address], [place], [place] [address]    Report Status 08/30/2022 FINAL  Final    Coagulation Studies: No results for input(s): "LABPROT", "INR" in the last 72 hours.  Urinalysis: No results for input(s): "COLORURINE", "LABSPEC", "PHURINE", "GLUCOSEU", "HGBUR", "BILIRUBINUR", "KETONESUR", "PROTEINUR", "UROBILINOGEN", "NITRITE", "LEUKOCYTESUR" in the last 72 hours.  Invalid input(s): "APPERANCEUR"    Imaging: US SPLEEN (ABDOMEN LIMITED)  Result Date: 03/05/2023 CLINICAL DATA:  Thrombocytopenia, evaluate spleen EXAM: ULTRASOUND ABDOMEN LIMITED COMPARISON:  None Available.  FINDINGS: Spleen measures 11.4 x 3.4 x 12.4 cm. Splenic volume is estimated to be 251 cm3. No focal sonographic abnormalities are seen in spleen. IMPRESSION: Spleen measures 11.4 x 3.4 x 12.4 cm. Splenic volume is estimated to be 251 cm3. No focal sonographic abnormalities are seen in spleen. Electronically Signed   By: Ernie Avena M.D.   On: 03/05/2023 17:48     Medications:     arformoterol  15 mcg Nebulization BID   And   umeclidinium bromide  1 puff Inhalation Daily   carvedilol  12.5 mg Oral BID WC   folic acid  1 mg Oral Daily   furosemide  40 mg Intravenous Q12H   lactulose  20 g Oral BID   magnesium oxide  400 mg Oral BID   metolazone  2.5 mg Oral Once   multivitamin with minerals  1 tablet Oral Daily   pantoprazole  40 mg Oral Daily   sodium chloride  1 g Oral TID WC   thiamine  100 mg Oral Daily   Or   thiamine  100 mg Intravenous Daily   cyanocobalamin  100 mcg Oral Daily   acetaminophen **OR** acetaminophen, albuterol, LORazepam **OR** LORazepam, morphine injection, ondansetron **OR** ondansetron (ZOFRAN) IV, mouth rinse, oxyCODONE  Assessment/ Plan:  Mr. Daizon Propes is a 77 y.o.  male with past medical conditions including GERD, hypertension, alcoholic liver cirrhosis, hyperlipidemia, CHF, CAD, and atrial fibrillation on Eliquis  Patient has been admitted for Anasarca [R60.1] Acute on chronic congestive heart failure, unspecified heart failure type (HCC) [I50.9]   Hyponatremia likely secondary to increased free water. Sodium on ED arrival 122.   Sodium 131  Continue IV furosemide 40mg  twice daily.   Salt tabs 1g three times daily.    LOS: 2 [place] 7/12/20241:20 PM

## 2023-03-07 DIAGNOSIS — K7031 Alcoholic cirrhosis of liver with ascites: Secondary | ICD-10-CM

## 2023-03-07 LAB — BASIC METABOLIC PANEL
Anion gap: 7 (ref 5–15)
BUN: 17 mg/dL (ref 8–23)
CO2: 27 mmol/L (ref 22–32)
Calcium: 9 mg/dL (ref 8.9–10.3)
Chloride: 96 mmol/L — ABNORMAL LOW (ref 98–111)
Creatinine, Ser: 0.89 mg/dL (ref 0.61–1.24)
GFR, Estimated: >60 mL/min (ref >60)
Glucose, Bld: 94 mg/dL (ref 70–99)
Potassium: 3.5 mmol/L (ref 3.5–5.1)
Sodium: 130 mmol/L — ABNORMAL LOW (ref 135–145)

## 2023-03-07 LAB — CBC
HCT: 22.7 % — ABNORMAL LOW (ref 39.0–52.0)
Hemoglobin: 7.9 g/dL — ABNORMAL LOW (ref 13.0–17.0)
MCH: 37.6 pg — ABNORMAL HIGH (ref 26.0–34.0)
MCHC: 34.8 g/dL (ref 30.0–36.0)
MCV: 108.1 fL — ABNORMAL HIGH (ref 80.0–100.0)
Platelets: 66 10*3/uL — ABNORMAL LOW (ref 150–400)
RBC: 2.1 MIL/uL — ABNORMAL LOW (ref 4.22–5.81)
RDW: 21.7 % — ABNORMAL HIGH (ref 11.5–15.5)
WBC: 4.9 10*3/uL (ref 4.0–10.5)
nRBC: 0 % (ref 0.0–0.2)

## 2023-03-07 LAB — MAGNESIUM: Magnesium: 1.9 mg/dL (ref 1.7–2.4)

## 2023-03-07 MED ORDER — SPIRONOLACTONE 25 MG PO TABS: 25.0000 mg | ORAL_TABLET | Freq: Every day | ORAL | Status: AC

## 2023-03-07 MED ORDER — POTASSIUM CHLORIDE CRYS ER 20 MEQ PO TBCR
40.0000 meq | EXTENDED_RELEASE_TABLET | ORAL | Status: AC
  Administered 2023-03-07 (×2): 40 meq via ORAL
  Filled 2023-03-07 (×2): qty 2

## 2023-03-07 MED ORDER — TORSEMIDE 20 MG PO TABS: 40.0000 mg | ORAL_TABLET | Freq: Every day | ORAL | Status: AC

## 2023-03-07 NOTE — TOC Transition Note (Signed)
Transition of Care [facility]) - CM/SW Discharge Note   Patient Details  Name: Kenneth Stevenson MRN: [id number] Date of Birth: 12-06-1945  Transition [facility] Care [facility]) CM/SW Contact:  Bing Quarry, RN Phone Number: 03/07/2023, 10:20 AM   Clinical Narrative:  7/13: Discharge orders are in. [facility] notified via VM at 1020 am of patient discharge .  Gabriel Cirri MSN RN CM  Transitions of Care Department [facility] [phone] Weekends Only      Final next level of care: Home/Self Care Barriers to Discharge: Barriers Resolved   Patient Goals and CMS Choice      Discharge Placement                         Discharge Plan and Services Additional resources added to the After Visit Summary for                  DME Arranged: N/A DME Agency: NA       HH Arranged: NA HH Agency: NA        Social Determinants of Health (SDOH) Interventions SDOH Screenings   Food Insecurity: No Food Insecurity (03/04/2023)  Housing: Low Risk  (03/04/2023)  Transportation Needs: No Transportation Needs (03/04/2023)  Utilities: Not At Risk (03/04/2023)  Tobacco Use: Low Risk  (03/04/2023)     Readmission Risk Interventions    03/04/2023   11:13 AM 01/23/2023   11:20 AM  Readmission Risk Prevention Plan  Transportation Screening Complete Complete  PCP or Specialist Appt within 3-5 Days Complete   Social Work Consult for Recovery Care Planning/Counseling Complete   Palliative Care Screening Not Applicable   Medication Review Oceanographer) Complete Complete  PCP or Specialist appointment within 3-5 days of discharge  Complete  SW Recovery Care/Counseling Consult  Complete  Palliative Care Screening  Not Applicable  Skilled Nursing Facility  Not Applicable

## 2023-03-07 NOTE — Discharge Summary (Signed)
Physician Discharge Summary   Patient: Kenneth Stevenson MRN: [id number] DOB: 04-20-1946  Admit date:     03/03/2023  Discharge date: 03/07/23  Discharge Physician: Marrion Coy   PCP: [facility]   Recommendations at discharge:   Follow-up with PCP in [facility]. Follow-up with GI in the hospital.  Discharge Diagnoses: Principal Problem:   Anasarca Active Problems:   Hyponatremia   Alcoholic cirrhosis of liver with ascites (HCC)   Atrial fibrillation, chronic (HCC)   Essential hypertension   COPD (chronic obstructive pulmonary disease) (HCC)   Acute on chronic diastolic CHF (congestive heart failure) (HCC)   Pancytopenia (HCC)   Metabolic acidosis  Resolved Problems:   * No resolved hospital problems. *  Hospital Course: Kenneth Stevenson is a 77 y.o. male with medical history significant for alcohol abuse, cirrhosis, HTN, , CAD status post CABG, CHF, A-fib on Eliquis, who presents to the ED with fluid overload.  He is had increasing swelling in his legs and in the past day it is now up to his scrotum.  Increase edema appears to be secondary to congestive heart failure exacerbation.  He was started on IV Lasix. Patient received 3 days IV Lasix, diuresed significantly.  Leg edema essentially resolved.  Medically stable to be discharged. Assessment and Plan:  Anasarca Acute congestive heart failure with preserved ejection fraction, 60% -Echocardiogram in May with EF of 60%.   Patient started on IV Lasix, she has been diuresing well.  Condition had improved, leg edema and scrotal edema essentially resolved.  At this point, patient is medically stable to be discharged. However, patient long-term prognosis is very poor.  Continue follow-up with palliative care it with the [facility]. Since the patient is not ready to quit alcohol drinking, he will continue drinking beers, he will have recurrent exacerbation of congestive heart failure until he dies.  This is discussed with  the patient and the family, patient still refused to quit drinking.  Hyponatremia:  Metabolic acidosis. Condition already improving, I have reduced Aldactone dose, increase torsemide dose also placed on fluid restriction.   Alcohol abuse Alcoholic cirrhosis (HCC):  Discussed with the patient and family, patient is adamant that he is not ready to quit alcohol drinking.  Long-term prognosis is poor.   CAD (coronary artery disease):  Minimally elevated troponin,   Atrial fibrillation, chronic (HCC): On Coreg. Eliquis on hold due to worsening anemia.     Pancytopenia. Anticoagulation on hold due to drop in hemoglobin.  Patient has been seen by oncology, suspect this is due to liver cirrhosis.  Hemoglobin still low, but relatively stable.   Essential hypertension Coreg.   COPD (chronic obstructive pulmonary disease) (HCC): No wheezing -bronchodilators   Gout -Allopurinol   Barrett's esophagus -Protonix       Consultants: Oncology Procedures performed: None  Disposition: Home Diet recommendation:  Discharge Diet Orders (From admission, onward)     Start     Ordered   03/07/23 0000  Diet - low sodium heart healthy       Comments: Fluid restriction 1572ml/day   03/07/23 1012           Cardiac diet DISCHARGE MEDICATION: Allergies as of 03/07/2023       Reactions   Simvastatin    Empagliflozin Other (See Comments)   Other reaction(s): Tremor, Disorientated, Increased frequency of urination        Medication List     STOP taking these medications    pyridOXINE 100 MG tablet  Commonly known as: VITAMIN B6       TAKE these medications    acetaminophen 325 MG tablet Commonly known as: TYLENOL Take 975 mg by mouth every 6 (six) hours as needed for mild pain.   albuterol 108 (90 Base) MCG/ACT inhaler Commonly known as: VENTOLIN HFA Inhale 1 puff into the lungs every 6 (six) hours as needed for wheezing or shortness of breath.   alendronate 70 MG  tablet Commonly known as: FOSAMAX Take 70 mg by mouth once a week.   allopurinol 100 MG tablet Commonly known as: ZYLOPRIM Take 200 mg by mouth daily.   atorvastatin 80 MG tablet Commonly known as: LIPITOR Take 40 mg by mouth at bedtime.   Calcium Carb-Cholecalciferol 600-10 MG-MCG Tabs Take 1 tablet by mouth 2 (two) times daily with a meal.   carvedilol 12.5 MG tablet Commonly known as: COREG Take 12.5 mg by mouth 2 (two) times daily with a meal.   cyanocobalamin 100 MCG tablet Take 1 tablet by mouth daily.   Eliquis 5 MG Tabs tablet Generic drug: apixaban Take 5 mg by mouth daily. Per Patient   eucerin lotion Apply 1 Application topically as needed for dry skin.   folic acid 1 MG tablet Commonly known as: FOLVITE Take 1 tablet (1 mg total) by mouth daily.   guaiFENesin 600 MG 12 hr tablet Commonly known as: MUCINEX Take 600 mg by mouth 2 (two) times daily.   hydrOXYzine 25 MG capsule Commonly known as: VISTARIL Take 25 mg by mouth every evening.   lactulose 10 GM/15ML solution Commonly known as: CHRONULAC Take 30 mLs (20 g total) by mouth 2 (two) times daily.   magnesium oxide 400 MG tablet Commonly known as: MAG-OX Take 400 mg by mouth 2 (two) times daily.   multivitamin with minerals Tabs tablet Take 1 tablet by mouth daily.   naltrexone 50 MG tablet Commonly known as: DEPADE Take 50 mg by mouth daily.   omeprazole 20 MG capsule Commonly known as: PRILOSEC Take 20 mg by mouth daily. Per Pt   potassium chloride SA 20 MEQ tablet Commonly known as: KLOR-CON M Take 1 tablet (20 mEq total) by mouth daily.   sodium chloride 0.65 % Soln nasal spray Commonly known as: OCEAN Place 1 spray into both nostrils as needed for congestion.   spironolactone 25 MG tablet Commonly known as: ALDACTONE Take 1 tablet (25 mg total) by mouth daily. What changed: how much to take   Stiolto Respimat 2.5-2.5 MCG/ACT Aers Generic drug: Tiotropium  Bromide-Olodaterol Inhale 2 each into the lungs daily.   thiamine 100 MG tablet Commonly known as: VITAMIN B1 Take 1 tablet (100 mg total) by mouth daily.   torsemide 20 MG tablet Commonly known as: DEMADEX Take 2 tablets (40 mg total) by mouth daily. What changed: how much to take        Follow-up Information     [facility] Follow up in 1 week(s).   Specialty: General Practice Contact information: [address] [place] [address] [phone]                Discharge Exam: Ceasar Mons Weights   03/05/23 0348 03/06/23 0516 03/07/23 0410  Weight: 75.3 kg 74.8 kg 75 kg   General exam: Appears calm and comfortable  Respiratory system: Clear to auscultation. Respiratory effort normal. Cardiovascular system: S1 & S2 heard, RRR. No JVD, murmurs, rubs, gallops or clicks. No pedal edema. Gastrointestinal system: Abdomen is nondistended, soft and nontender.  No organomegaly or masses felt. Normal bowel sounds heard. Central nervous system: Alert and oriented. No focal neurological deficits. Extremities: Symmetric 5 x 5 power. Skin: No rashes, lesions or ulcers Psychiatry: Judgement and insight appear normal. Mood & affect appropriate.    Condition at discharge: fair  The results of significant diagnostics from this hospitalization (including imaging, microbiology, ancillary and laboratory) are listed below for reference.   Imaging Studies: US SPLEEN (ABDOMEN LIMITED)  Result Date: 03/05/2023 CLINICAL DATA:  Thrombocytopenia, evaluate spleen EXAM: ULTRASOUND ABDOMEN LIMITED COMPARISON:  None Available. FINDINGS: Spleen measures 11.4 x 3.4 x 12.4 cm. Splenic volume is estimated to be 251 cm3. No focal sonographic abnormalities are seen in spleen. IMPRESSION: Spleen measures 11.4 x 3.4 x 12.4 cm. Splenic volume is estimated to be 251 cm3. No focal sonographic abnormalities are seen in spleen. Electronically Signed   By: Ernie Avena M.D.   On: 03/05/2023 17:48    US SCROTUM W/DOPPLER  Result Date: 03/03/2023 CLINICAL DATA:  Scrotal pain and swelling. EXAM: SCROTAL ULTRASOUND DOPPLER ULTRASOUND OF THE TESTICLES TECHNIQUE: Complete ultrasound examination of the testicles, epididymis, and other scrotal structures was performed. Color and spectral Doppler ultrasound were also utilized to evaluate blood flow to the testicles. COMPARISON:  None Available. FINDINGS: Right testicle Measurements: 3.2 cm x 2.3 cm x 1.9 cm. No mass or microlithiasis visualized. Left testicle Measurements: 3.1 cm x 2.2 cm x 2.2 cm. No mass or microlithiasis visualized. Right epididymis:  Normal in size and appearance. Left epididymis:  Normal in size and appearance. Hydrocele:  None visualized. Varicocele:  None visualized. Pulsed Doppler interrogation of both testes demonstrates normal low resistance arterial and venous waveforms bilaterally. Other: Diffuse scrotal edema is noted. IMPRESSION: 1. Bilateral scrotal edema. Electronically Signed   By: Aram Candela M.D.   On: 03/03/2023 23:27   DG Chest 2 View  Result Date: 03/03/2023 CLINICAL DATA:  Bilateral lower extremity edema. EXAM: CHEST - 2 VIEW COMPARISON:  Chest radiograph dated 01/21/2023. FINDINGS: There is background of chronic interstitial coarsening. There is mild cardiomegaly with mild interstitial edema. Trace right pleural effusion suspected. No pneumothorax. Median sternotomy wires. No acute osseous pathology. Degenerative changes of the spine and shoulders. IMPRESSION: Mild cardiomegaly with mild interstitial edema. Electronically Signed   By: Elgie Collard M.D.   On: 03/03/2023 21:40    Microbiology: Results for orders placed or performed during the hospital encounter of 08/29/22  Resp panel by RT-PCR (RSV, Flu A&B, Covid) Anterior Nasal Swab     Status: None   Collection Time: 08/29/22  6:35 AM   Specimen: Anterior Nasal Swab  Result Value Ref Range Status   SARS Coronavirus 2 by RT PCR NEGATIVE NEGATIVE Final     Comment: (NOTE) SARS-CoV-2 target nucleic acids are NOT DETECTED.  The SARS-CoV-2 RNA is generally detectable in upper respiratory specimens during the acute phase of infection. The lowest concentration of SARS-CoV-2 viral copies this assay can detect is 138 copies/mL. A negative result does not preclude SARS-Cov-2 infection and should not be used as the sole basis for treatment or other patient management decisions. A negative result may occur with  improper specimen collection/handling, submission of specimen other than nasopharyngeal swab, presence of viral mutation(s) within the areas targeted by this assay, and inadequate number of viral copies(<138 copies/mL). A negative result must be combined with clinical observations, patient history, and epidemiological information. The expected result is Negative.  Fact Sheet for Patients:  BloggerCourse.com  Fact Sheet for Healthcare Providers:  SeriousBroker.it  This test is no t yet approved or cleared by the [place] and  has been authorized for detection and/or diagnosis of SARS-CoV-2 by FDA under an Emergency Use Authorization (EUA). This EUA will remain  in effect (meaning this test can be used) for the duration of the COVID-19 declaration under Section 564(b)(1) of the Act, 21 U.S.C.section 360bbb-3(b)(1), unless the authorization is terminated  or revoked sooner.       Influenza A by PCR NEGATIVE NEGATIVE Final   Influenza B by PCR NEGATIVE NEGATIVE Final    Comment: (NOTE) The Xpert Xpress SARS-CoV-2/FLU/RSV plus assay is intended as an aid in the diagnosis of influenza from Nasopharyngeal swab specimens and should not be used as a sole basis for treatment. Nasal washings and aspirates are unacceptable for Xpert Xpress SARS-CoV-2/FLU/RSV testing.  Fact Sheet for Patients: BloggerCourse.com  Fact Sheet for Healthcare  Providers: SeriousBroker.it  This test is not yet approved or cleared by the [place] FDA and has been authorized for detection and/or diagnosis of SARS-CoV-2 by FDA under an Emergency Use Authorization (EUA). This EUA will remain in effect (meaning this test can be used) for the duration of the COVID-19 declaration under Section 564(b)(1) of the Act, 21 U.S.C. section 360bbb-3(b)(1), unless the authorization is terminated or revoked.     Resp Syncytial Virus by PCR NEGATIVE NEGATIVE Final    Comment: (NOTE) Fact Sheet for Patients: BloggerCourse.com  Fact Sheet for Healthcare Providers: SeriousBroker.it  This test is not yet approved or cleared by the [place] FDA and has been authorized for detection and/or diagnosis of SARS-CoV-2 by FDA under an Emergency Use Authorization (EUA). This EUA will remain in effect (meaning this test can be used) for the duration of the COVID-19 declaration under Section 564(b)(1) of the Act, 21 U.S.C. section 360bbb-3(b)(1), unless the authorization is terminated or revoked.  Performed at [facility], [address] [place], [place] [address]   Blood culture (routine single)     Status: None   Collection Time: 08/29/22  7:37 AM   Specimen: BLOOD  Result Value Ref Range Status   Specimen Description BLOOD  LEFT [facility]  Final   Special Requests   Final    BOTTLES DRAWN AEROBIC AND ANAEROBIC Blood Culture adequate volume   Culture   Final    NO GROWTH 5 DAYS Performed at [facility], [address] [place], [place] [address]    Report Status 09/03/2022 FINAL  Final  Urine Culture     Status: None   Collection Time: 08/29/22  9:06 AM   Specimen: In/Out Cath Urine  Result Value Ref Range Status   Specimen Description   Final    IN/OUT CATH URINE Performed at [facility], [address], [place], [place] [address]    Special  Requests   Final    NONE Performed at [facility], [address] [place], [place] [address]    Culture   Final    NO GROWTH Performed at [facility], [address] [place], [place] [address]    Report Status 08/30/2022 FINAL  Final    Labs: CBC: Recent Labs  Lab 03/03/23 2114 03/04/23 0732 03/04/23 1124 03/05/23 0450 03/05/23 0451 03/05/23 1255 03/06/23 0502 03/07/23 0502  WBC 4.3 4.5  --  4.5  --   --  4.5 4.9  HGB 8.0* 7.7*   < > 7.5* 7.5* 7.9* 7.5* 7.9*  HCT 23.1* 21.7*  --  20.9*  --   --  21.4* 22.7*  MCV 107.4* 104.3*  --  105.6*  --   --  105.4* 108.1*  PLT 85* 72*  --  65*  --   --  60* 66*   < > = values in this interval not displayed.   Basic Metabolic Panel: Recent Labs  Lab 03/03/23 2114 03/04/23 0732 03/05/23 0450 03/06/23 0502 03/07/23 0502  NA 122* 127* 126* 131* 130*  K 3.9 3.6 3.5 3.2* 3.5  CL 92* 95* 94* 96* 96*  CO2 22 21* 26 26 27   GLUCOSE 107* 90 122* 100* 94  BUN 13 12 15 16 17   CREATININE 0.87 0.82 1.06 0.94 0.89  CALCIUM 8.8* 8.7* 8.5* 8.9 9.0  MG  --   --  1.8 1.9 1.9  PHOS  --   --  3.2  --   --    Liver Function Tests: Recent Labs  Lab 03/03/23 2114  AST 35  ALT 17  ALKPHOS 148*  BILITOT 4.9*  PROT 8.0  ALBUMIN 3.2*   CBG: No results for input(s): "GLUCAP" in the last 168 hours.  Discharge time spent: greater than 30 minutes.  Signed: Marrion Coy, MD [facility] 03/07/2023

## 2023-03-07 NOTE — Plan of Care (Signed)
  Problem: Activity: Goal: Capacity to carry out activities will improve Outcome: Progressing   Problem: Clinical Measurements: Goal: Will remain free from infection Outcome: Progressing   Problem: Clinical Measurements: Goal: Respiratory complications will improve Outcome: Progressing   Problem: Activity: Goal: Risk for activity intolerance will decrease Outcome: Progressing   Problem: Pain Managment: Goal: General experience of comfort will improve Outcome: Progressing   Problem: Safety: Goal: Ability to remain free from injury will improve Outcome: Progressing

## 2023-03-07 NOTE — Progress Notes (Signed)
[facility]  ROUNDING NOTE   Subjective:   Na 130.   Objective:  Vital signs in last 24 hours:  Temp:  [97.7 F (36.5 C)-98.3 F (36.8 C)] 97.7 F (36.5 C) (07/13 0746) Pulse Rate:  [70-85] 85 (07/13 0746) Resp:  [16-20] 16 (07/13 0746) BP: (97-112)/(31-64) 107/59 (07/13 0746) SpO2:  [93 %-97 %] 95 % (07/13 0746) Weight:  [75 kg] 75 kg (07/13 0410)  Weight change: 0.181 kg Filed Weights   03/05/23 0348 03/06/23 0516 03/07/23 0410  Weight: 75.3 kg 74.8 kg 75 kg    Intake/Output: I/O last 3 completed shifts: In: 840 [P.O.:840] Out: 2680 [Urine:2680]   Intake/Output this shift:  Total I/O In: -  Out: 620 [Urine:620]  Physical Exam: General: NAD, laying in bed  Head: Normocephalic, atraumatic. Moist oral mucosal membranes  Eyes: Anicteric  Lungs:  Clear to auscultation  Heart: Regular rate and rhythm  Abdomen:  Soft, nontender,   Extremities:  1+ peripheral edema. Scrotal edema   Neurologic: Nonfocal, moving all four extremities  Skin: No lesions  Access: None    Basic Metabolic Panel: Recent Labs  Lab 03/03/23 2114 03/04/23 0732 03/05/23 0450 03/06/23 0502 03/07/23 0502  NA 122* 127* 126* 131* 130*  K 3.9 3.6 3.5 3.2* 3.5  CL 92* 95* 94* 96* 96*  CO2 22 21* 26 26 27   GLUCOSE 107* 90 122* 100* 94  BUN 13 12 15 16 17   CREATININE 0.87 0.82 1.06 0.94 0.89  CALCIUM 8.8* 8.7* 8.5* 8.9 9.0  MG  --   --  1.8 1.9 1.9  PHOS  --   --  3.2  --   --     Liver Function Tests: Recent Labs  Lab 03/03/23 2114  AST 35  ALT 17  ALKPHOS 148*  BILITOT 4.9*  PROT 8.0  ALBUMIN 3.2*   No results for input(s): "LIPASE", "AMYLASE" in the last 168 hours. Recent Labs  Lab 03/04/23 0143  AMMONIA 27    CBC: Recent Labs  Lab 03/03/23 2114 03/04/23 0732 03/04/23 1124 03/05/23 0450 03/05/23 0451 03/05/23 1255 03/06/23 0502 03/07/23 0502  WBC 4.3 4.5  --  4.5  --   --  4.5 4.9  HGB 8.0* 7.7*   < > 7.5* 7.5* 7.9* 7.5* 7.9*  HCT 23.1* 21.7*   --  20.9*  --   --  21.4* 22.7*  MCV 107.4* 104.3*  --  105.6*  --   --  105.4* 108.1*  PLT 85* 72*  --  65*  --   --  60* 66*   < > = values in this interval not displayed.    Cardiac Enzymes: No results for input(s): "CKTOTAL", "CKMB", "CKMBINDEX", "TROPONINI" in the last 168 hours.  BNP: Invalid input(s): "POCBNP"  CBG: No results for input(s): "GLUCAP" in the last 168 hours.  Microbiology: Results for orders placed or performed during the hospital encounter of 08/29/22  Resp panel by RT-PCR (RSV, Flu A&B, Covid) Anterior Nasal Swab     Status: None   Collection Time: 08/29/22  6:35 AM   Specimen: Anterior Nasal Swab  Result Value Ref Range Status   SARS Coronavirus 2 by RT PCR NEGATIVE NEGATIVE Final    Comment: (NOTE) SARS-CoV-2 target nucleic acids are NOT DETECTED.  The SARS-CoV-2 RNA is generally detectable in upper respiratory specimens during the acute phase of infection. The lowest concentration of SARS-CoV-2 viral copies this assay can detect is 138 copies/mL. A negative result does not preclude  SARS-Cov-2 infection and should not be used as the sole basis for treatment or other patient management decisions. A negative result may occur with  improper specimen collection/handling, submission of specimen other than nasopharyngeal swab, presence of viral mutation(s) within the areas targeted by this assay, and inadequate number of viral copies(<138 copies/mL). A negative result must be combined with clinical observations, patient history, and epidemiological information. The expected result is Negative.  Fact Sheet for Patients:  BloggerCourse.com  Fact Sheet for Healthcare Providers:  SeriousBroker.it  This test is no t yet approved or cleared by the [place] FDA and  has been authorized for detection and/or diagnosis of SARS-CoV-2 by FDA under an Emergency Use Authorization (EUA). This EUA will remain  in  effect (meaning this test can be used) for the duration of the COVID-19 declaration under Section 564(b)(1) of the Act, 21 U.S.C.section 360bbb-3(b)(1), unless the authorization is terminated  or revoked sooner.       Influenza A by PCR NEGATIVE NEGATIVE Final   Influenza B by PCR NEGATIVE NEGATIVE Final    Comment: (NOTE) The Xpert Xpress SARS-CoV-2/FLU/RSV plus assay is intended as an aid in the diagnosis of influenza from Nasopharyngeal swab specimens and should not be used as a sole basis for treatment. Nasal washings and aspirates are unacceptable for Xpert Xpress SARS-CoV-2/FLU/RSV testing.  Fact Sheet for Patients: BloggerCourse.com  Fact Sheet for Healthcare Providers: SeriousBroker.it  This test is not yet approved or cleared by the [place] FDA and has been authorized for detection and/or diagnosis of SARS-CoV-2 by FDA under an Emergency Use Authorization (EUA). This EUA will remain in effect (meaning this test can be used) for the duration of the COVID-19 declaration under Section 564(b)(1) of the Act, 21 U.S.C. section 360bbb-3(b)(1), unless the authorization is terminated or revoked.     Resp Syncytial Virus by PCR NEGATIVE NEGATIVE Final    Comment: (NOTE) Fact Sheet for Patients: BloggerCourse.com  Fact Sheet for Healthcare Providers: SeriousBroker.it  This test is not yet approved or cleared by the [place] FDA and has been authorized for detection and/or diagnosis of SARS-CoV-2 by FDA under an Emergency Use Authorization (EUA). This EUA will remain in effect (meaning this test can be used) for the duration of the COVID-19 declaration under Section 564(b)(1) of the Act, 21 U.S.C. section 360bbb-3(b)(1), unless the authorization is terminated or revoked.  Performed at [facility], [address] [place], [place] [address]   Blood  culture (routine single)     Status: None   Collection Time: 08/29/22  7:37 AM   Specimen: BLOOD  Result Value Ref Range Status   Specimen Description BLOOD  LEFT [facility]  Final   Special Requests   Final    BOTTLES DRAWN AEROBIC AND ANAEROBIC Blood Culture adequate volume   Culture   Final    NO GROWTH 5 DAYS Performed at [facility], [address] [place], [place] [address]    Report Status 09/03/2022 FINAL  Final  Urine Culture     Status: None   Collection Time: 08/29/22  9:06 AM   Specimen: In/Out Cath Urine  Result Value Ref Range Status   Specimen Description   Final    IN/OUT CATH URINE Performed at [facility], [address], [place], [place] [address]    Special Requests   Final    NONE Performed at [facility], [address], [place], [place] [address]    Culture   Final  NO GROWTH Performed at [facility], [address] [place], [place] [address]    Report Status 08/30/2022 FINAL  Final    Coagulation Studies: No results for input(s): "LABPROT", "INR" in the last 72 hours.  Urinalysis: No results for input(s): "COLORURINE", "LABSPEC", "PHURINE", "GLUCOSEU", "HGBUR", "BILIRUBINUR", "KETONESUR", "PROTEINUR", "UROBILINOGEN", "NITRITE", "LEUKOCYTESUR" in the last 72 hours.  Invalid input(s): "APPERANCEUR"    Imaging: US SPLEEN (ABDOMEN LIMITED)  Result Date: 03/05/2023 CLINICAL DATA:  Thrombocytopenia, evaluate spleen EXAM: ULTRASOUND ABDOMEN LIMITED COMPARISON:  None Available. FINDINGS: Spleen measures 11.4 x 3.4 x 12.4 cm. Splenic volume is estimated to be 251 cm3. No focal sonographic abnormalities are seen in spleen. IMPRESSION: Spleen measures 11.4 x 3.4 x 12.4 cm. Splenic volume is estimated to be 251 cm3. No focal sonographic abnormalities are seen in spleen. Electronically Signed   By: Ernie Avena M.D.   On: 03/05/2023 17:48     Medications:     arformoterol  15 mcg Nebulization BID   And    umeclidinium bromide  1 puff Inhalation Daily   carvedilol  12.5 mg Oral BID WC   folic acid  1 mg Oral Daily   furosemide  40 mg Intravenous Q12H   lactulose  20 g Oral BID   magnesium oxide  400 mg Oral BID   multivitamin with minerals  1 tablet Oral Daily   pantoprazole  40 mg Oral Daily   sodium chloride  1 g Oral TID WC   thiamine  100 mg Oral Daily   Or   thiamine  100 mg Intravenous Daily   cyanocobalamin  100 mcg Oral Daily   acetaminophen **OR** acetaminophen, albuterol, morphine injection, ondansetron **OR** ondansetron (ZOFRAN) IV, mouth rinse, oxyCODONE, traZODone  Assessment/ Plan:  Mr. Kenneth Stevenson is a 77 y.o.  male with past medical conditions including GERD, hypertension, alcoholic liver cirrhosis, hyperlipidemia, CHF, CAD, and atrial fibrillation on Eliquis  Patient has been admitted for Anasarca [R60.1] Acute on chronic congestive heart failure, unspecified heart failure type (HCC) [I50.9]   Hyponatremia likely secondary to increased free water. Sodium on ED arrival 122.   Fluid restriction  Salt tabs 1g three times daily.    LOS: 3 Kenneth Stevenson 7/13/202412:31 PM

## 2023-03-08 LAB — HAPTOGLOBIN: Haptoglobin: 13 mg/dL — ABNORMAL LOW (ref 34–355)

## 2023-03-13 ENCOUNTER — Encounter: Payer: No Typology Code available for payment source | Admitting: Family

## 2023-03-13 NOTE — Progress Notes (Deleted)
Advanced Heart Failure Clinic Note   Referring Physician: PCP: [facility] PCP-Cardiologist: None   HPI:  Kenneth Stevenson is a 77 y/o male with a history of alcohol abuse, cirrhosis, HTN, , CAD status post CABG, CHF, A-fib on Eliquis   Admitted 01/21/23 due to shortness of breath. Found to have some evidence of fluid overload and significant hyponatremia. Admitting provider consulted nephrology. Per nephrology patient is currently on Lasix drip. No tolvaptan given underlying liver disease. Continue home naltrexone 50 mg daily. Abstain from alcohol. Admitted 03/03/23 due to He is had increasing swelling in his legs and in the past day it is now up to his scrotum. IV diuresed. Poor prognosis due to continued alcohol use. Follows with palliative care at the [place]. Symptoms improved after IV diuresis and he was discharged.  Echo 01/22/23: EF 55-60% along with mild LVH, mild/moderate LAE/RAE and mild/moderate Kenneth/ TR  He presents today for his initial visit with a chief complaint of  Review of Systems: [y] = yes, [ ]  = no   General: Weight gain [ ] ; Weight loss [ ] ; Anorexia [ ] ; Fatigue [ ] ; Fever [ ] ; Chills [ ] ; Weakness [ ]   Cardiac: Chest pain/pressure [ ] ; Resting SOB [ ] ; Exertional SOB [ ] ; Orthopnea [ ] ; Pedal Edema [ ] ; Palpitations [ ] ; Syncope [ ] ; Presyncope [ ] ; Paroxysmal nocturnal dyspnea[ ]   Pulmonary: Cough [ ] ; Wheezing[ ] ; Hemoptysis[ ] ; Sputum [ ] ; Snoring [ ]   GI: Vomiting[ ] ; Dysphagia[ ] ; Melena[ ] ; Hematochezia [ ] ; Heartburn[ ] ; Abdominal pain [ ] ; Constipation [ ] ; Diarrhea [ ] ; BRBPR [ ]   GU: Hematuria[ ] ; Dysuria [ ] ; Nocturia[ ]   Vascular: Pain in legs with walking [ ] ; Pain in feet with lying flat [ ] ; Non-healing sores [ ] ; Stroke [ ] ; TIA [ ] ; Slurred speech [ ] ;  Neuro: Headaches[ ] ; Vertigo[ ] ; Seizures[ ] ; Paresthesias[ ] ;Blurred vision [ ] ; Diplopia [ ] ; Vision changes [ ]   Ortho/Skin: Arthritis [ ] ; Joint pain [ ] ; Muscle pain [ ] ; Joint swelling [ ] ;  Back Pain [ ] ; Rash [ ]   Psych: Depression[ ] ; Anxiety[ ]   Heme: Bleeding problems [ ] ; Clotting disorders [ ] ; Anemia [ ]   Endocrine: Diabetes [ ] ; Thyroid dysfunction[ ]    Past Medical History:  Diagnosis Date   Atrial fibrillation (HCC)    CHF (congestive heart failure) (HCC)    Cirrhosis (HCC)    COPD (chronic obstructive pulmonary disease) (HCC)    Hypertension    S/P CABG x 3     Current Outpatient Medications  Medication Sig Dispense Refill   acetaminophen (TYLENOL) 325 MG tablet Take 975 mg by mouth every 6 (six) hours as needed for mild pain.     albuterol (PROVENTIL HFA;VENTOLIN HFA) 108 (90 Base) MCG/ACT inhaler Inhale 1 puff into the lungs every 6 (six) hours as needed for wheezing or shortness of breath.     alendronate (FOSAMAX) 70 MG tablet Take 70 mg by mouth once a week.     allopurinol (ZYLOPRIM) 100 MG tablet Take 200 mg by mouth daily.     apixaban (ELIQUIS) 5 MG TABS tablet Take 5 mg by mouth daily. Per Patient (Patient not taking: Reported on 03/04/2023)     atorvastatin (LIPITOR) 80 MG tablet Take 40 mg by mouth at bedtime.     Calcium Carb-Cholecalciferol 600-10 MG-MCG TABS Take 1 tablet by mouth 2 (two) times daily with a meal.     carvedilol (  COREG) 12.5 MG tablet Take 12.5 mg by mouth 2 (two) times daily with a meal.     cyanocobalamin 100 MCG tablet Take 1 tablet by mouth daily.     Emollient (EUCERIN) lotion Apply 1 Application topically as needed for dry skin.     folic acid (FOLVITE) 1 MG tablet Take 1 tablet (1 mg total) by mouth daily. 30 tablet 1   guaiFENesin (MUCINEX) 600 MG 12 hr tablet Take 600 mg by mouth 2 (two) times daily.     hydrOXYzine (VISTARIL) 25 MG capsule Take 25 mg by mouth every evening.     lactulose (CHRONULAC) 10 GM/15ML solution Take 30 mLs (20 g total) by mouth 2 (two) times daily. 236 mL 0   magnesium oxide (MAG-OX) 400 MG tablet Take 400 mg by mouth 2 (two) times daily.     Multiple Vitamin (MULTIVITAMIN WITH MINERALS) TABS  tablet Take 1 tablet by mouth daily. 30 tablet 0   naltrexone (DEPADE) 50 MG tablet Take 50 mg by mouth daily.     omeprazole (PRILOSEC) 20 MG capsule Take 20 mg by mouth daily. Per Pt     potassium chloride SA (KLOR-CON M) 20 MEQ tablet Take 1 tablet (20 mEq total) by mouth daily. 30 tablet 0   sodium chloride (OCEAN) 0.65 % SOLN nasal spray Place 1 spray into both nostrils as needed for congestion.     spironolactone (ALDACTONE) 25 MG tablet Take 1 tablet (25 mg total) by mouth daily.     thiamine (VITAMIN B1) 100 MG tablet Take 1 tablet (100 mg total) by mouth daily. 30 tablet 0   Tiotropium Bromide-Olodaterol (STIOLTO RESPIMAT) 2.5-2.5 MCG/ACT AERS Inhale 2 each into the lungs daily.     torsemide (DEMADEX) 20 MG tablet Take 2 tablets (40 mg total) by mouth daily.     No current facility-administered medications for this visit.    Allergies  Allergen Reactions   Simvastatin    Empagliflozin Other (See Comments)    Other reaction(s): Tremor, Disorientated, Increased frequency of urination      Social History   Socioeconomic History   Marital status: Married    Spouse name: Not on file   Number of children: Not on file   Years of education: Not on file   Highest education level: Not on file  Occupational History   Not on file  Tobacco Use   Smoking status: Never   Smokeless tobacco: Never  Substance and Sexual Activity   Alcohol use: Yes    Comment: alcoholic   Drug use: No   Sexual activity: Not on file  Other Topics Concern   Not on file  Social History Narrative   Not on file   Social Determinants of Health   Financial Resource Strain: Not on file  Food Insecurity: No Food Insecurity (03/04/2023)   Hunger Vital Sign    Worried About Running Out of Food in the Last Year: Never true    Ran Out of Food in the Last Year: Never true  Transportation Needs: No Transportation Needs (03/04/2023)   PRAPARE - Administrator, Civil Service (Medical): No    Lack  of Transportation (Non-Medical): No  Physical Activity: Not on file  Stress: Not on file  Social Connections: Not on file  Intimate Partner Violence: Not At Risk (03/04/2023)   Humiliation, Afraid, Rape, and Kick questionnaire    Fear of Current or Ex-Partner: No    Emotionally Abused: No    Physically  Abused: No    Sexually Abused: No      Family History  Problem Relation Age of Onset   Dementia Sister        PHYSICAL EXAM: General:  Well appearing. No respiratory difficulty HEENT: normal Neck: supple. no JVD. Carotids 2+ bilat; no bruits. No lymphadenopathy or thyromegaly appreciated. Cor: PMI nondisplaced. Regular rate & rhythm. No rubs, gallops or murmurs. Lungs: clear Abdomen: soft, nontender, nondistended. No hepatosplenomegaly. No bruits or masses. Good bowel sounds. Extremities: no cyanosis, clubbing, rash, edema Neuro: alert & oriented x 3, cranial nerves grossly intact. moves all 4 extremities w/o difficulty. Affect pleasant.  ECG:   ASSESSMENT & PLAN:  1: NICM with preserved ejection fraction- - due to alcohol use - NYHA class - euvolemic - weighing - Echo 01/22/23: EF 55-60% along with mild LVH, mild/moderate LAE/RAE and mild/moderate Kenneth/ TR - continue  - BNP 03/03/23 was 608.8  2: HTN- - BP - sees PCP at the [facility] 03/07/23 showed sodium 130, potassium 3.5, creatinine 0.89 & GFR >60  3: Chronic hyponatremia- - tolvaptan not indicated due to liver disease  4: Alcohol use-   Delma Freeze, FNP 03/13/23

## 2023-06-29 ENCOUNTER — Encounter: Payer: Self-pay | Admitting: Internal Medicine

## 2024-01-24 DEATH — deceased
